# Patient Record
Sex: Male | Born: 1950 | Race: White | Hispanic: No | Marital: Married | Smoking: Never smoker
Health system: Southern US, Community
[De-identification: ages and names within clinical notes are randomized; demographics above are authoritative.]

## PROBLEM LIST (undated history)

## (undated) DIAGNOSIS — T4145XA Adverse effect of unspecified anesthetic, initial encounter: Secondary | ICD-10-CM

## (undated) DIAGNOSIS — M5136 Other intervertebral disc degeneration, lumbar region: Secondary | ICD-10-CM

## (undated) DIAGNOSIS — M545 Low back pain, unspecified: Secondary | ICD-10-CM

## (undated) DIAGNOSIS — I2510 Atherosclerotic heart disease of native coronary artery without angina pectoris: Secondary | ICD-10-CM

## (undated) DIAGNOSIS — M51369 Other intervertebral disc degeneration, lumbar region without mention of lumbar back pain or lower extremity pain: Secondary | ICD-10-CM

## (undated) DIAGNOSIS — T8859XA Other complications of anesthesia, initial encounter: Secondary | ICD-10-CM

## (undated) DIAGNOSIS — R51 Headache: Secondary | ICD-10-CM

## (undated) DIAGNOSIS — R519 Headache, unspecified: Secondary | ICD-10-CM

## (undated) DIAGNOSIS — E785 Hyperlipidemia, unspecified: Secondary | ICD-10-CM

## (undated) DIAGNOSIS — I10 Essential (primary) hypertension: Secondary | ICD-10-CM

## (undated) HISTORY — PX: LUMBAR LAMINECTOMY: SHX95

## (undated) HISTORY — PX: BACK SURGERY: SHX140

---

## 1999-09-02 ENCOUNTER — Ambulatory Visit: Admission: RE | Admit: 1999-09-02 | Discharge: 1999-09-02 | Payer: Self-pay | Admitting: Family Medicine

## 1999-09-05 ENCOUNTER — Encounter: Payer: Self-pay | Admitting: Family Medicine

## 1999-09-05 ENCOUNTER — Ambulatory Visit (HOSPITAL_COMMUNITY): Admission: RE | Admit: 1999-09-05 | Discharge: 1999-09-05 | Payer: Self-pay | Admitting: Family Medicine

## 2007-02-01 HISTORY — PX: SHOULDER ARTHROSCOPY: SHX128

## 2007-02-07 ENCOUNTER — Ambulatory Visit (HOSPITAL_BASED_OUTPATIENT_CLINIC_OR_DEPARTMENT_OTHER): Admission: RE | Admit: 2007-02-07 | Discharge: 2007-02-07 | Payer: Self-pay | Admitting: Orthopedic Surgery

## 2010-11-15 NOTE — Op Note (Signed)
Jesse Hodges, Jesse Hodges                ACCOUNT NO.:  [id number]   MEDICAL RECORD NO.:  [id number]          PATIENT TYPE:  AMB   LOCATION:  DSC                          FACILITY:  [facility]   PHYSICIAN:  Katy Fitch. Sypher, M.D. DATE OF BIRTH:  Dec 16, 1950   DATE OF PROCEDURE:  02/07/2007  DATE OF DISCHARGE:                               OPERATIVE REPORT   PREOPERATIVE DIAGNOSIS:  Chronic adhesive capsulitis, right shoulder,  following injury eight months prior failing to respond to nonoperative  measures including physical therapy, anti-inflammatory medication, and  steroid injection.   POSTOPERATIVE DIAGNOSIS:  Chronic adhesive capsulitis, right shoulder,  following injury eight months prior failing to respond to nonoperative  measures including physical therapy, anti-inflammatory medication, and  steroid injection.   OPERATION:  1. Examination of right shoulder under anesthesia followed by      arthroscopic evaluation of glenohumeral joint with debridement of      extensive granulation tissue from the glenohumeral joint with      tenolysis of the subscapularis tendon and anterior glenohumeral      capsular ligaments.  2. Subacromial decompression with bursectomy, coracoacromial ligament      relaxation, and acromioplasty.  3. Arthroscopic resection of distal clavicle.   SURGEON:  Katy Fitch. Sypher, M.D.   ASSISTANT:  Marveen Reeks. Dasnoit, P.A.-C.   ANESTHESIA:  General endotracheal anesthesia supplemented by right  interscalene block.   SUPERVISING ANESTHESIOLOGIST:  Janetta Hora. Gelene Mink, M.D.   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  He had a history of straining  his shoulder in December 2007 and developing rather acute pain and  gradual loss of range of motion.  He presented in the spring of 2008 for  evaluation of this predicament and was  enrolled in a therapy program.  He had tried a steroid injection and anti-inflammatory medication  including Celebrex without relief.  He had an intercurrent illness that  made it challenging for him to attend therapy and ultimately he decided  he would prefer to proceed with more aggressive treatment as he did not  feel that therapy was gaining success.   After a detailed informed consent, he is brought to the operating at  this time anticipating arthroscopic evaluation of the right shoulder,  anticipating examination of the shoulder under anesthesia, followed by  arthroscopic debridement of the glenohumeral joint, tenolysis of the  rotator cuff and capsular ligaments, and appropriate subacromial  decompression.  Preoperative x-rays revealed AC degenerative arthritis  and we anticipated resection of the distal clavicle.   PROCEDURE:  Jesse Hodges is brought to the operating room and placed in  the supine position on the operating table.  Following an anesthesia  consult with Dr. Gelene Mink in the holding area, an interscalene block  was placed on the right.  Excellent anesthesia of the right arm and  forequarter were obtained.  He was transferred to room 8 and placed in  the supine position on the operating table and under  Dr. Thornton Dales  direct supervision, general endotracheal anesthesia induced.  He was  carefully positioned in the beach chair position with aid of a torso and  head holder designed for shoulder arthroscopy.  The right arm and  forequarter were prepped with DuraPrep and draped with impervious  arthroscopy drapes.   Examination of the shoulder under anesthesia revealed initial range of  motion elevation combined 140 degrees, external rotation 70 degrees,  internal rotation 20 degrees. After gentle manipulation, we increased  the range of motion to elevation 165 degrees, external rotation of 90  degrees, internal rotation of 70 degrees.   The scope was introduced in  a standard posterior viewing portal.  Abundant adhesive capsulitis granulation tissues were visualized and  documented with the digital camera.  The subscapularis was bound down  and the interval between the biceps and subscapularis was obliterated by  granulation tissue.  The origin of the biceps at the superior labrum was  noted to be intact.  Hyaline articular cartilage surfaces of the glenoid  and humeral head were intact.  The deep surface of the rotator cuff  insertion was normal.  The biceps tendon was normal to the rotator  interval.  A 4.5 mm suction shaver was introduced anteriorly and used to  thoroughly debride the granulation tissue off the rotator cuff.  Hemostasis was achieved with bipolar cautery.   After completion of the intra-articular debridement, the arthroscopic  equipment was removed and placed in the subacromial space.  Through a  posterior viewing portal, the bursa was noted to be hypertrophic and  fibrotic.  After extensive bursectomy, the anatomy of the coracoacromial  arch was studied.  There was a prominent anterolateral acromial spur and  a prominent coracoacromial ligament.   The coracoacromial ligament was released with cutting cautery followed  by debridement of the hypertrophic bursa and tenolysis of the rotator  cuff.  The capsule of the [facility] joint was inspected and found to be  violated inferiorly.  The distal clavicle was prominent and arthritic.  Photographic documentation of the distal clavicle arthritis was  accomplished with a digital camera followed by resection of the distal  15 mm clavicle utilizing an arthroscopic bur from the anterior approach.  After hemostasis was achieved, complete tenolysis of the cuff was  accomplished.  There was no sign of full thickness retracted rotator  cuff tear.   Following completion of the decompression, the arthroscopic portals were  repaired with mattress suture of 3-0 Prolene.  A compressive dressing  was  applied with sterile gauze and paper tape.  There were no apparent  complications.   For aftercare, Jesse Hodges will be discharged to home.  He will return to  our office for follow up in 24 hours to initiate an active range of  motion exercise program.  For aftercare, he is provided a prescription  for Dilaudid 2 mg, 1-2 tablets p.o. q.4-6h. p.r.n. pain, 30 tablets  without refill, Motrin 600 mg one p.o. q.6h. p.r.n. pain with food, and  Keflex 500 mg one p.o. q.8h. x4 days as a prophylactic antibiotic.      Katy Fitch Sypher, M.D.  Electronically Signed     RVS/MEDQ  D:  02/07/2007  T:  02/07/2007  Job:  [id number]   cc:   Chales Salmon. Abigail Miyamoto, M.D.

## 2011-04-17 LAB — POCT HEMOGLOBIN-HEMACUE
Hemoglobin: 16.8
Operator id: 112821

## 2014-07-03 DIAGNOSIS — I2510 Atherosclerotic heart disease of native coronary artery without angina pectoris: Secondary | ICD-10-CM

## 2014-07-03 HISTORY — DX: Atherosclerotic heart disease of native coronary artery without angina pectoris: I25.10

## 2014-07-16 ENCOUNTER — Encounter (HOSPITAL_COMMUNITY): Payer: Self-pay | Admitting: Cardiology

## 2014-07-16 DIAGNOSIS — E785 Hyperlipidemia, unspecified: Secondary | ICD-10-CM

## 2014-07-16 DIAGNOSIS — I2089 Other forms of angina pectoris: Secondary | ICD-10-CM | POA: Diagnosis present

## 2014-07-16 DIAGNOSIS — I208 Other forms of angina pectoris: Secondary | ICD-10-CM | POA: Diagnosis present

## 2014-07-16 DIAGNOSIS — R9430 Abnormal result of cardiovascular function study, unspecified: Secondary | ICD-10-CM | POA: Diagnosis present

## 2014-07-16 HISTORY — DX: Hyperlipidemia, unspecified: E78.5

## 2014-07-16 MED ORDER — SODIUM CHLORIDE 0.9 % IJ SOLN
3.0000 mL | INTRAMUSCULAR | Status: DC | PRN
Start: 2014-07-16 — End: 2014-07-17

## 2014-07-16 MED ORDER — SODIUM CHLORIDE 0.9 % IV SOLN
INTRAVENOUS | Status: DC
  Administered 2014-07-17: 11:00:00 via INTRAVENOUS

## 2014-07-16 MED ORDER — ASPIRIN 81 MG PO CHEW: 81.0000 mg | CHEWABLE_TABLET | ORAL | Status: DC

## 2014-07-16 MED ORDER — SODIUM CHLORIDE 0.9 % IJ SOLN: 3.0000 mL | Freq: Two times a day (BID) | INTRAMUSCULAR | Status: DC

## 2014-07-16 MED ORDER — SODIUM CHLORIDE 0.9 % IV SOLN: 250.0000 mL | INTRAVENOUS | Status: DC | PRN

## 2014-07-16 NOTE — H&P (Signed)
Jesse Hodges  Date of visit:  07/16/2014 DOB:  Apr 02, 1951    Age:  64 yrs. Medical record number:  [id number]     Account number:  [id number] Primary Care Provider: Beverley Fiedler ____________________________ CURRENT DIAGNOSES  1. Chest pain  2. Abnormal result of cardiovascular function study, unspecified  3. Encounter for preprocedural cardiovascular examination  4. Hyperlipidemia ____________________________ ALLERGIES  No Known Allergies ____________________________ MEDICATIONS  1. ibuprofen 200 mg tablet, PRN  2. aspirin 81 mg chewable tablet, 1 p.o. daily ____________________________ HISTORY OF PRESENT ILLNESS Patient is seen for preoperative evaluation prior to the catheterization being done for an abnormal treadmill. This very nice 64 year old male was seen at the request of Dr. Barbaraann Barthel for evaluation of chest discomfort. He has been in good health except for a history of hyperlipidemia predominantly with high triglycerides. He was started on gemfibrozil shortly before Thanksgiving and began to notice episodic discomfort in his chest when he would exert himself at work as well as shortness of breath when walking. He stopped taking the gemfibrozil and feels as if things have gotten better but still has some mild discomfort when he exerts himself. He denies PND, orthopnea or edema. Occasionally he will have some ill described chest discomfort that is described as tightness that occurs at rest that lasts a few seconds. He does not have any family history of heart disease.  He underwent an exercise treadmill test today that was stopped because of angina and ST depression at 5 minutes and 46 seconds into Bruce stage II. He had significant T-wave changes in recovery that resolved. He was started on metoprolol and scheduled for catheterization. ____________________________ PAST HISTORY  Past Medical Illnesses:  hyperlipidemia;  Cardiovascular Illnesses:  no previous history of cardiac  disease.;  Surgical Procedures:  laminectomy lumbar, shoulder repair-rt;  NYHA Classification:  I;  Canadian Angina Classification:  Class 0: Asymptomatic;  Cardiology Procedures-Invasive:  no history of prior cardiac procedures;  Cardiology Procedures-Noninvasive:  treadmill;  LVEF not documented,   ____________________________ CARDIO-PULMONARY TEST DATES EKG Date:  07/13/2014;   ____________________________ FAMILY HISTORY Brother -- Brother alive and well Brother -- Brother alive and well Father -- Father alive and well Mother -- Mother dead, Heart failure, Congestive heart failure Sister -- Sister alive and well Sister -- Sister alive with problem, Leukemia ____________________________ SOCIAL HISTORY Alcohol Use:  occasionally and wine;  Smoking:  never smoked;  Diet:  regular diet;  Lifestyle:  married;  Exercise:  farm work;  Occupation:  Lexicographer for [employer];  Residence:  lives with wife;   ____________________________ REVIEW OF SYSTEMS General:  denies recent weight change, fatique or change in exercise tolerance.  Integumentary:no rashes or new skin lesions. Eyes: denies diplopia, history of glaucoma or visual problems. Ears, Nose, Throat, Mouth:  denies any hearing loss, epistaxis, hoarseness or difficulty speaking. Respiratory: denies dyspnea, cough, wheezing or hemoptysis. Cardiovascular:  please review HPI Abdominal: denies dyspepsia, GI bleeding, constipation, or diarrhea Genitourinary-Male: nocturia  Musculoskeletal:  denies arthritis, venous insufficiency, or muscle weakness Neurological:  denies headaches, stroke, or TIA Psychiatric:  insomnia  ____________________________ PHYSICAL EXAMINATION VITAL SIGNS  Blood Pressure:  124/80 Sitting, Left arm, regular cuff   Pulse:  64/min. Weight:  191.00 lbs. Height:  73"BMI: 25  Constitutional:  pleasant white male in no acute distress Skin:  warm and dry to touch, no apparent skin lesions, or masses noted. Head:   normocephalic, normal hair pattern, no masses or tenderness Eyes:  EOMS Intact, PERRLA, C and  S clear, Funduscopic exam not done. ENT:  ears, nose and throat reveal no gross abnormalities.  Dentition good. Neck:  supple, without massess. No JVD, thyromegaly or carotid bruits. Carotid upstroke normal. Chest:  normal symmetry, clear to auscultation. Cardiac:  regular rhythm, normal S1 and S2, No S3 or S4, no murmurs, gallops or rubs detected. Abdomen:  abdomen soft,non-tender, no masses, no hepatospenomegaly, or aneurysm noted Peripheral Pulses:  the femoral,dorsalis pedis, and posterior tibial pulses are full and equal bilaterally with no bruits auscultated. Extremities & Back:  no deformities, clubbing, cyanosis, erythema or edema observed. Normal muscle strength and tone. Neurological:  no gross motor or sensory deficits noted, affect appropriate, oriented x3. ____________________________ MOST RECENT LIPID PANEL 07/08/14  CHOL TOTL 159 mg/dl, LDL 59 NM, HDL 26 mg/dl, TRIGLYCER 865413 mg/dl,  CHOL/HDL 6.2 (Calc)  ____________________________ IMPRESSIONS/PLAN  1. Chest discomfort consistent with new onset of angina and progressive angina 2. Abnormal cardiovascular function tests 3. Hyperlipidemia  Recommendations:  Plan cardiac catheterization tomorrow. Cardiac catheterization was discussed with the patient including risks of myocardial infarction, death, stroke, bleeding, arrhythmia, dye allergy, or renal insufficiency. He understands and is willing to proceed. Possibility of percutaneous intervention at the same setting was also discussed with the patient including risks. ____________________________ TODAYS ORDERS  1. Chest X-ray PA/Lat: today  2. Complete Blood Count: Today  3. Draw PT/INR: Today  4. PTT: Today  5. Left Heart Cath/Poss PCI: 1 day                       ____________________________ Cardiology Physician:  Darden PalmerW. Spencer Shawntez Dickison, Jr. MD [facility]

## 2014-07-17 ENCOUNTER — Ambulatory Visit (HOSPITAL_COMMUNITY)
Admission: RE | Admit: 2014-07-17 | Discharge: 2014-07-19 | Disposition: A | Discharge status: Home / Self Care | Payer: Federal, State, Local not specified - PPO | Source: Ambulatory Visit | Attending: Cardiology | Admitting: Cardiology

## 2014-07-17 ENCOUNTER — Encounter (HOSPITAL_COMMUNITY): Payer: Self-pay | Admitting: *Deleted

## 2014-07-17 ENCOUNTER — Encounter (HOSPITAL_COMMUNITY)
Admission: RE | Disposition: A | Discharge status: Home / Self Care | Payer: Self-pay | Source: Ambulatory Visit | Attending: Cardiology

## 2014-07-17 DIAGNOSIS — I2584 Coronary atherosclerosis due to calcified coronary lesion: Secondary | ICD-10-CM | POA: Diagnosis not present

## 2014-07-17 DIAGNOSIS — Z791 Long term (current) use of non-steroidal anti-inflammatories (NSAID): Secondary | ICD-10-CM | POA: Insufficient documentation

## 2014-07-17 DIAGNOSIS — I772 Rupture of artery: Secondary | ICD-10-CM | POA: Diagnosis not present

## 2014-07-17 DIAGNOSIS — I25119 Atherosclerotic heart disease of native coronary artery with unspecified angina pectoris: Secondary | ICD-10-CM

## 2014-07-17 DIAGNOSIS — E785 Hyperlipidemia, unspecified: Secondary | ICD-10-CM | POA: Diagnosis present

## 2014-07-17 DIAGNOSIS — I2510 Atherosclerotic heart disease of native coronary artery without angina pectoris: Secondary | ICD-10-CM | POA: Diagnosis present

## 2014-07-17 DIAGNOSIS — R9430 Abnormal result of cardiovascular function study, unspecified: Secondary | ICD-10-CM | POA: Diagnosis present

## 2014-07-17 DIAGNOSIS — I208 Other forms of angina pectoris: Secondary | ICD-10-CM | POA: Diagnosis present

## 2014-07-17 DIAGNOSIS — Z7982 Long term (current) use of aspirin: Secondary | ICD-10-CM | POA: Insufficient documentation

## 2014-07-17 DIAGNOSIS — I25118 Atherosclerotic heart disease of native coronary artery with other forms of angina pectoris: Secondary | ICD-10-CM | POA: Diagnosis not present

## 2014-07-17 DIAGNOSIS — I200 Unstable angina: Secondary | ICD-10-CM | POA: Diagnosis present

## 2014-07-17 DIAGNOSIS — I2089 Other forms of angina pectoris: Secondary | ICD-10-CM | POA: Diagnosis present

## 2014-07-17 DIAGNOSIS — Z955 Presence of coronary angioplasty implant and graft: Secondary | ICD-10-CM

## 2014-07-17 HISTORY — PX: CORONARY ANGIOPLASTY WITH STENT PLACEMENT: SHX49

## 2014-07-17 HISTORY — PX: LEFT HEART CATHETERIZATION WITH CORONARY ANGIOGRAM: SHX5451

## 2014-07-17 HISTORY — DX: Hyperlipidemia, unspecified: E78.5

## 2014-07-17 HISTORY — DX: Atherosclerotic heart disease of native coronary artery without angina pectoris: I25.10

## 2014-07-17 LAB — POCT ACTIVATED CLOTTING TIME: Activated Clotting Time: 380 seconds

## 2014-07-17 LAB — TROPONIN I: TROPONIN I: 0.17 ng/mL — AB (ref <0.031)

## 2014-07-17 SURGERY — LEFT HEART CATHETERIZATION WITH CORONARY ANGIOGRAM
Anesthesia: LOCAL

## 2014-07-17 MED ORDER — ASPIRIN 81 MG PO CHEW: 81.0000 mg | CHEWABLE_TABLET | Freq: Every day | ORAL | Status: DC

## 2014-07-17 MED ORDER — FENTANYL CITRATE 0.05 MG/ML IJ SOLN
INTRAMUSCULAR | Status: AC
  Filled 2014-07-17: qty 2

## 2014-07-17 MED ORDER — TICAGRELOR 90 MG PO TABS: 90.0000 mg | ORAL_TABLET | Freq: Two times a day (BID) | ORAL | Status: DC

## 2014-07-17 MED ORDER — LIDOCAINE HCL (PF) 1 % IJ SOLN
INTRAMUSCULAR | Status: AC
Start: 2014-07-17 — End: 2014-07-17
  Filled 2014-07-17: qty 30

## 2014-07-17 MED ORDER — NITROGLYCERIN IN D5W 200-5 MCG/ML-% IV SOLN: 10.0000 ug/min | INTRAVENOUS | Status: AC

## 2014-07-17 MED ORDER — NITROGLYCERIN 1 MG/10 ML FOR IR/CATH LAB
INTRA_ARTERIAL | Status: AC
  Filled 2014-07-17: qty 10

## 2014-07-17 MED ORDER — TICAGRELOR 90 MG PO TABS
90.0000 mg | ORAL_TABLET | Freq: Two times a day (BID) | ORAL | Status: DC
  Administered 2014-07-18 – 2014-07-19 (×4): 90 mg via ORAL
  Filled 2014-07-17 (×6): qty 1

## 2014-07-17 MED ORDER — METOPROLOL SUCCINATE ER 25 MG PO TB24
25.0000 mg | ORAL_TABLET | Freq: Every day | ORAL | Status: DC
  Administered 2014-07-18 – 2014-07-19 (×2): 25 mg via ORAL
  Filled 2014-07-17 (×2): qty 1

## 2014-07-17 MED ORDER — OXYCODONE-ACETAMINOPHEN 5-325 MG PO TABS: 1.0000 | ORAL_TABLET | ORAL | Status: DC | PRN

## 2014-07-17 MED ORDER — ASPIRIN 81 MG PO CHEW
81.0000 mg | CHEWABLE_TABLET | Freq: Every day | ORAL | Status: DC
  Administered 2014-07-18 – 2014-07-19 (×2): 81 mg via ORAL
  Filled 2014-07-17 (×2): qty 1

## 2014-07-17 MED ORDER — NITROGLYCERIN IN D5W 200-5 MCG/ML-% IV SOLN
INTRAVENOUS | Status: AC
  Administered 2014-07-17: 18:00:00 10 ug
  Filled 2014-07-17: qty 250

## 2014-07-17 MED ORDER — ACETAMINOPHEN 325 MG PO TABS
650.0000 mg | ORAL_TABLET | ORAL | Status: DC | PRN
  Administered 2014-07-18: 650 mg via ORAL
  Filled 2014-07-17: qty 2

## 2014-07-17 MED ORDER — MIDAZOLAM HCL 2 MG/2ML IJ SOLN
INTRAMUSCULAR | Status: AC
  Filled 2014-07-17: qty 2

## 2014-07-17 MED ORDER — NITROGLYCERIN 0.4 MG SL SUBL: 0.4000 mg | SUBLINGUAL_TABLET | SUBLINGUAL | Status: DC | PRN

## 2014-07-17 MED ORDER — SODIUM CHLORIDE 0.9 % IV SOLN: INTRAVENOUS | Status: AC

## 2014-07-17 MED ORDER — BIVALIRUDIN 250 MG IV SOLR
INTRAVENOUS | Status: AC
  Filled 2014-07-17: qty 250

## 2014-07-17 MED ORDER — HEPARIN (PORCINE) IN NACL 2-0.9 UNIT/ML-% IJ SOLN
INTRAMUSCULAR | Status: AC
  Filled 2014-07-17: qty 1500

## 2014-07-17 MED ORDER — ONDANSETRON HCL 4 MG/2ML IJ SOLN: 4.0000 mg | Freq: Four times a day (QID) | INTRAMUSCULAR | Status: DC | PRN

## 2014-07-17 MED ORDER — ATORVASTATIN CALCIUM 40 MG PO TABS: 40.0000 mg | ORAL_TABLET | Freq: Every day | ORAL | Status: DC

## 2014-07-17 MED ORDER — TICAGRELOR 90 MG PO TABS
ORAL_TABLET | ORAL | Status: AC
  Filled 2014-07-17: qty 2

## 2014-07-17 NOTE — Progress Notes (Signed)
Patient with good result with stent done by Dr. Katrinka BlazingSmith earlier.  States he complained of mild chest discomfort late this afternoon that just started.  He looks comfortable.  Plan to get EKG.  Dr. Katrinka BlazingSmith started nitroglycerin just a few minutes ago.  Check troponins.  Darden PalmerW. Spencer Kerrick Miler, Jr. MD [facility]

## 2014-07-17 NOTE — Interval H&P Note (Signed)
History and Physical Interval Note:  07/17/2014 12:21 PM   Patient seen and examined.  No interval change in history and exam since last note.  Stable for procedure.  Darden PalmerW. Spencer Copper Kirtley, Jr. MD [facility]  07/17/2014

## 2014-07-17 NOTE — CV Procedure (Signed)
     PERCUTANEOUS CORONARY INTERVENTION   Jesse Hodges is a 64 y.o. male  INDICATION: High-grade proximal LAD with early positive exercise treadmill test in 4 week history of marked angina with minimal activity   PROCEDURE: 1.  Drug-eluting stent implantation proximal LAD; 2.  Vascade hemostasis device   CONSENT: The risks, benefits, and details of the procedure were explained to the patient. Risks including death, MI, stroke, bleeding, limb ischemia, emergency CABG, renal failure and allergy were described and accepted by the patient.  Informed written consent was obtained prior to proceeding.  PROCEDURE TECHNIQUE:  After Xylocaine anesthesia a 6 French sheath placed by Dr. Wynonia Lawman was used for the intervention..   Coronary guiding shots were made using a 35 XB LAD 6 [place] guide catheter. Antithrombotic therapy, bivalirudin bolus and infusion, was begun and determined to be therapeutic by ACT. Antiplatelet therapy, BRILINTA, was loaded.  A Cougar 0.014 wire was used to cross the stenosis in the proximal LAD.  Predilatation was performed with a 20 mm long by 2.5 mm balloon.  We then deployed a 28 x 3.5 mm Synergy drug-eluting stent at 14 atm.  We then postdilated with a 12 mm x 3.75 Leonard Emerge.  We finished with a 8 mm x 4.0 mm incision emerge at 14 atm.  A nice angiographic result was obtained.  No symptoms occurred.  TIMI grade 3 flow is noted post stenting.  There is some swelling of contrast in the mid LAD beyond the stent related to ectasia in the vessel.  A femoral groin shot was performed and the sheath was removed with hemostasis achieved using a Vascade closure device.  No immediate complications are noted.   EQUIPMENT:  Noted above  CONTRAST:  Total of  100 cc.  COMPLICATIONS:  None.    ANGIOGRAPHIC RESULTS:   Segmental eccentric 95% proximal to mid LAD stenosis reduced to 0% with TIMI grade 3 flow.  Following stenting using a 28 x 3.5 mm Synergy DES postdilated to 4.0  mm.   IMPRESSIONS:  Successful LAD stenting from 95% to 0% with TIMI grade 3 flow   RECOMMENDATION:  Aspirin and BRILINTA for at least a year Discharge in a.m. if stable Statin therapy Dr. Wynonia Lawman to manage patient and follow-up 7-14 days. Phase I cardiac rehabilitation.    Sinclair Grooms, MD 07/17/2014 1:32 PM

## 2014-07-17 NOTE — Discharge Summary (Signed)
Physician Discharge Summary  Patient ID: Jesse Hodges MRN: [id number] DOB/AGE: 63/02/1951 63 y.o.  Admit date: 07/17/2014 Discharge date: 07/17/2014  Primary Physician: Dr. Beverley FiedlerVictoria Rankins  Primary Discharge Diagnosis:  1.  Progressive angina pectoris  Secondary Discharge Diagnosis: 2.  Coronary artery disease with severe stenosis in proximal LAD. 3.  Dyslipidemia  Procedures:  Cardiac catheterization-Dr. Donnie Ahoilley. Percutaneous coronary intervention of the anterior descending with a drug-eluting stent by Dr. Alicia AmelSmith  Hospital Course: This 64 year old male presented with about a 4 week history of exertional substernal discomfort at work that has become progressive.  He initially thought it was due to gemfibrozil and stopped this, but has continued to have discomfort.  An exercise treadmill test done the day prior to admission shows that he only was able to walk and Bruce stage II, and had clinical angina as well as ST depression in the inferolateral leads.  He was brought in for catheterization.  Catheterization was done on an outpatient same day basis.  He had normal left ventricular function with minimal anterior hypokinesis noted.  The left main, right coronary and circumflex coronary arteries were normal.  The left anterior descending had a severe 95% proximal stenosis with some ulceration.  He underwent stenting with a 28 x 3.5 [place] G drug-eluting stent and was post dilated with a 12 mm x 3.75 mm noncompliant balloon.  It was eventually dilated to 4 mm.  Final result was a 0% residual stenosis.  He had some mild discomfort in the afternoon after the catheterization was placed on nitroglycerin.  He had some recurrent pain the next day associated with a mild elevation of his troponin.  Serial troponins were drawn and an EKG showed no changes.  There was very minimal elevation of the troponins, which remained flat.  He was treated with IV nitroglycerin and his symptoms eventually resolved.  He  was seen in consultation by cardiac rehabilitation and ambulated the patient.  The hall without recurrent pain.  He remained pain-free for 24 hours and will be discharged at this time.  It is postulated that he may have had sluggish flow in a side branch of the time of PCI to account for his symptoms.    Discharge Exam: Blood pressure 119/83, pulse 66, temperature 97.8 F (36.6 C), temperature source Oral, resp. rate 18, height 6\' 1"  (1.854 m), weight 86.183 kg (190 lb), SpO2 96 %. Weight: 86.183 kg (190 lb)  Cath site clean and dry, lungs clear  EKG: Normal  Discharge Medications:   Medication List    TAKE these medications        aspirin 81 MG tablet  Take 81 mg by mouth daily.     atorvastatin 40 MG tablet  Commonly known as:  LIPITOR  Take 1 tablet (40 mg total) by mouth daily.     FISH OIL PO  Take 1 capsule by mouth 2 (two) times a week.     metoprolol succinate 25 MG 24 hr tablet  Commonly known as:  TOPROL-XL  Take 1 tablet by mouth daily.     NITROSTAT 0.4 MG SL tablet  Generic drug:  nitroGLYCERIN  Place 0.4 mg under the tongue every 5 (five) minutes as needed for chest pain.     ticagrelor 90 MG Tabs tablet  Commonly known as:  BRILINTA  Take 1 tablet (90 mg total) by mouth 2 (two) times daily.     VITAMIN C PO  Take 1 tablet by mouth 2 (two) times a week.  Followup plans and appointments: Follow up Dr. Donnie Aho in one week.  Time spent with patient to include physician time:  30 minutes   Signed: Darden Palmer. MD [facility] 07/17/2014, 6:21 PM

## 2014-07-17 NOTE — CV Procedure (Signed)
Cardiac Catheterization Report   Jesse Hodges    64 y.o.  male  DOB: 07/31/1950  MRN: [id number]  07/17/2014    PROCEDURE:  Left heart catheterization with selective coronary angiography, left ventriculogram.  INDICATIONS:  New onset of angina with moderate limitation of physical activity with an abnormal treadmill test at Bruce stage II  The risks, benefits, and details of the procedure were explained to the patient.  The patient verbalized understanding and wanted to proceed.  Informed written consent was obtained.  PROCEDURE TECHNIQUE:  After Xylocaine anesthesia a 75F sheath was placed in the right femoral artery with a single anterior needle wall stick.   Left coronary angiography was done using a Judkins L4 guide catheter.  Right coronary angiography was done using a Judkins R4 guide catheter.  A 30 cc ventriculogram was performed in the 30 degree RAO projection.  Tolerated the procedure well.  The digital images were reviewed while the patient was on the table and the interventional cardiologist was consulted.  Arrangements were made to proceed with PCI of the LAD.   CONTRAST:  Total of 60 cc.  ESTIMATED BLOOD LOSS:  Minimal   COMPLICATIONS:  None.    HEMODYNAMICS:  Aortic postcontrast 140/80, LV postcontrast 140 over 2-10.  There was no gradient between the left ventricle and aorta.    ANGIOGRAPHIC DATA:    CORONARY ARTERIES:   Arise and distribute normally.  Right dominant.  Mild coronary calcification is noted.  Left main coronary artery: Normal.  Left anterior descending: Severe somewhat segmental 90% stenosis in the proximal left anterior descending with some ulceration.  Distal vessel is large and free of disease  Circumflex coronary artery: Normal.  Right coronary artery: Dominant vessel which is normal.  LEFT VENTRICULOGRAM:  Performed in the 30 RAO projection.  The aortic and mitral valves are normal. The left ventricle is  normal in size with very minimal anterior hypokinesis. Estimated ejection fraction is 60%..   IMPRESSIONS:  1. Severe single-vessel coronary artery disease affecting the proximal left anterior descending artery 2. Normal left ventricle with very minimal anterior hypokinesis  RECOMMENDATION:  Plan to proceed with PCI of the LAD with a drug-eluting stent per Dr. Katrinka BlazingSmith.Jesse Hodges.   Jesse Hodges, Jr. MD [facility]

## 2014-07-18 DIAGNOSIS — E785 Hyperlipidemia, unspecified: Secondary | ICD-10-CM | POA: Diagnosis not present

## 2014-07-18 DIAGNOSIS — I772 Rupture of artery: Secondary | ICD-10-CM | POA: Diagnosis not present

## 2014-07-18 DIAGNOSIS — I2584 Coronary atherosclerosis due to calcified coronary lesion: Secondary | ICD-10-CM | POA: Diagnosis not present

## 2014-07-18 DIAGNOSIS — I25118 Atherosclerotic heart disease of native coronary artery with other forms of angina pectoris: Secondary | ICD-10-CM | POA: Diagnosis not present

## 2014-07-18 LAB — TROPONIN I
TROPONIN I: 0.67 ng/mL — AB (ref <0.031)
TROPONIN I: 0.71 ng/mL — AB (ref <0.031)
TROPONIN I: 0.62 ng/mL — AB (ref <0.031)

## 2014-07-18 LAB — BASIC METABOLIC PANEL
Anion gap: 7 (ref 5–15)
BUN: 13 mg/dL (ref 6–23)
CHLORIDE: 103 meq/L (ref 96–112)
CO2: 25 mmol/L (ref 19–32)
Calcium: 8.9 mg/dL (ref 8.4–10.5)
Creatinine, Ser: 0.93 mg/dL (ref 0.50–1.35)
GFR, EST NON AFRICAN AMERICAN: 88 mL/min — AB (ref >90)
Glucose, Bld: 104 mg/dL — ABNORMAL HIGH (ref 70–99)
Potassium: 4.1 mmol/L (ref 3.5–5.1)
SODIUM: 135 mmol/L (ref 135–145)

## 2014-07-18 LAB — CBC
HEMATOCRIT: 40.6 % (ref 39.0–52.0)
HEMOGLOBIN: 14.5 g/dL (ref 13.0–17.0)
MCH: 32.2 pg (ref 26.0–34.0)
MCHC: 35.7 g/dL (ref 30.0–36.0)
MCV: 90.2 fL (ref 78.0–100.0)
Platelets: 181 10*3/uL (ref 150–400)
RBC: 4.5 MIL/uL (ref 4.22–5.81)
RDW: 12.7 % (ref 11.5–15.5)
WBC: 7.9 10*3/uL (ref 4.0–10.5)

## 2014-07-18 MED ORDER — ATORVASTATIN CALCIUM 80 MG PO TABS
80.0000 mg | ORAL_TABLET | Freq: Every day | ORAL | Status: DC
  Administered 2014-07-18: 80 mg via ORAL
  Filled 2014-07-18 (×2): qty 1

## 2014-07-18 NOTE — Progress Notes (Signed)
CRITICAL VALUE ALERT  Critical value received:  Troponin 0.67  Date of notification:  07/18/2014  Time of notification:  02:30  Critical value read back:Yes.    Nurse who received alert:  Toney Sangeresa Zinia Innocent RN  MD notified (1st page):  Dr. Casper HarrisonWasqas Qureshi  Time of first page:  02:40  MD notified (2nd page):  Time of second page:  Responding MD:    Time MD responded: 02:44

## 2014-07-18 NOTE — Progress Notes (Signed)
Patient ID: Jesse Hodges, male   DOB: 07/31/1950, 64 y.o.   MRN: [id number]    Subjective:    His discomfort around 6 PM last night improved, but he had recurrent chest discomfort around 3 AM.  There was slight elevation of troponin.  Also complained of slight temperature elevation around the end of the evening.  He feels fine this morning.  No EKG changes are noted today.  Objective:   Temp:  [97.6 F (36.4 C)-99.1 F (37.3 C)] 98.1 F (36.7 C) (01/16 0400) Pulse Rate:  [61-87] 77 (01/16 0400) Resp:  [18-20] 20 (01/16 0400) BP: (104-144)/(73-95) 112/80 mmHg (01/16 0500) SpO2:  [95 %-99 %] 96 % (01/16 0400) Weight:  [190 lb (86.183 kg)-191 lb 9.3 oz (86.9 kg)] 191 lb 9.3 oz (86.9 kg) (01/16 0500) Last BM Date: 07/16/14    Intake/Output Summary (Last 24 hours) at 07/18/14 0736 Last data filed at 07/18/14 0600  Gross per 24 hour  Intake 513.05 ml  Output      2 ml  Net 511.05 ml    Telemetry: Sinus rhythm  Exam:   Lungs: Clear Cardiac: Normal S1, S2, no S3. Cath site is clean and dry  Lab Results:  Basic Metabolic Panel:  Recent Labs Lab 07/18/14 0120  NA 135  K 4.1  CL 103  CO2 25  GLUCOSE 104*  BUN 13  CREATININE 0.93  CALCIUM 8.9   CBC:  Recent Labs Lab 07/18/14 0120  WBC 7.9  HGB 14.5  HCT 40.6  MCV 90.2  PLT 181    Cardiac Enzymes:  Recent Labs Lab 07/17/14 1900 07/18/14 0120  TROPONINI 0.17* 0.67*   ECG:   Medications:   Scheduled Medications: . aspirin  81 mg Oral Daily  . metoprolol succinate  25 mg Oral Daily  . ticagrelor  90 mg Oral BID  PRN Medications:  acetaminophen, nitroGLYCERIN, ondansetron (ZOFRAN) IV, oxyCODONE-acetaminophen   07/17/14 Cath HEMODYNAMICS: Aortic postcontrast 140/80, LV postcontrast 140 over 2-10. There was no gradient between the left ventricle and aorta.   ANGIOGRAPHIC DATA:   CORONARY ARTERIES: Arise and distribute normally. Right dominant. Mild coronary calcification is noted.  Left  main coronary artery: Normal.  Left anterior descending: Severe somewhat segmental 90% stenosis in the proximal left anterior descending with some ulceration. Distal vessel is large and free of disease  Circumflex coronary artery: Normal.  Right coronary artery: Dominant vessel which is normal.  LEFT VENTRICULOGRAM: Performed in the 30 RAO projection. The aortic and mitral valves are normal. The left ventricle is normal in size with very minimal anterior hypokinesis. Estimated ejection fraction is 60%..   IMPRESSIONS:  1. Severe single-vessel coronary artery disease affecting the proximal left anterior descending artery 2. Normal left ventricle with very minimal anterior hypokinesis  RECOMMENDATION: Plan to proceed with PCI of the LAD with a drug-eluting stent per Dr. Katrinka BlazingSmith..   ANGIOGRAPHIC RESULTS: Segmental eccentric 95% proximal to mid LAD stenosis reduced to 0% with TIMI grade 3 flow. Following stenting using a 28 x 3.5 mm Synergy DES postdilated to 4.0 mm.   IMPRESSIONS: Successful LAD stenting from 95% to 0% with TIMI grade 3 flow   RECOMMENDATION: Aspirin and BRILINTA for at least a year   Assessment/Plan    1. CAD - s/p stent to DES to LAD yesterday - post cath labs Hgb 14.5, Cr 0.93. EKG SR with no ischemic changes - had some post procedure chest pain, trop trended to 0.67 overnight without peak.  - on  ASA, toprol 25, brilinta  bid. Start high dose statin.  2.  Hyperlipidemia.  Recommendations:  His discomfort was somewhat concerning post cath.  Will review the films again wonder could he have had a very small side branch  sluggish flow or possibly small side branch occlusion.  We'll keep an additional day and get additional troponins.  Check EKG in the morning.  Darden Palmer. MD [facility] 07/18/2014 7:57 AM

## 2014-07-18 NOTE — Progress Notes (Cosign Needed)
CARDIAC REHAB PHASE I   PRE:  Rate/Rhythm: 74 sinus rhythm  BP:  Supine:    Sitting: 122/80  Standing:    SaO2: 97% ra  MODE:  Ambulation: 550 ft   POST:  Rate/Rhythem: 76 sinus rhythm  BP:  Supine:   Sitting: 120/80  Standing:    SaO2: 98% ra 1046-1115 Pt ambulated in hallways without difficulty, steady gait, asymptomatic.  Pt denies chest pain with ambulation.   Education completed including risk factor modification, low fat-low cholesterol diet, exercise, and medication compliance.  Pt oriented to outpatient cardiac rehab.  [facility]   Pt declined at this time, plans to exercise on his own.   Understanding verbalized     Jesse Hodges, Jesse Hodges

## 2014-07-19 DIAGNOSIS — I25118 Atherosclerotic heart disease of native coronary artery with other forms of angina pectoris: Secondary | ICD-10-CM | POA: Diagnosis not present

## 2014-07-19 LAB — TROPONIN I: Troponin I: 0.6 ng/mL (ref <0.031)

## 2014-07-19 MED ORDER — TICAGRELOR 90 MG PO TABS: 90.0000 mg | ORAL_TABLET | Freq: Two times a day (BID) | ORAL | Status: DC

## 2014-07-19 MED ORDER — ATORVASTATIN CALCIUM 40 MG PO TABS: 40.0000 mg | ORAL_TABLET | Freq: Every day | ORAL | Status: DC

## 2014-07-19 NOTE — Progress Notes (Signed)
Patient ID: Jesse Hodges, male   DOB: 03/13/1951, 64 y.o.   MRN: [id number]    Subjective:    He feels fine today.  No recurrent pain and was able to ambulate.  Objective:   Temp:  [98.2 F (36.8 C)-98.5 F (36.9 C)] 98.2 F (36.8 C) (01/17 21300633) Pulse Rate:  [70-84] 70 (01/17 0633) Resp:  [18] 18 (01/17 0633) BP: (117-141)/(75-81) 117/78 mmHg (01/17 0633) SpO2:  [95 %-98 %] 98 % (01/17 0633) Last BM Date: 07/18/14  Telemetry: Sinus rhythm  Exam: Pleasant male in no acute distress  Lungs: Clear Cardiac: Normal S1, S2, no S3. Cath site is clean and dry  Lab Results:  Basic Metabolic Panel:  Recent Labs Lab 07/18/14 0120  NA 135  K 4.1  CL 103  CO2 25  GLUCOSE 104*  BUN 13  CREATININE 0.93  CALCIUM 8.9   CBC:  Recent Labs Lab 07/18/14 0120  WBC 7.9  HGB 14.5  HCT 40.6  MCV 90.2  PLT 181    Cardiac Enzymes:  Recent Labs Lab 07/18/14 1137 07/18/14 1800 07/18/14 2352  TROPONINI 0.71* 0.62* 0.60*   ECG:   Medications:   Scheduled Medications: . aspirin  81 mg Oral Daily  . atorvastatin  80 mg Oral q1800  . metoprolol succinate  25 mg Oral Daily  . ticagrelor  90 mg Oral BID  PRN Medications: acetaminophen, nitroGLYCERIN, oxyCODONE-acetaminophen   Assessment/Plan    1. CAD - s/p stent to DES to LAD yesterday - post cath labs Hgb 14.5, Cr 0.93. EKG SR with no ischemic changes - had some post procedure chest pain, trop trended to 0.67 overnight without peak.  - on ASA, toprol 25, brilinta 90mg  bid. Start high dose statin.  2.  Hyperlipidemia.  Recommendations:  clinically doing much better and will plan for him to go on home today.  Follow-up in one week.  Call if problems.  Darden PalmerW. Spencer Tilley, Jr. MD [facility] 07/19/2014 10:19 AM

## 2014-07-20 MED FILL — Sodium Chloride IV Soln 0.9%: INTRAVENOUS | Qty: 50 | Status: AC

## 2014-07-20 NOTE — Care Management Note (Signed)
    Page 1 of 1   07/20/2014     12:18:35 PM CARE MANAGEMENT NOTE 07/20/2014  Patient:  Jesse Hodges,Jesse Hodges   Account Number:  [id number]  Date Initiated:  07/20/2014  Documentation initiated by:  Donato SchultzHUTCHINSON,Foy Vanduyne  Subjective/Objective Assessment:   Chest discomfort consistent with new onset of angina and progressive angina, Abnormal cardiovascular function tests, Hyperlipidemia     Action/Plan:   Heart Cath   Anticipated DC Date:  07/19/2014   Anticipated DC Plan:  HOME/SELF CARE         Choice offered to / List presented to:             Status of service:  Completed, signed off Medicare Important Message given?   (If response is "NO", the following Medicare IM given date fields will be blank) Date Medicare IM given:   Medicare IM given by:   Date Additional Medicare IM given:   Additional Medicare IM given by:    Discharge Disposition:  HOME/SELF CARE  Per UR Regulation:    If discussed at Long Length of Stay Meetings, dates discussed:    Comments:  Marria Mathison RN, BSN, MSHL, CCM  Nurse - Case Manager,  (Unit [phone]  07/20/2014 Heart cath - s/p stent to DES to LAD 07/17/2014 Benefits check: Brilinta in progress Insured:  BLUE CROSS BLUE SHIELD/BCBS/[employer] PPO

## 2018-01-28 ENCOUNTER — Other Ambulatory Visit: Payer: Self-pay

## 2018-01-28 ENCOUNTER — Emergency Department: Payer: Medicare Other

## 2018-01-28 ENCOUNTER — Inpatient Hospital Stay
Admission: EM | Admit: 2018-01-28 | Discharge: 2018-01-30 | DRG: 552 | Disposition: A | Discharge status: Other Acute Inpatient Hospital | Payer: Medicare Other | Attending: Internal Medicine | Admitting: Internal Medicine

## 2018-01-28 ENCOUNTER — Encounter: Payer: Self-pay | Admitting: Emergency Medicine

## 2018-01-28 DIAGNOSIS — Z8249 Family history of ischemic heart disease and other diseases of the circulatory system: Secondary | ICD-10-CM

## 2018-01-28 DIAGNOSIS — Z955 Presence of coronary angioplasty implant and graft: Secondary | ICD-10-CM

## 2018-01-28 DIAGNOSIS — R262 Difficulty in walking, not elsewhere classified: Secondary | ICD-10-CM | POA: Diagnosis present

## 2018-01-28 DIAGNOSIS — E785 Hyperlipidemia, unspecified: Secondary | ICD-10-CM | POA: Diagnosis present

## 2018-01-28 DIAGNOSIS — M545 Low back pain, unspecified: Secondary | ICD-10-CM | POA: Diagnosis present

## 2018-01-28 DIAGNOSIS — M5417 Radiculopathy, lumbosacral region: Secondary | ICD-10-CM

## 2018-01-28 DIAGNOSIS — Z7982 Long term (current) use of aspirin: Secondary | ICD-10-CM | POA: Diagnosis not present

## 2018-01-28 DIAGNOSIS — M48061 Spinal stenosis, lumbar region without neurogenic claudication: Secondary | ICD-10-CM | POA: Diagnosis present

## 2018-01-28 DIAGNOSIS — M5116 Intervertebral disc disorders with radiculopathy, lumbar region: Secondary | ICD-10-CM | POA: Diagnosis present

## 2018-01-28 DIAGNOSIS — I10 Essential (primary) hypertension: Secondary | ICD-10-CM | POA: Diagnosis present

## 2018-01-28 DIAGNOSIS — G8929 Other chronic pain: Secondary | ICD-10-CM | POA: Diagnosis present

## 2018-01-28 DIAGNOSIS — Z7902 Long term (current) use of antithrombotics/antiplatelets: Secondary | ICD-10-CM

## 2018-01-28 DIAGNOSIS — M549 Dorsalgia, unspecified: Secondary | ICD-10-CM

## 2018-01-28 DIAGNOSIS — I2510 Atherosclerotic heart disease of native coronary artery without angina pectoris: Secondary | ICD-10-CM | POA: Diagnosis present

## 2018-01-28 DIAGNOSIS — R52 Pain, unspecified: Secondary | ICD-10-CM

## 2018-01-28 HISTORY — DX: Low back pain, unspecified: M54.50

## 2018-01-28 HISTORY — DX: Low back pain: M54.5

## 2018-01-28 HISTORY — DX: Other intervertebral disc degeneration, lumbar region without mention of lumbar back pain or lower extremity pain: M51.369

## 2018-01-28 HISTORY — DX: Other intervertebral disc degeneration, lumbar region: M51.36

## 2018-01-28 LAB — CBC WITH DIFFERENTIAL/PLATELET
BASOS ABS: 0 10*3/uL (ref 0–0.1)
BASOS PCT: 1 %
Eosinophils Absolute: 0 10*3/uL (ref 0–0.7)
Eosinophils Relative: 0 %
HCT: 42.8 % (ref 40.0–52.0)
HEMOGLOBIN: 15.2 g/dL (ref 13.0–18.0)
LYMPHS ABS: 1.1 10*3/uL (ref 1.0–3.6)
Lymphocytes Relative: 15 %
MCH: 34 pg (ref 26.0–34.0)
MCHC: 35.5 g/dL (ref 32.0–36.0)
MCV: 95.6 fL (ref 80.0–100.0)
Monocytes Absolute: 0.7 10*3/uL (ref 0.2–1.0)
Monocytes Relative: 9 %
NEUTROS PCT: 75 %
Neutro Abs: 5.7 10*3/uL (ref 1.4–6.5)
Platelets: 184 10*3/uL (ref 150–440)
RBC: 4.47 MIL/uL (ref 4.40–5.90)
RDW: 12.6 % (ref 11.5–14.5)
WBC: 7.6 10*3/uL (ref 3.8–10.6)

## 2018-01-28 LAB — BASIC METABOLIC PANEL
Anion gap: 7 (ref 5–15)
BUN: 20 mg/dL (ref 8–23)
CALCIUM: 9.5 mg/dL (ref 8.9–10.3)
CHLORIDE: 106 mmol/L (ref 98–111)
CO2: 27 mmol/L (ref 22–32)
Creatinine, Ser: 0.82 mg/dL (ref 0.61–1.24)
GFR calc Af Amer: >60 mL/min (ref >60)
GFR calc non Af Amer: >60 mL/min (ref >60)
Glucose, Bld: 127 mg/dL — ABNORMAL HIGH (ref 70–99)
Potassium: 4 mmol/L (ref 3.5–5.1)
Sodium: 140 mmol/L (ref 135–145)

## 2018-01-28 MED ORDER — MORPHINE SULFATE (PF) 4 MG/ML IV SOLN
4.0000 mg | Freq: Once | INTRAVENOUS | Status: AC
  Administered 2018-01-28: 4 mg via INTRAVENOUS
  Filled 2018-01-28: qty 1

## 2018-01-28 MED ORDER — POLYETHYLENE GLYCOL 3350 17 G PO PACK: 17.0000 g | PACK | Freq: Every day | ORAL | Status: DC | PRN

## 2018-01-28 MED ORDER — KETOROLAC TROMETHAMINE 30 MG/ML IJ SOLN
30.0000 mg | Freq: Once | INTRAMUSCULAR | Status: AC
  Administered 2018-01-28: 30 mg via INTRAMUSCULAR
  Filled 2018-01-28: qty 1

## 2018-01-28 MED ORDER — METHYLPREDNISOLONE SODIUM SUCC 125 MG IJ SOLR
125.0000 mg | Freq: Once | INTRAMUSCULAR | Status: AC
  Administered 2018-01-28: 125 mg via INTRAVENOUS
  Filled 2018-01-28: qty 2

## 2018-01-28 MED ORDER — FENTANYL CITRATE (PF) 100 MCG/2ML IJ SOLN
50.0000 ug | Freq: Once | INTRAMUSCULAR | Status: AC
  Administered 2018-01-28: 50 ug via INTRAVENOUS
  Filled 2018-01-28: qty 2

## 2018-01-28 MED ORDER — ASPIRIN EC 81 MG PO TBEC
81.0000 mg | DELAYED_RELEASE_TABLET | Freq: Every day | ORAL | Status: DC
  Administered 2018-01-29: 81 mg via ORAL
  Filled 2018-01-28: qty 1

## 2018-01-28 MED ORDER — ATORVASTATIN CALCIUM 20 MG PO TABS
40.0000 mg | ORAL_TABLET | Freq: Every day | ORAL | Status: DC
  Administered 2018-01-28 – 2018-01-29 (×2): 40 mg via ORAL
  Filled 2018-01-28 (×2): qty 2

## 2018-01-28 MED ORDER — MORPHINE SULFATE (PF) 2 MG/ML IV SOLN
2.0000 mg | INTRAVENOUS | Status: DC | PRN
  Administered 2018-01-28 – 2018-01-30 (×2): 2 mg via INTRAVENOUS
  Filled 2018-01-28 (×3): qty 1

## 2018-01-28 MED ORDER — ORPHENADRINE CITRATE 30 MG/ML IJ SOLN
60.0000 mg | Freq: Two times a day (BID) | INTRAMUSCULAR | Status: DC
  Administered 2018-01-28: 60 mg via INTRAMUSCULAR
  Filled 2018-01-28 (×2): qty 2

## 2018-01-28 MED ORDER — VITAMIN C 500 MG PO TABS
250.0000 mg | ORAL_TABLET | Freq: Every day | ORAL | Status: DC
  Administered 2018-01-29 – 2018-01-30 (×2): 250 mg via ORAL
  Filled 2018-01-28 (×2): qty 0.5

## 2018-01-28 MED ORDER — METHYLPREDNISOLONE 4 MG PO TBPK: 4.0000 mg | ORAL_TABLET | Freq: Four times a day (QID) | ORAL | Status: DC

## 2018-01-28 MED ORDER — METHYLPREDNISOLONE 4 MG PO TBPK
8.0000 mg | ORAL_TABLET | Freq: Every morning | ORAL | Status: AC
  Administered 2018-01-29: 8 mg via ORAL
  Filled 2018-01-28: qty 21

## 2018-01-28 MED ORDER — ONDANSETRON HCL 4 MG PO TABS: 4.0000 mg | ORAL_TABLET | Freq: Four times a day (QID) | ORAL | Status: DC | PRN

## 2018-01-28 MED ORDER — HYDROCODONE-ACETAMINOPHEN 5-325 MG PO TABS
1.0000 | ORAL_TABLET | ORAL | Status: DC | PRN
  Administered 2018-01-29 – 2018-01-30 (×3): 2 via ORAL
  Filled 2018-01-28 (×3): qty 2

## 2018-01-28 MED ORDER — OMEGA-3-ACID ETHYL ESTERS 1 G PO CAPS
1.0000 g | ORAL_CAPSULE | Freq: Every day | ORAL | Status: DC
Start: 2018-01-29 — End: 2018-01-30
  Administered 2018-01-29: 1 g via ORAL
  Filled 2018-01-28: qty 1

## 2018-01-28 MED ORDER — METHYLPREDNISOLONE 4 MG PO TBPK
4.0000 mg | ORAL_TABLET | Freq: Three times a day (TID) | ORAL | Status: DC
  Administered 2018-01-30 (×2): 4 mg via ORAL

## 2018-01-28 MED ORDER — TICAGRELOR 90 MG PO TABS
90.0000 mg | ORAL_TABLET | Freq: Two times a day (BID) | ORAL | Status: DC
  Administered 2018-01-29: 90 mg via ORAL
  Filled 2018-01-28 (×4): qty 1

## 2018-01-28 MED ORDER — METHYLPREDNISOLONE 4 MG PO TBPK
4.0000 mg | ORAL_TABLET | ORAL | Status: AC
  Administered 2018-01-29: 4 mg via ORAL

## 2018-01-28 MED ORDER — SODIUM CHLORIDE 0.9 % IV SOLN
INTRAVENOUS | Status: AC
  Administered 2018-01-28 – 2018-01-29 (×2): via INTRAVENOUS

## 2018-01-28 MED ORDER — DOCUSATE SODIUM 100 MG PO CAPS
100.0000 mg | ORAL_CAPSULE | Freq: Two times a day (BID) | ORAL | Status: DC
  Administered 2018-01-28 – 2018-01-29 (×3): 100 mg via ORAL
  Filled 2018-01-28 (×4): qty 1

## 2018-01-28 MED ORDER — HYDROCHLOROTHIAZIDE 12.5 MG PO CAPS
12.5000 mg | ORAL_CAPSULE | Freq: Every day | ORAL | Status: DC
  Administered 2018-01-29 – 2018-01-30 (×2): 12.5 mg via ORAL
  Filled 2018-01-28 (×2): qty 1

## 2018-01-28 MED ORDER — LOSARTAN POTASSIUM 50 MG PO TABS
50.0000 mg | ORAL_TABLET | Freq: Every day | ORAL | Status: DC
  Administered 2018-01-29 – 2018-01-30 (×2): 50 mg via ORAL
  Filled 2018-01-28 (×2): qty 1

## 2018-01-28 MED ORDER — ONDANSETRON HCL 4 MG/2ML IJ SOLN: 4.0000 mg | Freq: Four times a day (QID) | INTRAMUSCULAR | Status: DC | PRN

## 2018-01-28 MED ORDER — METHYLPREDNISOLONE 4 MG PO TBPK: 8.0000 mg | ORAL_TABLET | Freq: Every evening | ORAL | Status: DC

## 2018-01-28 MED ORDER — METHYLPREDNISOLONE 4 MG PO TBPK
8.0000 mg | ORAL_TABLET | Freq: Every evening | ORAL | Status: DC
Start: 2018-01-30 — End: 2018-01-30

## 2018-01-28 MED ORDER — METOPROLOL SUCCINATE ER 25 MG PO TB24
25.0000 mg | ORAL_TABLET | Freq: Every day | ORAL | Status: DC
  Administered 2018-01-29 – 2018-01-30 (×2): 25 mg via ORAL
  Filled 2018-01-28 (×2): qty 1

## 2018-01-28 MED ORDER — ACETAMINOPHEN 325 MG PO TABS: 650.0000 mg | ORAL_TABLET | Freq: Four times a day (QID) | ORAL | Status: DC | PRN

## 2018-01-28 MED ORDER — ENOXAPARIN SODIUM 40 MG/0.4ML ~~LOC~~ SOLN
40.0000 mg | SUBCUTANEOUS | Status: DC
  Administered 2018-01-28 – 2018-01-29 (×2): 40 mg via SUBCUTANEOUS
  Filled 2018-01-28 (×2): qty 0.4

## 2018-01-28 MED ORDER — METHOCARBAMOL 500 MG PO TABS
500.0000 mg | ORAL_TABLET | Freq: Three times a day (TID) | ORAL | Status: AC
  Administered 2018-01-28 – 2018-01-30 (×6): 500 mg via ORAL
  Filled 2018-01-28 (×6): qty 1

## 2018-01-28 MED ORDER — HYDROMORPHONE HCL 1 MG/ML IJ SOLN
1.0000 mg | Freq: Once | INTRAMUSCULAR | Status: AC
  Administered 2018-01-28: 1 mg via INTRAMUSCULAR
  Filled 2018-01-28: qty 1

## 2018-01-28 MED ORDER — METHYLPREDNISOLONE 4 MG PO TBPK
4.0000 mg | ORAL_TABLET | ORAL | Status: AC
Start: 2018-01-29 — End: 2018-01-29
  Administered 2018-01-29: 4 mg via ORAL

## 2018-01-28 MED ORDER — ACETAMINOPHEN 650 MG RE SUPP: 650.0000 mg | Freq: Four times a day (QID) | RECTAL | Status: DC | PRN

## 2018-01-28 NOTE — ED Notes (Signed)
First Nurse Note: [facility] via [facility] with back pain starting Sat. Hx of previous back surgeries.  12 Lead completed by EMS because patient has had cardiac stent, no chest pain at this time.  Complaining of pain in lower back radiating down right leg.  BP-170/90, HR-67, 96% on room air.

## 2018-01-28 NOTE — ED Provider Notes (Signed)
[facility] Emergency Department Provider Note   ____________________________________________   First MD Initiated Contact with Patient 01/28/18 1108     (approximate)  I have reviewed the triage vital signs and the nursing notes.   HISTORY  Chief Complaint Knee Pain and Back Pain    HPI Jesse Hodges is a 67 y.o. male patient arrived via EMS complaining of acute left low back pain and knee pain.  Patient state he cannot stand up.  Patient has a history of chronic back pain which is worsened in the past week.  Patient has injured her back years ago secondary to a ruptured disc.  Patient denies any provocative incident for this complaint.  Patient states pain feels like muscle spasm in his back and relieved only slightly with keeping the knee flexed.  Patient also states left knee pain which increased with weightbearing.  Patient denies bladder bowel dysfunction.  Patient describes his pain is "sharp/achy".  No palliative measures prior to arrival.  Past Medical History:  Diagnosis Date  . Coronary artery disease   . Dyslipidemia 07/16/2014    Patient Active Problem List   Diagnosis Date Noted  . CAD (coronary artery disease), native coronary artery 07/17/2014  . 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 Angina effort 07/16/2014  . Abnormal cardiovascular function study 07/16/2014  . Dyslipidemia 07/16/2014    Past Surgical History:  Procedure Laterality Date  . BACK SURGERY    . CORONARY ANGIOPLASTY WITH STENT PLACEMENT  07/17/2014   "1"  . LEFT HEART CATHETERIZATION WITH CORONARY ANGIOGRAM N/A 07/17/2014   Procedure: LEFT HEART CATHETERIZATION WITH CORONARY ANGIOGRAM;  Surgeon: Othella Boyer, MD;  Location: [facility] CATH LAB;  Service: Cardiovascular;  Laterality: N/A;  . LUMBAR LAMINECTOMY  ~ 1995; ~ 2000; ~ 2002; ~ 2004  . SHOULDER ARTHROSCOPY Right 02/2007   adhesion release    Prior to Admission medications   Medication Sig Start Date End Date Taking? Authorizing Provider  hydrochlorothiazide (MICROZIDE) 12.5 MG capsule Take 12.5 mg by mouth daily.   Yes [provider]  losartan (COZAAR) 50 MG tablet Take 50 mg by mouth daily.   Yes [provider]  Ascorbic Acid (VITAMIN C PO) Take 1 tablet by mouth 2 (two) times a week.    [provider]  aspirin 81 MG tablet Take 81 mg by mouth daily.    [provider]  atorvastatin (LIPITOR) 40 MG tablet Take 1 tablet (40 mg total) by mouth daily. 07/19/14   Othella Boyer, MD  metoprolol succinate (TOPROL-XL) 25 MG 24 hr tablet Take 1 tablet by mouth daily. 07/16/14   [provider]  NITROSTAT 0.4 MG SL tablet Place 0.4 mg under the tongue every 5 (five) minutes as needed for chest pain.  07/08/14   [provider]  Omega-3 Fatty Acids (FISH OIL PO) Take 1 capsule by mouth 2 (two) times a week.    [provider]  ticagrelor (BRILINTA) 90 MG TABS tablet Take 1 tablet (90 mg total) by mouth 2 (two) times daily. 07/19/14   Othella Boyer, MD    Allergies Patient has no known allergies.  No family history on file.  Social History Social History   Tobacco Use  . Smoking status: Never Smoker  . Smokeless tobacco: Never Used  Substance Use Topics  . Alcohol use: Yes    Alcohol/week: 0.0 oz  Comment: 07/17/2014 "a glass of wine a few times/yr"  . Drug use: No    Review of Systems  Constitutional: No fever/chills Eyes: No visual changes. ENT: No sore throat. Cardiovascular: Denies chest pain. Respiratory: Denies shortness of breath. Gastrointestinal: No abdominal pain.  No nausea, no vomiting.  No diarrhea.  No constipation. Genitourinary: Negative for dysuria. Musculoskeletal: Positive for chronic back pain. Skin: Negative for rash. Neurological: Negative for headaches, focal weakness or  numbness. Endocrine:Hyperlipidemia Hematological/Lymphatic: Allergic/Immunilogical: **}  ____________________________________________   PHYSICAL EXAM:  VITAL SIGNS: ED Triage Vitals  Enc Vitals Group     BP 01/28/18 1103 (!) 156/92     Pulse Rate 01/28/18 1103 83     Resp 01/28/18 1103 20     Temp 01/28/18 1103 (!) 97.5 F (36.4 C)     Temp Source 01/28/18 1103 Oral     SpO2 01/28/18 1103 99 %     Weight 01/28/18 1104 200 lb (90.7 kg)     Height 01/28/18 1104 6\' 1"  (1.854 m)     Head Circumference --      Peak Flow --      Pain Score 01/28/18 1104 10     Pain Loc --      Pain Edu? --      Excl. in GC? --     Constitutional: Alert and oriented.  Moderate distress.   Cardiovascular: Normal rate, regular rhythm. Grossly normal heart sounds.  Good peripheral circulation.  Elevated blood pressure Respiratory: Normal respiratory effort.  No retractions. Lungs CTAB. Gastrointestinal: Soft and nontender. No distention. No abdominal bruits. No CVA tenderness. Musculoskeletal: No lower extremity tenderness nor edema.  No joint effusions. Neurologic:  Normal speech and language. No gross focal neurologic deficits are appreciated. No gait instability. Skin:  Skin is warm, dry and intact. No rash noted. Psychiatric: Mood and affect are normal. Speech and behavior are normal.  ____________________________________________   LABS (all labs ordered are listed, but only abnormal results are displayed)  Labs Reviewed  BASIC METABOLIC PANEL - Abnormal; Notable for the following components:      Result Value   Glucose, Bld 127 (*)    All other components within normal limits  CBC WITH DIFFERENTIAL/PLATELET   ____________________________________________  EKG   ____________________________________________  RADIOLOGY  ED MD interpretation:    Official radiology report(s): Dg Lumbar Spine Complete  Result Date: 01/28/2018 CLINICAL DATA:  Acute left-sided low back pain after  mowing lawn. EXAM: LUMBAR SPINE - COMPLETE 4+ VIEW COMPARISON:  None. FINDINGS: Mild levoscoliosis of lumbar spine is noted. No fracture or spondylolisthesis is noted. Moderate degenerative disc disease is noted at L2-3, L3-4 and L4-5 with anterior osteophyte formation. IMPRESSION: Moderate multilevel degenerative disc disease. No acute abnormality seen in the lumbar spine. Electronically Signed   By: Lupita Raider, M.D.   On: 01/28/2018 12:40   Ct Lumbar Spine Wo Contrast  Result Date: 01/28/2018 CLINICAL DATA:  Low back pain with bilateral leg pain and weakness beginning after mowing the yard 2 days ago. EXAM: CT LUMBAR SPINE WITHOUT CONTRAST TECHNIQUE: Multidetector CT imaging of the lumbar spine was performed without intravenous contrast administration. Multiplanar CT image reconstructions were also generated. COMPARISON:  Lumbar spine radiographs 01/28/2018 FINDINGS: Segmentation: Standard. Alignment: Mild lumbar levoscoliosis. Straightening of the normal lumbar lordosis. Trace retrolisthesis of L5 on S1. Vertebrae: No acute fracture or destructive osseous process. Severe asymmetric right-sided disc space narrowing at L3-4 and L4-5 with vacuum disc. Mild disc space narrowing at L2-3  and L5-S1. Paraspinal and other soft tissues: Minimal aortic atherosclerosis without aneurysm. Disc levels: L1-2: Mild disc bulging without stenosis. L2-3: Circumferential disc bulging, annular calcification, and mild facet and ligamentum flavum hypertrophy result in mild spinal stenosis and minimal left neural foraminal stenosis. L3-4: Prior left laminotomy. Circumferential disc bulging, endplate spurring, and mild facet hypertrophy result in right greater than left lateral recess stenosis and mild bilateral neural foraminal stenosis. L4-5: Prior left laminectomy. Disc bulging, endplate spurring, and mild facet hypertrophy result in mild right neural foraminal stenosis and likely mild right lateral recess stenosis without  significant spinal stenosis. L5-S1: Disc bulging results in mild bilateral neural foraminal stenosis and likely mild left greater than right lateral recess stenosis without significant spinal stenosis. IMPRESSION: 1. No acute osseous abnormality. 2. Postoperative changes and advanced disc degeneration at L3-4 and L4-5. 3. Mild multilevel neural foraminal and lateral recess stenosis. 4. Mild spinal stenosis at L2-3. Electronically Signed   By: Sebastian Ache M.D.   On: 01/28/2018 13:16   Mr Lumbar Spine Wo Contrast  Result Date: 01/28/2018 CLINICAL DATA:  Low back pain radiating to the left knee. Prior lumbar surgery. EXAM: MRI LUMBAR SPINE WITHOUT CONTRAST TECHNIQUE: Multiplanar, multisequence MR imaging of the lumbar spine was performed. No intravenous contrast was administered. COMPARISON:  Lumbar spine CT 01/28/2018 FINDINGS: Segmentation:  Standard. Alignment: Mild lumbar levoscoliosis. Straightening of the normal lumbar lordosis. Trace retrolisthesis of L5 on S1. Vertebrae: No fracture or suspicious osseous lesion. Mixed type 1 and type 2 degenerative endplate changes at L3-4. Conus medullaris and cauda equina: Conus extends to the L1 level. Conus and cauda equina appear normal. Paraspinal and other soft tissues: Prominent lymphatics anterior to the lumbar spine in the retroperitoneum. Disc levels: Disc desiccation throughout the lumbar spine. Severe disc space narrowing asymmetrically greater on the right at L3-4 and L4-5 with milder narrowing at L2-3 and L5-S1. T12-L1: Negative. L1-2: Small left paracentral disc protrusion without stenosis. L2-3: Mild circumferential disc bulging, small left foraminal disc protrusion, and mild facet arthrosis result in mild bilateral lateral recess stenosis, borderline to mild spinal stenosis, and minimal left neural foraminal narrowing. L3-4: Prior left laminectomy. Circumferential disc bulging, a possible tiny left subarticular disc extrusion, and mild facet hypertrophy  result in moderate bilateral lateral recess stenosis, mild spinal stenosis, and mild bilateral neural foraminal stenosis. Potential bilateral L4 nerve root impingement. L4-5: Prior left laminectomy. Circumferential disc bulging, small superimposed right paracentral disc protrusion, and mild facet hypertrophy result in mild to moderate right and mild left lateral recess stenosis and mild bilateral neural foraminal stenosis without spinal stenosis. L5-S1: Disc bulging results in mild left greater than right lateral recess stenosis and mild bilateral neural foraminal stenosis without spinal stenosis. IMPRESSION: 1. Postoperative changes and advanced disc degeneration at L3-4 and L4-5 resulting in moderate lateral recess stenosis more notable at L3-4. Potential L4 nerve root impingement. 2. Mild multilevel neural foraminal stenosis. 3. Mild spinal stenosis at L2-3. Electronically Signed   By: Sebastian Ache M.D.   On: 01/28/2018 16:26    ____________________________________________   PROCEDURES  Procedure(s) performed:   Procedures  Critical Care performed: No  ____________________________________________   INITIAL IMPRESSION / ASSESSMENT AND PLAN / ED COURSE  As part of my medical decision making, I reviewed the following data within the electronic MEDICAL RECORD NUMBER    Patient presents with acute back pain with radicular component to the left lower extremity.  Patient unable to walk or stand up unassisted.  Discussed x-ray, CT,  and MRI results with patient.  The study shows mostly degenerative changes but no signs of cauda equina or herniated disc.  Discussed patient with Dr. Mayford KnifeWilliams.        ____________________________________________   FINAL CLINICAL IMPRESSION(S) / ED DIAGNOSES  Final diagnoses:  Chronic bilateral low back pain with left-sided sciatica     ED Discharge Orders    None       Note:  This document was prepared using Dragon voice recognition software and may  include unintentional dictation errors.    Joni ReiningSmith, Ronald K, PA-C 01/28/18 1651    Sharyn CreamerQuale, Mark, MD 01/28/18 [phone]

## 2018-01-28 NOTE — ED Notes (Signed)
Taken to MRI via stretcher  Family at bedside

## 2018-01-28 NOTE — ED Notes (Signed)
Pt is back from ct scan  States pain meds only last for a few mins  Provider aware

## 2018-01-28 NOTE — ED Triage Notes (Signed)
Pt arrived via ems from home with complaints of uncontrolled left knee and left lower back pain. Pt states the pain starts in his lower back and radiates down to left knee. Pt has a hx of injury to lower back but states the pain has increased over the last week but today was debilitating. No new injury.

## 2018-01-28 NOTE — H&P (Signed)
[facility] [place] at [facility]   PATIENT NAME: Jesse Hodges    MR#:  [id number]  DATE OF BIRTH:  04/14/1951  DATE OF ADMISSION:  01/28/2018  PRIMARY CARE PHYSICIAN: Hodges, Jesse Dance, MD   REQUESTING/REFERRING PHYSICIAN: Dr. Sharyn Creamer  CHIEF COMPLAINT:   Chief Complaint  Patient presents with  . Knee Pain  . Back Pain    HISTORY OF PRESENT ILLNESS:  Jesse Hodges  is a 67 y.o. male with a known history of CAD status post PCI, low back pain status post lumbar surgeries, degenerative disc disease presents to hospital secondary to worsening of low back pain and inability to walk for the last 2 days. Since his last back surgery about 10 to 15 years ago, patient has been doing well, takes as needed over-the-counter pain medicine.  But for the last 2 weeks his back has been hurting more since he was mowing lawn.  He has been using Advil as needed.  But he was still able to function as the pain improved by later in the day.  In the last 2 days he has gotten to the point that the pain medication was not helping and he was even unable to ambulate in the house.  Denies any pain shooting down his legs.  Denies any bowel or bladder incontinence.  He received several doses of IV pain medication here in the hospital and is being admitted for the same.  MRI of the lumbar spine has extensive degenerative disc disease but did not reveal any cord compression.  PAST MEDICAL HISTORY:   Past Medical History:  Diagnosis Date  . Coronary artery disease   . DDD (degenerative disc disease), lumbar   . Dyslipidemia 07/16/2014  . Low back pain     PAST SURGICAL HISTORY:   Past Surgical History:  Procedure Laterality Date  . BACK SURGERY    . CORONARY ANGIOPLASTY WITH STENT PLACEMENT  07/17/2014   "1"  . LEFT HEART CATHETERIZATION WITH CORONARY ANGIOGRAM N/A 07/17/2014   Procedure: LEFT HEART CATHETERIZATION WITH CORONARY ANGIOGRAM;  Surgeon: Othella Boyer, MD;  Location: [facility]  CATH LAB;  Service: Cardiovascular;  Laterality: N/A;  . LUMBAR LAMINECTOMY  ~ 1995; ~ 2000; ~ 2002; ~ 2004  . SHOULDER ARTHROSCOPY Right 02/2007   adhesion release    SOCIAL HISTORY:   Social History   Tobacco Use  . Smoking status: Never Smoker  . Smokeless tobacco: Never Used  Substance Use Topics  . Alcohol use: Yes    Alcohol/week: 0.0 oz    Comment: 07/17/2014 "a glass of wine a few times/yr"    FAMILY HISTORY:   Family History  Problem Relation Age of Onset  . Hypertension Father     DRUG ALLERGIES:  No Known Allergies  REVIEW OF SYSTEMS:   Review of Systems  Constitutional: Negative for chills, fever, malaise/fatigue and weight loss.  HENT: Negative for ear discharge, ear pain, hearing loss, nosebleeds and tinnitus.   Eyes: Negative for blurred vision, double vision and photophobia.  Respiratory: Negative for cough, hemoptysis, shortness of breath and wheezing.   Cardiovascular: Negative for chest pain, palpitations, orthopnea and leg swelling.  Gastrointestinal: Negative for abdominal pain, constipation, diarrhea, heartburn, melena, nausea and vomiting.  Genitourinary: Negative for dysuria, frequency, hematuria and urgency.  Musculoskeletal: Positive for back pain and myalgias. Negative for neck pain.  Skin: Negative for rash.  Neurological: Negative for dizziness, tingling, tremors, sensory change, speech change, focal weakness and headaches.  Endo/Heme/Allergies:  Does not bruise/bleed easily.  Psychiatric/Behavioral: Negative for depression.    MEDICATIONS AT HOME:   Prior to Admission medications   Medication Sig Start Date End Date Taking? Authorizing Provider  Ascorbic Acid (VITAMIN C PO) Take 1 tablet by mouth 2 (two) times a week.   Yes [provider]  aspirin 81 MG tablet Take 81 mg by mouth daily.   Yes [provider]  atorvastatin (LIPITOR) 40 MG tablet Take 1 tablet (40 mg total) by mouth daily. 07/19/14  Yes Othella Boyer,  MD  hydrochlorothiazide (MICROZIDE) 12.5 MG capsule Take 12.5 mg by mouth daily.   Yes [provider]  losartan (COZAAR) 50 MG tablet Take 50 mg by mouth daily.   Yes [provider]  Omega-3 Fatty Acids (FISH OIL PO) Take 1 capsule by mouth 2 (two) times a week.   Yes [provider]  metoprolol succinate (TOPROL-XL) 25 MG 24 hr tablet Take 1 tablet by mouth daily. 07/16/14   [provider]  NITROSTAT 0.4 MG SL tablet Place 0.4 mg under the tongue every 5 (five) minutes as needed for chest pain.  07/08/14   [provider]  ticagrelor (BRILINTA) 90 MG TABS tablet Take 1 tablet (90 mg total) by mouth 2 (two) times daily. 07/19/14   Othella Boyer, MD      VITAL SIGNS:  Blood pressure (!) 138/93, pulse 72, temperature 98.3 F (36.8 C), temperature source Oral, resp. rate 16, height 6\' 1"  (1.854 m), weight 90.7 kg (200 lb), SpO2 97 %.  PHYSICAL EXAMINATION:   Physical Exam  GENERAL:  67 y.o.-year-old patient lying patient lying in the bed with no acute distress.  EYES: Pupils equal, round, reactive to light and accommodation. No scleral icterus. Extraocular muscles intact.  HEENT: Head atraumatic, normocephalic. Oropharynx and nasopharynx clear.  NECK:  Supple, no jugular venous distention. No thyroid enlargement, no tenderness.  LUNGS: Normal breath sounds bilaterally, no wheezing, rales,rhonchi or crepitation. No use of accessory muscles of respiration.  CARDIOVASCULAR: S1, S2 normal. No murmurs, rubs, or gallops.  ABDOMEN: Soft, nontender, nondistended. Bowel sounds present. No organomegaly or mass.  BACK-able to flex both his knees, straight leg raising test is positive on the right side.  Tenderness in the lower back, midline EXTREMITIES: No pedal edema, cyanosis, or clubbing.  NEUROLOGIC: Cranial nerves II through XII are intact. Muscle strength 5/5 in all extremities. Sensation intact. Gait not checked.  PSYCHIATRIC: The patient is alert and oriented x  3.  SKIN: No obvious rash, lesion, or ulcer.   LABORATORY PANEL:   CBC Recent Labs  Lab 01/28/18 1305  WBC 7.6  HGB 15.2  HCT 42.8  PLT 184   ------------------------------------------------------------------------------------------------------------------  Chemistries  Recent Labs  Lab 01/28/18 1305  NA 140  K 4.0  CL 106  CO2 27  GLUCOSE 127*  BUN 20  CREATININE 0.82  CALCIUM 9.5   ------------------------------------------------------------------------------------------------------------------  Cardiac Enzymes No results for input(s): TROPONINI in the last 168 hours. ------------------------------------------------------------------------------------------------------------------  RADIOLOGY:  Dg Lumbar Spine Complete  Result Date: 01/28/2018 CLINICAL DATA:  Acute left-sided low back pain after mowing lawn. EXAM: LUMBAR SPINE - COMPLETE 4+ VIEW COMPARISON:  None. FINDINGS: Mild levoscoliosis of lumbar spine is noted. No fracture or spondylolisthesis is noted. Moderate degenerative disc disease is noted at L2-3, L3-4 and L4-5 with anterior osteophyte formation. IMPRESSION: Moderate multilevel degenerative disc disease. No acute abnormality seen in the lumbar spine. Electronically Signed   By: Lupita Raider, M.D.  On: 01/28/2018 12:40   Ct Lumbar Spine Wo Contrast  Result Date: 01/28/2018 CLINICAL DATA:  Low back pain with bilateral leg pain and weakness beginning after mowing the yard 2 days ago. EXAM: CT LUMBAR SPINE WITHOUT CONTRAST TECHNIQUE: Multidetector CT imaging of the lumbar spine was performed without intravenous contrast administration. Multiplanar CT image reconstructions were also generated. COMPARISON:  Lumbar spine radiographs 01/28/2018 FINDINGS: Segmentation: Standard. Alignment: Mild lumbar levoscoliosis. Straightening of the normal lumbar lordosis. Trace retrolisthesis of L5 on S1. Vertebrae: No acute fracture or destructive osseous process. Severe  asymmetric right-sided disc space narrowing at L3-4 and L4-5 with vacuum disc. Mild disc space narrowing at L2-3 and L5-S1. Paraspinal and other soft tissues: Minimal aortic atherosclerosis without aneurysm. Disc levels: L1-2: Mild disc bulging without stenosis. L2-3: Circumferential disc bulging, annular calcification, and mild facet and ligamentum flavum hypertrophy result in mild spinal stenosis and minimal left neural foraminal stenosis. L3-4: Prior left laminotomy. Circumferential disc bulging, endplate spurring, and mild facet hypertrophy result in right greater than left lateral recess stenosis and mild bilateral neural foraminal stenosis. L4-5: Prior left laminectomy. Disc bulging, endplate spurring, and mild facet hypertrophy result in mild right neural foraminal stenosis and likely mild right lateral recess stenosis without significant spinal stenosis. L5-S1: Disc bulging results in mild bilateral neural foraminal stenosis and likely mild left greater than right lateral recess stenosis without significant spinal stenosis. IMPRESSION: 1. No acute osseous abnormality. 2. Postoperative changes and advanced disc degeneration at L3-4 and L4-5. 3. Mild multilevel neural foraminal and lateral recess stenosis. 4. Mild spinal stenosis at L2-3. Electronically Signed   By: Sebastian AcheAllen  Grady M.D.   On: 01/28/2018 13:16   Mr Lumbar Spine Wo Contrast  Result Date: 01/28/2018 CLINICAL DATA:  Low back pain radiating to the left knee. Prior lumbar surgery. EXAM: MRI LUMBAR SPINE WITHOUT CONTRAST TECHNIQUE: Multiplanar, multisequence MR imaging of the lumbar spine was performed. No intravenous contrast was administered. COMPARISON:  Lumbar spine CT 01/28/2018 FINDINGS: Segmentation:  Standard. Alignment: Mild lumbar levoscoliosis. Straightening of the normal lumbar lordosis. Trace retrolisthesis of L5 on S1. Vertebrae: No fracture or suspicious osseous lesion. Mixed type 1 and type 2 degenerative endplate changes at L3-4.  Conus medullaris and cauda equina: Conus extends to the L1 level. Conus and cauda equina appear normal. Paraspinal and other soft tissues: Prominent lymphatics anterior to the lumbar spine in the retroperitoneum. Disc levels: Disc desiccation throughout the lumbar spine. Severe disc space narrowing asymmetrically greater on the right at L3-4 and L4-5 with milder narrowing at L2-3 and L5-S1. T12-L1: Negative. L1-2: Small left paracentral disc protrusion without stenosis. L2-3: Mild circumferential disc bulging, small left foraminal disc protrusion, and mild facet arthrosis result in mild bilateral lateral recess stenosis, borderline to mild spinal stenosis, and minimal left neural foraminal narrowing. L3-4: Prior left laminectomy. Circumferential disc bulging, a possible tiny left subarticular disc extrusion, and mild facet hypertrophy result in moderate bilateral lateral recess stenosis, mild spinal stenosis, and mild bilateral neural foraminal stenosis. Potential bilateral L4 nerve root impingement. L4-5: Prior left laminectomy. Circumferential disc bulging, small superimposed right paracentral disc protrusion, and mild facet hypertrophy result in mild to moderate right and mild left lateral recess stenosis and mild bilateral neural foraminal stenosis without spinal stenosis. L5-S1: Disc bulging results in mild left greater than right lateral recess stenosis and mild bilateral neural foraminal stenosis without spinal stenosis. IMPRESSION: 1. Postoperative changes and advanced disc degeneration at L3-4 and L4-5 resulting in moderate lateral recess stenosis more notable  at L3-4. Potential L4 nerve root impingement. 2. Mild multilevel neural foraminal stenosis. 3. Mild spinal stenosis at L2-3. Electronically Signed   By: Sebastian Ache M.D.   On: 01/28/2018 16:26    EKG:   Orders placed or performed during the hospital encounter of 07/17/14  . EKG 12-Lead  . EKG 12-Lead  . EKG 12-Lead  . EKG 12-Lead  . EKG  12-Lead  . EKG 12-Lead  . EKG 12-Lead  . EKG 12-Lead  . EKG 12-Lead  . EKG 12-Lead    IMPRESSION AND PLAN:   Jesse Hodges  is a 67 y.o. male with a known history of CAD status post PCI, low back pain status post lumbar surgeries, degenerative disc disease presents to hospital secondary to worsening of low back pain and inability to walk for the last 2 days.  1.  Low back pain-MRI of the lower back showing severe disc degeneration at L3-4, L4-5 levels with potential L4 nerve root impingement. -Admit for pain control.  Started on Medrol Dosepak -Neurosurgery consult and physical therapy consult. -If surgery required, Brilinta can be held as patient's PCI was more than 2 years ago  2.  CAD status post PCI-table, no chest pain. -Continue cardiac medications.  On aspirin, Brilinta, losartan, metoprolol.  3.  Hypertension-continue hydrochlorothiazide, losartan and metoprolol  4.  DVT prophylaxis-Lovenox    All the records are reviewed and case discussed with ED provider. Management plans discussed with the patient, family and they are in agreement.  CODE STATUS: Full code  TOTAL TIME TAKING CARE OF THIS PATIENT: 50 minutes.    Enid Baas M.D on 01/28/2018 at 8:46 PM  Between 7am to 6pm - Pager - [phone]  After 6pm go to [email or web address] - password [employer]  [facility]  Office  [phone]  CC: Primary care physician; Clayborn Heron, MD

## 2018-01-28 NOTE — Progress Notes (Signed)
Pharmacy consulted to order Medrol dosepak to start tomorrow. Dose pack ordered starting tomorrow.   Laterra Lubinski A. [place], [place]., BCPS Clinical Pharmacist 01/28/2018 20:20

## 2018-01-28 NOTE — ED Notes (Signed)
See triage note  presents with back pain and  leg pain  States he has had back surgeries in past  But states he developed pain after mowing the yard on Saturday   States it feels like bone is moving around

## 2018-01-29 LAB — CBC
HCT: 41.7 % (ref 40.0–52.0)
HEMOGLOBIN: 14.8 g/dL (ref 13.0–18.0)
MCH: 34.7 pg — AB (ref 26.0–34.0)
MCHC: 35.6 g/dL (ref 32.0–36.0)
MCV: 97.5 fL (ref 80.0–100.0)
Platelets: 185 10*3/uL (ref 150–440)
RBC: 4.28 MIL/uL — AB (ref 4.40–5.90)
RDW: 12.8 % (ref 11.5–14.5)
WBC: 8.7 10*3/uL (ref 3.8–10.6)

## 2018-01-29 LAB — BASIC METABOLIC PANEL
ANION GAP: 7 (ref 5–15)
BUN: 25 mg/dL — ABNORMAL HIGH (ref 8–23)
CHLORIDE: 106 mmol/L (ref 98–111)
CO2: 25 mmol/L (ref 22–32)
Calcium: 9.1 mg/dL (ref 8.9–10.3)
Creatinine, Ser: 0.95 mg/dL (ref 0.61–1.24)
GFR calc non Af Amer: >60 mL/min (ref >60)
Glucose, Bld: 194 mg/dL — ABNORMAL HIGH (ref 70–99)
POTASSIUM: 4.2 mmol/L (ref 3.5–5.1)
Sodium: 138 mmol/L (ref 135–145)

## 2018-01-29 NOTE — Evaluation (Signed)
Physical Therapy Evaluation Patient Details Name: Jesse Hodges MRN: [id number] DOB: [birth year] Today's Date: 01/29/2018   History of Present Illness  67 y/o male here with intractible back pain with L LE radiating symptoms.  Pt has a history of multiple back surgeries 10-15 years ago that gave considerable relief.  He started having increased pain ~6 months ago that he was able to manage but after prolonged bout of lawn mowing has had severe pain the last few days with no relief.  Clinical Impression  Pt in severe acute pain t/o the session, was able to communicate and with knees flexed and no movement was able to achieve some minimal "comfort."  Attempt at getting to sitting/standing thwarted by severe pain and increased L LE numbness/pain just getting to sitting.  PT assisted with rolling to side (maintaining neutral log roll) but he could not tolerate PROM attempts to get LEs off EOB to pivot to upright.  PT spent additional time discussing mobility, postioning and general education apart from the PT exam. Pt in severe pain and currently is not mobile enough to safely go home.  Waiting on neurosurgery to determine the viability of getting an injection and further plans.  Per current status pt would require STR before being able to go home.      Follow Up Recommendations SNF(pt hoping to have acute pain relief and go home (HHPT))    Equipment Recommendations  Rolling walker with 5" wheels    Recommendations for Other Services       Precautions / Restrictions Restrictions Weight Bearing Restrictions: No Other Position/Activity Restrictions: no corset/brace orders from neurosurgery, etc      Mobility  Bed Mobility Overal bed mobility: Needs Assistance Bed Mobility: Rolling Rolling: Max assist         General bed mobility comments: Pt willing to try to get to sitting but c/o severe pain with any movement, needed heavy assist just to get to sidelying and ultimately stated there was  "no way" he could get to sitting at this time secondary to worsening pain.  Pt with very poor tolerance with any motion and attempts to straighten LEs or get them off EOB were unsuccessful.   Transfers                 General transfer comment: unable to attempt secondary to pain, could not even get to sitting  Ambulation/Gait                Stairs            Wheelchair Mobility    Modified Rankin (Stroke Patients Only)       Balance Overall balance assessment: (unable to achieve upright sitting at EOB)                                           Pertinent Vitals/Pain Pain Assessment: No/denies pain    Home Living Family/patient expects to be discharged to:: Private residence Living Arrangements: Spouse/significant other Available Help at Discharge: Family;Available 24 hours/day   Home Access: Stairs to enter   Entrance Stairs-Number of Steps: 3   Home Equipment: None      Prior Function Level of Independence: Independent         Comments: Pt needs to take regular breaks, etc secondary to chronic back issues but can be somewhat active     Hand Dominance  Extremity/Trunk Assessment   Upper Extremity Assessment Upper Extremity Assessment: Overall WFL for tasks assessed(pain limited but functional)    Lower Extremity Assessment Lower Extremity Assessment: Overall WFL for tasks assessed(L LE functional strength in limited "comfortable" range)       Communication   Communication: No difficulties  Cognition Arousal/Alertness: Awake/alert Behavior During Therapy: Restless Overall Cognitive Status: Within Functional Limits for tasks assessed                                 General Comments: Pt fully focused on severity of acute pain      General Comments      Exercises     Assessment/Plan    PT Assessment Patient needs continued PT services  PT Problem List Decreased strength;Decreased range  of motion;Decreased activity tolerance;Decreased mobility;Decreased balance;Decreased safety awareness;Pain       PT Treatment Interventions DME instruction;Gait training;Stair training;Functional mobility training;Therapeutic activities;Therapeutic exercise;Balance training;Neuromuscular re-education;Patient/family education    PT Goals (Current goals can be found in the Care Plan section)  Acute Rehab PT Goals Patient Stated Goal: control the pain  PT Goal Formulation: With patient Time For Goal Achievement: 02/12/18 Potential to Achieve Goals: Fair    Frequency Min 2X/week   Barriers to discharge        Co-evaluation               AM-PAC PT "6 Clicks" Daily Activity  Outcome Measure Difficulty turning over in bed (including adjusting bedclothes, sheets and blankets)?: Unable Difficulty moving from lying on back to sitting on the side of the bed? : Unable Difficulty sitting down on and standing up from a chair with arms (e.g., wheelchair, bedside commode, etc,.)?: Unable Help needed moving to and from a bed to chair (including a wheelchair)?: Total Help needed walking in hospital room?: Total Help needed climbing 3-5 steps with a railing? : Total 6 Click Score: 6    End of Session   Activity Tolerance: Patient limited by pain Patient left: with call bell/phone within reach;with bed alarm set;with family/visitor present Nurse Communication: Mobility status;Patient requests pain meds PT Visit Diagnosis: Muscle weakness (generalized) (M62.81);Difficulty in walking, not elsewhere classified (R26.2);Pain Pain - Right/Left: Left Pain - part of body: Hip;Leg;Knee(lumbago)    Time: 1030-1105 PT Time Calculation (min) (ACUTE ONLY): 35 min   Charges:   PT Evaluation $PT Eval Moderate Complexity: 1 Mod PT Treatments $Therapeutic Activity: 8-22 mins        Malachi ProGalen R Zeeshan Korte, DPT 01/29/2018, 12:18 PM

## 2018-01-29 NOTE — Progress Notes (Signed)
[facility] - [place] [facility]   PATIENT NAME: Jesse Hodges    MR#:  [id number]  DATE OF BIRTH:  07/16/1950  SUBJECTIVE:  CHIEF COMPLAINT:   Chief Complaint  Patient presents with  . Knee Pain  . Back Pain  Patient seen and evaluated today Has low back pain Pain sometimes radiates to the left lower extremity  no bowel bladder incontinence No weakness in lower extremities   REVIEW OF SYSTEMS:    ROS  CONSTITUTIONAL: No documented fever. No fatigue, weakness. No weight gain, no weight loss.  EYES: No blurry or double vision.  ENT: No tinnitus. No postnasal drip. No redness of the oropharynx.  RESPIRATORY: No cough, no wheeze, no hemoptysis. No dyspnea.  CARDIOVASCULAR: No chest pain. No orthopnea. No palpitations. No syncope.  GASTROINTESTINAL: No nausea, no vomiting or diarrhea. No abdominal pain. No melena or hematochezia.  GENITOURINARY: No dysuria or hematuria.  ENDOCRINE: No polyuria or nocturia. No heat or cold intolerance.  HEMATOLOGY: No anemia. No bruising. No bleeding.  INTEGUMENTARY: No rashes. No lesions.  MUSCULOSKELETAL: No arthritis. No swelling. No gout.  Has low back pain NEUROLOGIC: No numbness, tingling, or ataxia. No seizure-type activity.  PSYCHIATRIC: No anxiety. No insomnia. No ADD.   DRUG ALLERGIES:  No Known Allergies  VITALS:  Blood pressure 133/81, pulse 67, temperature 97.6 F (36.4 C), temperature source Oral, resp. rate 18, height 6\' 1"  (1.854 m), weight 90.7 kg (200 lb), SpO2 100 %.  PHYSICAL EXAMINATION:   Physical Exam  GENERAL:  67 y.o.-year-old patient lying in the bed with no acute distress.  EYES: Pupils equal, round, reactive to light and accommodation. No scleral icterus. Extraocular muscles intact.  HEENT: Head atraumatic, normocephalic. Oropharynx and nasopharynx clear.  NECK:  Supple, no jugular venous distention. No thyroid enlargement, no tenderness.  LUNGS: Normal breath sounds bilaterally, no  wheezing, rales, rhonchi. No use of accessory muscles of respiration.  CARDIOVASCULAR: S1, S2 normal. No murmurs, rubs, or gallops.  ABDOMEN: Soft, nontender, nondistended. Bowel sounds present. No organomegaly or mass.  EXTREMITIES: No cyanosis, clubbing or edema b/l.    Low back pain with radiation of pain to left lower extremity NEUROLOGIC: Cranial nerves II through XII are intact. No focal Motor or sensory deficits b/l.   PSYCHIATRIC: The patient is alert and oriented x 3.  SKIN: No obvious rash, lesion, or ulcer.   LABORATORY PANEL:   CBC Recent Labs  Lab 01/29/18 0334  WBC 8.7  HGB 14.8  HCT 41.7  PLT 185   ------------------------------------------------------------------------------------------------------------------ Chemistries  Recent Labs  Lab 01/29/18 0334  NA 138  K 4.2  CL 106  CO2 25  GLUCOSE 194*  BUN 25*  CREATININE 0.95  CALCIUM 9.1   ------------------------------------------------------------------------------------------------------------------  Cardiac Enzymes No results for input(s): TROPONINI in the last 168 hours. ------------------------------------------------------------------------------------------------------------------  RADIOLOGY:  Dg Lumbar Spine Complete  Result Date: 01/28/2018 CLINICAL DATA:  Acute left-sided low back pain after mowing lawn. EXAM: LUMBAR SPINE - COMPLETE 4+ VIEW COMPARISON:  None. FINDINGS: Mild levoscoliosis of lumbar spine is noted. No fracture or spondylolisthesis is noted. Moderate degenerative disc disease is noted at L2-3, L3-4 and L4-5 with anterior osteophyte formation. IMPRESSION: Moderate multilevel degenerative disc disease. No acute abnormality seen in the lumbar spine. Electronically Signed   By: Lupita RaiderJames  Burgin Jr, M.D.   On: 01/28/2018 12:40   Ct Lumbar Spine Wo Contrast  Result Date: 01/28/2018 CLINICAL DATA:  Low back pain with bilateral leg pain and weakness beginning  after mowing the yard 2 days ago.  EXAM: CT LUMBAR SPINE WITHOUT CONTRAST TECHNIQUE: Multidetector CT imaging of the lumbar spine was performed without intravenous contrast administration. Multiplanar CT image reconstructions were also generated. COMPARISON:  Lumbar spine radiographs 01/28/2018 FINDINGS: Segmentation: Standard. Alignment: Mild lumbar levoscoliosis. Straightening of the normal lumbar lordosis. Trace retrolisthesis of L5 on S1. Vertebrae: No acute fracture or destructive osseous process. Severe asymmetric right-sided disc space narrowing at L3-4 and L4-5 with vacuum disc. Mild disc space narrowing at L2-3 and L5-S1. Paraspinal and other soft tissues: Minimal aortic atherosclerosis without aneurysm. Disc levels: L1-2: Mild disc bulging without stenosis. L2-3: Circumferential disc bulging, annular calcification, and mild facet and ligamentum flavum hypertrophy result in mild spinal stenosis and minimal left neural foraminal stenosis. L3-4: Prior left laminotomy. Circumferential disc bulging, endplate spurring, and mild facet hypertrophy result in right greater than left lateral recess stenosis and mild bilateral neural foraminal stenosis. L4-5: Prior left laminectomy. Disc bulging, endplate spurring, and mild facet hypertrophy result in mild right neural foraminal stenosis and likely mild right lateral recess stenosis without significant spinal stenosis. L5-S1: Disc bulging results in mild bilateral neural foraminal stenosis and likely mild left greater than right lateral recess stenosis without significant spinal stenosis. IMPRESSION: 1. No acute osseous abnormality. 2. Postoperative changes and advanced disc degeneration at L3-4 and L4-5. 3. Mild multilevel neural foraminal and lateral recess stenosis. 4. Mild spinal stenosis at L2-3. Electronically Signed   By: Sebastian Ache M.D.   On: 01/28/2018 13:16   Mr Lumbar Spine Wo Contrast  Result Date: 01/28/2018 CLINICAL DATA:  Low back pain radiating to the left knee. Prior lumbar  surgery. EXAM: MRI LUMBAR SPINE WITHOUT CONTRAST TECHNIQUE: Multiplanar, multisequence MR imaging of the lumbar spine was performed. No intravenous contrast was administered. COMPARISON:  Lumbar spine CT 01/28/2018 FINDINGS: Segmentation:  Standard. Alignment: Mild lumbar levoscoliosis. Straightening of the normal lumbar lordosis. Trace retrolisthesis of L5 on S1. Vertebrae: No fracture or suspicious osseous lesion. Mixed type 1 and type 2 degenerative endplate changes at L3-4. Conus medullaris and cauda equina: Conus extends to the L1 level. Conus and cauda equina appear normal. Paraspinal and other soft tissues: Prominent lymphatics anterior to the lumbar spine in the retroperitoneum. Disc levels: Disc desiccation throughout the lumbar spine. Severe disc space narrowing asymmetrically greater on the right at L3-4 and L4-5 with milder narrowing at L2-3 and L5-S1. T12-L1: Negative. L1-2: Small left paracentral disc protrusion without stenosis. L2-3: Mild circumferential disc bulging, small left foraminal disc protrusion, and mild facet arthrosis result in mild bilateral lateral recess stenosis, borderline to mild spinal stenosis, and minimal left neural foraminal narrowing. L3-4: Prior left laminectomy. Circumferential disc bulging, a possible tiny left subarticular disc extrusion, and mild facet hypertrophy result in moderate bilateral lateral recess stenosis, mild spinal stenosis, and mild bilateral neural foraminal stenosis. Potential bilateral L4 nerve root impingement. L4-5: Prior left laminectomy. Circumferential disc bulging, small superimposed right paracentral disc protrusion, and mild facet hypertrophy result in mild to moderate right and mild left lateral recess stenosis and mild bilateral neural foraminal stenosis without spinal stenosis. L5-S1: Disc bulging results in mild left greater than right lateral recess stenosis and mild bilateral neural foraminal stenosis without spinal stenosis. IMPRESSION: 1.  Postoperative changes and advanced disc degeneration at L3-4 and L4-5 resulting in moderate lateral recess stenosis more notable at L3-4. Potential L4 nerve root impingement. 2. Mild multilevel neural foraminal stenosis. 3. Mild spinal stenosis at L2-3. Electronically Signed   By: Freida Busman  Mosetta Putt M.D.   On: 01/28/2018 16:26     ASSESSMENT AND PLAN:   67 year old male patient with history of CAD, PCI in the past, multiple lumbar surgeries, degenerative disc disease presented to the emergency room for low back pain and difficulty ambulating  -Low back pain with radiculopathy Secondary to severe disc degeneration L3-L4 and L4-L5 levels Pain management and Medrol Dosepak Status post neurosurgery evaluation  follow-up with neurosurgery and radiology for any ESI injections  -Spinal stenosis status post neurosurgery evaluation No acute intervention recommended  -Coronary artery disease with history of PCI Continue aspirin, Brilinta losartan and metoprolol  - DVT prophylaxis with [place] lovenox   All the records are reviewed and case discussed with Care Management/Social Worker. Management plans discussed with the patient, family and they are in agreement.  CODE STATUS: Full code no  DVT Prophylaxis: SCDs  TOTAL TIME TAKING CARE OF THIS PATIENT: 34 minutes.   POSSIBLE D/C IN 2 to 3 DAYS, DEPENDING ON CLINICAL CONDITION.  Ihor Austin M.D on 01/29/2018 at 11:41 AM  Between 7am to 6pm - Pager - [phone]  After 6pm go to [email or web address] - password [email or web address] [facility]  [facility] [place] [facility]  Office  [phone]  CC: Primary care physician; Clayborn Heron, MD  Note: This dictation was prepared with Dragon dictation along with smaller phrase technology. Any transcriptional errors that result from this process are unintentional.

## 2018-01-29 NOTE — [place] FL2 (Signed)
[place] MEDICAID FL2 LEVEL OF CARE SCREENING TOOL     IDENTIFICATION  Patient Name: Jesse Hodges Birthdate: 10/01/1950 Sex: male Admission Date (Current Location): 01/28/2018  [place]   Facility and Address:  [facility], [address], [place], [place] [address]      Provider Number: [id number]  Attending Physician Name and Address:  Ihor AustinPyreddy, Pavan, MD  Relative Name and Phone Number:       Current Level of Care: Hospital Recommended Level of Care: Skilled Nursing Facility Prior Approval Number:    Date Approved/Denied:   PASRR Number: ([id number])  Discharge Plan: SNF    Current Diagnoses: Patient Active Problem List   Diagnosis Date Noted  . Low back pain 01/28/2018  . CAD (coronary artery disease), native coronary artery 07/17/2014  . 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 Angina effort 07/16/2014  . Abnormal cardiovascular function study 07/16/2014  . Dyslipidemia 07/16/2014    Orientation RESPIRATION BLADDER Height & Weight     Self, Time, Situation, Place  Normal Continent Weight: 90.7 kg (200 lb) Height:  6\' 1"  (185.4 cm)  BEHAVIORAL SYMPTOMS/MOOD NEUROLOGICAL BOWEL NUTRITION STATUS      Continent Diet(Diet: Heart Healthy )  AMBULATORY STATUS COMMUNICATION OF NEEDS Skin   Extensive Assist Verbally Normal                       Personal Care Assistance Level of Assistance  Bathing, Feeding, Dressing Bathing Assistance: Limited assistance Feeding assistance: Independent Dressing Assistance: Limited assistance     Functional Limitations Info  Sight, Hearing, Speech Sight Info: Adequate Hearing Info: Adequate Speech Info: Adequate    SPECIAL CARE FACTORS FREQUENCY  PT (By licensed PT), OT (By licensed OT)     PT Frequency: (5) OT Frequency: (5)            Contractures       Additional Factors Info  Code Status, Allergies Code Status Info: (Full Code. ) Allergies Info: (No Known Allergies. )           Current Medications (01/29/2018):  This is the current hospital active medication list Current Facility-Administered Medications  Medication Dose Route Frequency Provider Last Rate Last Dose  . 0.9 %  sodium chloride infusion   Intravenous Continuous Enid BaasKalisetti, Radhika, MD 60 mL/hr at 01/28/18 2313    . acetaminophen (TYLENOL) tablet 650 mg  650 mg Oral Q6H PRN Enid BaasKalisetti, Radhika, MD       Or  . acetaminophen (TYLENOL) suppository 650 mg  650 mg Rectal Q6H PRN Enid BaasKalisetti, Radhika, MD      . aspirin EC tablet 81 mg  81 mg Oral Daily Enid BaasKalisetti, Radhika, MD   81 mg at 01/29/18 0849  . atorvastatin (LIPITOR) tablet 40 mg  40 mg Oral Daily Enid BaasKalisetti, Radhika, MD   40 mg at 01/28/18 2315  . docusate sodium (COLACE) capsule 100 mg  100 mg Oral BID Enid BaasKalisetti, Radhika, MD   100 mg at 01/29/18 0850  . enoxaparin (LOVENOX) injection 40 mg  40 mg Subcutaneous Q24H Enid BaasKalisetti, Radhika, MD   40 mg at 01/28/18 2314  . hydrochlorothiazide (MICROZIDE) capsule 12.5 mg  12.5 mg Oral Daily Enid BaasKalisetti, Radhika, MD   12.5 mg at 01/29/18 1034  . HYDROcodone-acetaminophen (NORCO/VICODIN) 5-325 MG per tablet 1-2 tablet  1-2 tablet Oral Q4H PRN Enid BaasKalisetti, Radhika, MD   2 tablet at  01/29/18 0850  . losartan (COZAAR) tablet 50 mg  50 mg Oral Daily Enid Baas, MD   50 mg at 01/29/18 1034  . methocarbamol (ROBAXIN) tablet 500 mg  500 mg Oral TID Enid Baas, MD   500 mg at 01/29/18 0849  . methylPREDNISolone (MEDROL DOSEPAK) tablet 4 mg  4 mg Oral PC lunch Enid Baas, MD      . methylPREDNISolone (MEDROL DOSEPAK) tablet 4 mg  4 mg Oral PC supper Enid Baas, MD      . Melene Muller ON 01/30/2018] methylPREDNISolone (MEDROL DOSEPAK) tablet 4 mg  4 mg Oral 3 x daily with food Enid Baas, MD      . Melene Muller ON 01/31/2018] methylPREDNISolone (MEDROL DOSEPAK) tablet 4 mg   4 mg Oral 4X daily taper Enid Baas, MD      . methylPREDNISolone (MEDROL DOSEPAK) tablet 8 mg  8 mg Oral Nightly Enid Baas, MD      . Melene Muller ON 01/30/2018] methylPREDNISolone (MEDROL DOSEPAK) tablet 8 mg  8 mg Oral Nightly Enid Baas, MD      . metoprolol succinate (TOPROL-XL) 24 hr tablet 25 mg  25 mg Oral Daily Enid Baas, MD   25 mg at 01/29/18 1034  . morphine 2 MG/ML injection 2 mg  2 mg Intravenous Q4H PRN Enid Baas, MD   2 mg at 01/28/18 2316  . omega-3 acid ethyl esters (LOVAZA) capsule 1 g  1 g Oral Daily Enid Baas, MD   1 g at 01/29/18 0849  . ondansetron (ZOFRAN) tablet 4 mg  4 mg Oral Q6H PRN Enid Baas, MD       Or  . ondansetron (ZOFRAN) injection 4 mg  4 mg Intravenous Q6H PRN Enid Baas, MD      . polyethylene glycol (MIRALAX / GLYCOLAX) packet 17 g  17 g Oral Daily PRN Enid Baas, MD      . ticagrelor (BRILINTA) tablet 90 mg  90 mg Oral BID Enid Baas, MD   90 mg at 01/29/18 0851  . vitamin C (ASCORBIC ACID) tablet 250 mg  250 mg Oral Daily Enid Baas, MD   250 mg at 01/29/18 4098     Discharge Medications: Please see discharge summary for a list of discharge medications.  Relevant Imaging Results:  Relevant Lab Results:   Additional Information (SSN: [id number])  Kenslei Hearty, Darleen Crocker, LCSW

## 2018-01-29 NOTE — Clinical Social Work Note (Addendum)
Clinical Social Work Assessment  Patient Details  Name: Jesse Hodges MRN: [id number] Date of Birth: 17-May-1951  Date of referral:  01/29/18               Reason for consult:  Facility Placement                Permission sought to share information with:  Chartered certified accountant granted to share information::  Yes, Verbal Permission Granted  Name::      [place]::   [place]   Relationship::     Contact Information:     Housing/Transportation Living arrangements for the past 2 months:  [place] of Information:  Patient, Spouse Patient Interpreter Needed:  None Criminal Activity/Legal Involvement Pertinent to Current Situation/Hospitalization:  No - Comment as needed Significant Relationships:  Spouse Lives with:  Spouse Do you feel safe going back to the place where you live?  Yes Need for family participation in patient care:  Yes (Comment)  Care giving concerns:  Patient lives in [place] with his wife Jesse Hodges.    Social Worker assessment / plan:  Holiday representative (CSW) received verbal consult from PT that recommendation is SNF. CSW met with patient and his wife Jesse Hodges was at bedside. Patient was alert and oriented X4 and was laying in the bed. CSW introduced self and explained role of CSW department. Per patient he lives in [place] with his wife and she still works. CSW explained that [place] will have to approve SNF and he verbalized his understanding. Patient is agreeable to SNF search in [facility]. FL2 complete and faxed out.   CSW presented bed offers to patient and he chose [facility] and is okay with a semi-private room. Per [facility] admissions coordinator at [facility] she contacted [facility] and they said because patient is 67 y.o then his medicare is primary now. Per Jesse Hodges patient will require a 3 night qualifying inpatient stay in the hospital in order for medicare to pay for SNF. Patient was admitted to  inpatient on 01/28/18. CSW will continue to follow and assist as needed.   Patient and his wife are aware that medicare is primary insurance.   Employment status:  Retired Forensic scientist:  Managed Care PT Recommendations:  [place] / Referral to community resources:  [place]  Patient/Family's Response to care:  Patient is agreeable to D/C to [place].   Patient/Family's Understanding of and Emotional Response to Diagnosis, Current Treatment, and Prognosis:  Patient was very pleasant and thanked CSW for assistance.   Emotional Assessment Appearance:  Appears stated age Attitude/Demeanor/Rapport:    Affect (typically observed):  Accepting, Adaptable, Pleasant Orientation:  Oriented to Self, Oriented to Place, Oriented to  Time, Oriented to Situation Alcohol / Substance use:  Not Applicable Psych involvement (Current and /or in the community):  No (Comment)  Discharge Needs  Concerns to be addressed:  Discharge Planning Concerns Readmission within the last 30 days:  No Current discharge risk:  Dependent with Mobility Barriers to Discharge:  Continued Medical Work up   [employer], Jesse Beets, LCSW 01/29/2018, 2:31 PM

## 2018-01-29 NOTE — Consult Note (Signed)
Referring Physician:  No referring provider defined for this encounter.  Primary Physician:  Clayborn Heron, MD  Chief Complaint:  Back pain   History of Present Illness: Jesse Hodges is a 67 y.o. male who presents with the chief complaint of back pain that has been progressively worsening over the last couple of months (contributed to yard work). Until present, it has been manageable with OTC medications. Approximately 3 days ago however, he was unable to tolerate bearing weight and presented to the ED last night for intolerable pain. He was admitted to the floor for pain control. History of 3 prior lumbar surgeries with the last one being approximately 15 years ago.  Back pain currently rated 9/10 located low lumbar/sacral spine midline. When pain is severe, he experiences radiation into left hip that extends midway through lateral thigh, anterior and lateral calf, lateral foot, and into toes. Also states intermittent numbness/tingling through left lower extremities.  States intermittent numbness into groin when pain radiates through his thigh but denies bladder/bowel dysfunction or rectal numbness. Denies right lower extremity pain/numbness/tingling. Denies weakness (unable to bear weight due to worsening back pain).  Denies upper extremity symptoms.  Currently awaiting PT evaluation. Pain management attempted with norflex, morphine, solu-medrol, robaxin, toradol, dilaudid, Norco. Last dose lovenox last night at 2120, Brilinta 01/29/2018 0851. Medrol dosepack started this morning.    Review of Systems:  A 10 point review of systems is negative, except for the pertinent positives and negatives detailed in the HPI.  Past Medical History: Past Medical History:  Diagnosis Date  . Coronary artery disease   . DDD (degenerative disc disease), lumbar   . Dyslipidemia 07/16/2014  . Low back pain     Past Surgical History: Past Surgical History:  Procedure Laterality Date  . BACK  SURGERY    . CORONARY ANGIOPLASTY WITH STENT PLACEMENT  07/17/2014   "1"  . LEFT HEART CATHETERIZATION WITH CORONARY ANGIOGRAM N/A 07/17/2014   Procedure: LEFT HEART CATHETERIZATION WITH CORONARY ANGIOGRAM;  Surgeon: Othella Boyer, MD;  Location: [facility] CATH LAB;  Service: Cardiovascular;  Laterality: N/A;  . LUMBAR LAMINECTOMY  ~ 1995; ~ 2000; ~ 2002; ~ 2004  . SHOULDER ARTHROSCOPY Right 02/2007   adhesion release    Allergies: Allergies as of 01/28/2018  . (No Known Allergies)    Medications:  Current Facility-Administered Medications:  .  0.9 %  sodium chloride infusion, , Intravenous, Continuous, Kalisetti, Radhika, MD, Last Rate: 60 mL/hr at 01/28/18 2313 .  acetaminophen (TYLENOL) tablet 650 mg, 650 mg, Oral, Q6H PRN **OR** acetaminophen (TYLENOL) suppository 650 mg, 650 mg, Rectal, Q6H PRN, Enid Baas, MD .  aspirin EC tablet 81 mg, 81 mg, Oral, Daily, Enid Baas, MD, 81 mg at 01/29/18 0849 .  atorvastatin (LIPITOR) tablet 40 mg, 40 mg, Oral, Daily, Enid Baas, MD, 40 mg at 01/28/18 2315 .  docusate sodium (COLACE) capsule 100 mg, 100 mg, Oral, BID, Nemiah Commander, Radhika, MD, 100 mg at 01/29/18 0850 .  enoxaparin (LOVENOX) injection 40 mg, 40 mg, Subcutaneous, Q24H, Kalisetti, Radhika, MD, 40 mg at 01/28/18 2314 .  hydrochlorothiazide (MICROZIDE) capsule 12.5 mg, 12.5 mg, Oral, Daily, Nemiah Commander, Radhika, MD .  HYDROcodone-acetaminophen (NORCO/VICODIN) 5-325 MG per tablet 1-2 tablet, 1-2 tablet, Oral, Q4H PRN, Enid Baas, MD, 2 tablet at 01/29/18 0850 .  losartan (COZAAR) tablet 50 mg, 50 mg, Oral, Daily, Kalisetti, Radhika, MD .  methocarbamol (ROBAXIN) tablet 500 mg, 500 mg, Oral, TID, Enid Baas, MD, 500 mg at 01/29/18 0849 .  methylPREDNISolone (MEDROL DOSEPAK) tablet 4 mg, 4 mg, Oral, PC lunch, Enid Baas, MD .  methylPREDNISolone (MEDROL DOSEPAK) tablet 4 mg, 4 mg, Oral, PC supper, Enid Baas, MD .  Melene Muller ON 01/30/2018]  methylPREDNISolone (MEDROL DOSEPAK) tablet 4 mg, 4 mg, Oral, 3 x daily with food, Enid Baas, MD .  Melene Muller ON 01/31/2018] methylPREDNISolone (MEDROL DOSEPAK) tablet 4 mg, 4 mg, Oral, 4X daily taper, Enid Baas, MD .  methylPREDNISolone (MEDROL DOSEPAK) tablet 8 mg, 8 mg, Oral, Nightly, Enid Baas, MD .  Melene Muller ON 01/30/2018] methylPREDNISolone (MEDROL DOSEPAK) tablet 8 mg, 8 mg, Oral, Nightly, Kalisetti, Radhika, MD .  metoprolol succinate (TOPROL-XL) 24 hr tablet 25 mg, 25 mg, Oral, Daily, Kalisetti, Radhika, MD .  morphine 2 MG/ML injection 2 mg, 2 mg, Intravenous, Q4H PRN, Enid Baas, MD, 2 mg at 01/28/18 2316 .  omega-3 acid ethyl esters (LOVAZA) capsule 1 g, 1 g, Oral, Daily, Nemiah Commander, Radhika, MD, 1 g at 01/29/18 0849 .  ondansetron (ZOFRAN) tablet 4 mg, 4 mg, Oral, Q6H PRN **OR** ondansetron (ZOFRAN) injection 4 mg, 4 mg, Intravenous, Q6H PRN, Nemiah Commander, Radhika, MD .  polyethylene glycol (MIRALAX / GLYCOLAX) packet 17 g, 17 g, Oral, Daily PRN, Enid Baas, MD .  ticagrelor (BRILINTA) tablet 90 mg, 90 mg, Oral, BID, Enid Baas, MD, 90 mg at 01/29/18 0851 .  vitamin C (ASCORBIC ACID) tablet 250 mg, 250 mg, Oral, Daily, Nemiah Commander, Radhika, MD, 250 mg at 01/29/18 1610   Social History: Social History   Tobacco Use  . Smoking status: Never Smoker  . Smokeless tobacco: Never Used  Substance Use Topics  . Alcohol use: Yes    Alcohol/week: 0.0 oz    Comment: 07/17/2014 "a glass of wine a few times/yr"  . Drug use: No    Family Medical History: Family History  Problem Relation Age of Onset  . Hypertension Father     Physical Examination: Vitals:   01/28/18 2150 01/29/18 0815  BP: 140/86 133/81  Pulse: 73 67  Resp:  18  Temp: 98.1 F (36.7 C) 97.6 F (36.4 C)  SpO2: 98% 100%     General: Patient is well developed, well nourished, calm, collected, and in no apparent distress.  Psychiatric: Patient is non-anxious.  Head:  Pupils  equal, round, and reactive to light.  ENT:  Oral mucosa appears well hydrated.  Neck:   Supple.  Full range of motion.  Respiratory: Patient is breathing without any difficulty.  Extremities: No edema.  Vascular: Palpable pulses in dorsal pedal vessels.  Skin:   On exposed skin, there are no abnormal skin lesions.  NEUROLOGICAL:  General: In no acute distress.   Awake, alert, oriented to person, place, and time.  Pupils equal round and reactive to light.  Facial tone is symmetric.  There is no pronator drift.  Strength: Side Biceps Triceps Deltoid Interossei Grip Wrist Ext. Wrist Flex.  R 4* 4* 5 5 5 5 5   L 4* 4* 5 5 5 5 5   * limited due to back pain Side Iliopsoas Quads Hamstring PF DF EHL  R 5 5 5 5 5 5   L 5 5 5 5 5 5    Reflexes are 1+ and symmetric at the biceps, triceps, brachioradialis, patella and achilles.   Bilateral upper and lower extremity sensation is intact to light touch and pin prick.  Clonus - unable to assess due to guarding.  Toes are down-going.  Unable to assess gait.   Imaging: EXAM: MRI LUMBAR SPINE WITHOUT  CONTRAST  TECHNIQUE: Multiplanar, multisequence MR imaging of the lumbar spine was performed. No intravenous contrast was administered.  COMPARISON:  Lumbar spine CT 01/28/2018  FINDINGS: Segmentation:  Standard.  Alignment: Mild lumbar levoscoliosis. Straightening of the normal lumbar lordosis. Trace retrolisthesis of L5 on S1.  Vertebrae: No fracture or suspicious osseous lesion. Mixed type 1 and type 2 degenerative endplate changes at L3-4.  Conus medullaris and cauda equina: Conus extends to the L1 level. Conus and cauda equina appear normal.  Paraspinal and other soft tissues: Prominent lymphatics anterior to the lumbar spine in the retroperitoneum.  Disc levels:  Disc desiccation throughout the lumbar spine. Severe disc space narrowing asymmetrically greater on the right at L3-4 and L4-5 with milder narrowing at L2-3  and L5-S1.  T12-L1: Negative.  L1-2: Small left paracentral disc protrusion without stenosis.  L2-3: Mild circumferential disc bulging, small left foraminal disc protrusion, and mild facet arthrosis result in mild bilateral lateral recess stenosis, borderline to mild spinal stenosis, and minimal left neural foraminal narrowing.  L3-4: Prior left laminectomy. Circumferential disc bulging, a possible tiny left subarticular disc extrusion, and mild facet hypertrophy result in moderate bilateral lateral recess stenosis, mild spinal stenosis, and mild bilateral neural foraminal stenosis. Potential bilateral L4 nerve root impingement.  L4-5: Prior left laminectomy. Circumferential disc bulging, small superimposed right paracentral disc protrusion, and mild facet hypertrophy result in mild to moderate right and mild left lateral recess stenosis and mild bilateral neural foraminal stenosis without spinal stenosis.  L5-S1: Disc bulging results in mild left greater than right lateral recess stenosis and mild bilateral neural foraminal stenosis without spinal stenosis.  IMPRESSION: 1. Postoperative changes and advanced disc degeneration at L3-4 and L4-5 resulting in moderate lateral recess stenosis more notable at L3-4. Potential L4 nerve root impingement. 2. Mild multilevel neural foraminal stenosis. 3. Mild spinal stenosis at L2-3.   Electronically Signed   By: Sebastian AcheAllen  Grady M.D.   On: 01/28/2018 16:26  EXAM: CT LUMBAR SPINE WITHOUT CONTRAST  TECHNIQUE: Multidetector CT imaging of the lumbar spine was performed without intravenous contrast administration. Multiplanar CT image reconstructions were also generated.  COMPARISON:  Lumbar spine radiographs 01/28/2018  FINDINGS: Segmentation: Standard.  Alignment: Mild lumbar levoscoliosis. Straightening of the normal lumbar lordosis. Trace retrolisthesis of L5 on S1.  Vertebrae: No acute fracture or destructive  osseous process. Severe asymmetric right-sided disc space narrowing at L3-4 and L4-5 with vacuum disc. Mild disc space narrowing at L2-3 and L5-S1.  Paraspinal and other soft tissues: Minimal aortic atherosclerosis without aneurysm.  Disc levels:  L1-2: Mild disc bulging without stenosis.  L2-3: Circumferential disc bulging, annular calcification, and mild facet and ligamentum flavum hypertrophy result in mild spinal stenosis and minimal left neural foraminal stenosis.  L3-4: Prior left laminotomy. Circumferential disc bulging, endplate spurring, and mild facet hypertrophy result in right greater than left lateral recess stenosis and mild bilateral neural foraminal stenosis.  L4-5: Prior left laminectomy. Disc bulging, endplate spurring, and mild facet hypertrophy result in mild right neural foraminal stenosis and likely mild right lateral recess stenosis without significant spinal stenosis.  L5-S1: Disc bulging results in mild bilateral neural foraminal stenosis and likely mild left greater than right lateral recess stenosis without significant spinal stenosis.  IMPRESSION: 1. No acute osseous abnormality. 2. Postoperative changes and advanced disc degeneration at L3-4 and L4-5. 3. Mild multilevel neural foraminal and lateral recess stenosis. 4. Mild spinal stenosis at L2-3.   Electronically Signed   By: Jolaine ClickAllen  Grady M.D.  On: 01/28/2018 13:16  Assessment and Plan: Mr. Hoar is a pleasant 67 y.o. male with acute back pain and lumbar radiculopathy. Dr. Adriana Simas was consulted and medrol dosepack was recommended after he was able to review imaging. No weakness noted on physical exam, no current concerns for cauda equina. Surgical intervention not recommended at this time. Will speak to radiologist about possibility of ESI.   Ivar Drape, PA-C Dept. of Neurosurgery

## 2018-01-29 NOTE — Clinical Social Work Placement (Signed)
   CLINICAL SOCIAL WORK PLACEMENT  NOTE  Date:  01/29/2018  Patient Details  Name: Jesse Hodges MRN: [id number] Date of Birth: 05/21/1951  Clinical Social Work is seeking post-discharge placement for this patient at the Skilled  Nursing Facility level of care (*CSW will initial, date and re-position this form in  chart as items are completed):  Yes   Patient/family provided with [place] Clinical Social Work Department's list of facilities offering this level of care within the geographic area requested by the patient (or if unable, by the patient's family).  Yes   Patient/family informed of their freedom to choose among providers that offer the needed level of care, that participate in Medicare, Medicaid or managed care program needed by the patient, have an available bed and are willing to accept the patient.  Yes   Patient/family informed of [facility]'s ownership interest in [facility] and [facility], as well as of the fact that they are under no obligation to receive care at these facilities.  PASRR submitted to EDS on 01/29/18     PASRR number received on 01/29/18     Existing PASRR number confirmed on       FL2 transmitted to all facilities in geographic area requested by pt/family on 01/29/18     FL2 transmitted to all facilities within larger geographic area on       Patient informed that his/her managed care company has contracts with or will negotiate with certain facilities, including the following:        Yes   Patient/family informed of bed offers received.  Patient chooses bed at [facility]     Physician recommends and patient chooses bed at      Patient to be transferred to   on  .  Patient to be transferred to facility by       Patient family notified on   of transfer.  Name of family member notified:        PHYSICIAN       Additional Comment:    _______________________________________________ Amethyst Gainer, Darleen CrockerBailey M, LCSW 01/29/2018,  2:31 PM

## 2018-01-30 ENCOUNTER — Inpatient Hospital Stay (HOSPITAL_COMMUNITY)
Admission: AD | Admit: 2018-01-30 | Discharge: 2018-02-06 | DRG: 552 | Disposition: A | Discharge status: Skilled Nursing Facility | Payer: Medicare Other | Source: Other Acute Inpatient Hospital | Attending: Internal Medicine | Admitting: Internal Medicine

## 2018-01-30 DIAGNOSIS — M51369 Other intervertebral disc degeneration, lumbar region without mention of lumbar back pain or lower extremity pain: Secondary | ICD-10-CM

## 2018-01-30 DIAGNOSIS — L89321 Pressure ulcer of left buttock, stage 1: Secondary | ICD-10-CM | POA: Diagnosis not present

## 2018-01-30 DIAGNOSIS — M5442 Lumbago with sciatica, left side: Secondary | ICD-10-CM | POA: Diagnosis not present

## 2018-01-30 DIAGNOSIS — Z955 Presence of coronary angioplasty implant and graft: Secondary | ICD-10-CM | POA: Diagnosis not present

## 2018-01-30 DIAGNOSIS — E785 Hyperlipidemia, unspecified: Secondary | ICD-10-CM | POA: Diagnosis not present

## 2018-01-30 DIAGNOSIS — T40605A Adverse effect of unspecified narcotics, initial encounter: Secondary | ICD-10-CM | POA: Diagnosis present

## 2018-01-30 DIAGNOSIS — Z8249 Family history of ischemic heart disease and other diseases of the circulatory system: Secondary | ICD-10-CM

## 2018-01-30 DIAGNOSIS — G8929 Other chronic pain: Secondary | ICD-10-CM | POA: Diagnosis present

## 2018-01-30 DIAGNOSIS — I10 Essential (primary) hypertension: Secondary | ICD-10-CM | POA: Diagnosis present

## 2018-01-30 DIAGNOSIS — I2510 Atherosclerotic heart disease of native coronary artery without angina pectoris: Secondary | ICD-10-CM | POA: Diagnosis present

## 2018-01-30 DIAGNOSIS — M5416 Radiculopathy, lumbar region: Secondary | ICD-10-CM

## 2018-01-30 DIAGNOSIS — M549 Dorsalgia, unspecified: Secondary | ICD-10-CM | POA: Insufficient documentation

## 2018-01-30 DIAGNOSIS — M5116 Intervertebral disc disorders with radiculopathy, lumbar region: Principal | ICD-10-CM | POA: Diagnosis present

## 2018-01-30 DIAGNOSIS — K5903 Drug induced constipation: Secondary | ICD-10-CM

## 2018-01-30 DIAGNOSIS — M48061 Spinal stenosis, lumbar region without neurogenic claudication: Secondary | ICD-10-CM

## 2018-01-30 DIAGNOSIS — M5136 Other intervertebral disc degeneration, lumbar region: Secondary | ICD-10-CM | POA: Diagnosis not present

## 2018-01-30 DIAGNOSIS — M545 Low back pain, unspecified: Secondary | ICD-10-CM | POA: Diagnosis present

## 2018-01-30 DIAGNOSIS — L8990 Pressure ulcer of unspecified site, unspecified stage: Secondary | ICD-10-CM

## 2018-01-30 LAB — HIV ANTIBODY (ROUTINE TESTING W REFLEX): HIV Screen 4th Generation wRfx: NONREACTIVE

## 2018-01-30 MED ORDER — METHYLPREDNISOLONE 4 MG PO TBPK
8.0000 mg | ORAL_TABLET | Freq: Every morning | ORAL | Status: AC
  Administered 2018-01-30: 8 mg via ORAL
  Filled 2018-01-30: qty 21

## 2018-01-30 MED ORDER — METHYLPREDNISOLONE 4 MG PO TBPK
4.0000 mg | ORAL_TABLET | ORAL | Status: DC
Start: 2018-01-30 — End: 2018-01-30

## 2018-01-30 MED ORDER — HYDROCODONE-ACETAMINOPHEN 10-325 MG PO TABS
1.0000 | ORAL_TABLET | ORAL | Status: DC | PRN
  Administered 2018-01-31 (×3): 2 via ORAL
  Filled 2018-01-30 (×3): qty 2

## 2018-01-30 MED ORDER — SENNOSIDES-DOCUSATE SODIUM 8.6-50 MG PO TABS
2.0000 | ORAL_TABLET | Freq: Two times a day (BID) | ORAL | Status: DC
  Administered 2018-01-30 – 2018-02-06 (×14): 2 via ORAL
  Filled 2018-01-30 (×14): qty 2

## 2018-01-30 MED ORDER — GABAPENTIN 100 MG PO CAPS: 200.0000 mg | ORAL_CAPSULE | Freq: Three times a day (TID) | ORAL | Status: DC

## 2018-01-30 MED ORDER — LOSARTAN POTASSIUM 50 MG PO TABS
50.0000 mg | ORAL_TABLET | Freq: Every day | ORAL | Status: DC
  Administered 2018-01-31 – 2018-02-06 (×7): 50 mg via ORAL
  Filled 2018-01-30 (×8): qty 1

## 2018-01-30 MED ORDER — METHYLPREDNISOLONE 4 MG PO TBPK
4.0000 mg | ORAL_TABLET | ORAL | Status: AC
  Administered 2018-01-30: 4 mg via ORAL

## 2018-01-30 MED ORDER — CYCLOBENZAPRINE HCL 10 MG PO TABS
5.0000 mg | ORAL_TABLET | Freq: Three times a day (TID) | ORAL | Status: DC | PRN
  Administered 2018-02-01: 5 mg via ORAL
  Filled 2018-01-30: qty 1

## 2018-01-30 MED ORDER — ENOXAPARIN SODIUM 40 MG/0.4ML ~~LOC~~ SOLN
40.0000 mg | SUBCUTANEOUS | Status: DC
  Administered 2018-01-30: 40 mg via SUBCUTANEOUS
  Filled 2018-01-30 (×2): qty 0.4

## 2018-01-30 MED ORDER — METHYLPREDNISOLONE 4 MG PO TBPK
8.0000 mg | ORAL_TABLET | Freq: Every evening | ORAL | Status: AC
  Administered 2018-01-30: 8 mg via ORAL

## 2018-01-30 MED ORDER — METHYLPREDNISOLONE 4 MG PO TBPK
8.0000 mg | ORAL_TABLET | Freq: Every evening | ORAL | Status: AC
Start: 2018-01-31 — End: 2018-01-31
  Administered 2018-01-31: 8 mg via ORAL

## 2018-01-30 MED ORDER — METHYLPREDNISOLONE 4 MG PO TBPK: 8.0000 mg | ORAL_TABLET | Freq: Every evening | ORAL | Status: DC

## 2018-01-30 MED ORDER — NITROGLYCERIN 0.4 MG SL SUBL: 0.4000 mg | SUBLINGUAL_TABLET | SUBLINGUAL | Status: DC | PRN

## 2018-01-30 MED ORDER — METHYLPREDNISOLONE 4 MG PO TBPK: 4.0000 mg | ORAL_TABLET | Freq: Four times a day (QID) | ORAL | Status: DC

## 2018-01-30 MED ORDER — GABAPENTIN 600 MG PO TABS
600.0000 mg | ORAL_TABLET | Freq: Three times a day (TID) | ORAL | Status: DC
  Administered 2018-01-30 (×2): 600 mg via ORAL
  Filled 2018-01-30 (×3): qty 1

## 2018-01-30 MED ORDER — METHYLPREDNISOLONE 4 MG PO TBPK: 4.0000 mg | ORAL_TABLET | Freq: Three times a day (TID) | ORAL | Status: DC

## 2018-01-30 MED ORDER — ATORVASTATIN CALCIUM 40 MG PO TABS
40.0000 mg | ORAL_TABLET | Freq: Every day | ORAL | Status: DC
  Administered 2018-01-31 – 2018-02-06 (×7): 40 mg via ORAL
  Filled 2018-01-30 (×8): qty 1

## 2018-01-30 MED ORDER — HYDROCHLOROTHIAZIDE 12.5 MG PO CAPS
12.5000 mg | ORAL_CAPSULE | Freq: Every day | ORAL | Status: DC
  Administered 2018-01-31 – 2018-02-06 (×7): 12.5 mg via ORAL
  Filled 2018-01-30 (×8): qty 1

## 2018-01-30 MED ORDER — HYDROMORPHONE HCL 1 MG/ML IJ SOLN
1.0000 mg | INTRAMUSCULAR | Status: DC | PRN
  Administered 2018-01-30: 1 mg via INTRAVENOUS
  Administered 2018-01-31: 0.5 mg via INTRAVENOUS
  Administered 2018-02-01 – 2018-02-05 (×3): 1 mg via INTRAVENOUS
  Filled 2018-01-30 (×6): qty 1

## 2018-01-30 MED ORDER — METHYLPREDNISOLONE 4 MG PO TBPK
4.0000 mg | ORAL_TABLET | ORAL | Status: AC
  Administered 2018-01-30: 4 mg via ORAL
  Filled 2018-01-30: qty 21

## 2018-01-30 MED ORDER — METHYLPREDNISOLONE 4 MG PO TBPK
8.0000 mg | ORAL_TABLET | Freq: Every morning | ORAL | Status: DC
Start: 2018-01-30 — End: 2018-01-30
  Filled 2018-01-30: qty 21

## 2018-01-30 MED ORDER — METHYLPREDNISOLONE 4 MG PO TBPK
4.0000 mg | ORAL_TABLET | Freq: Three times a day (TID) | ORAL | Status: AC
  Administered 2018-01-31 (×3): 4 mg via ORAL

## 2018-01-30 MED ORDER — GABAPENTIN 100 MG PO CAPS
100.0000 mg | ORAL_CAPSULE | Freq: Three times a day (TID) | ORAL | Status: DC
  Administered 2018-01-30 – 2018-02-06 (×19): 100 mg via ORAL
  Filled 2018-01-30 (×19): qty 1

## 2018-01-30 MED ORDER — MORPHINE SULFATE (PF) 2 MG/ML IV SOLN
2.0000 mg | INTRAVENOUS | Status: DC | PRN
  Administered 2018-01-30 (×3): 2 mg via INTRAVENOUS
  Filled 2018-01-30 (×2): qty 1

## 2018-01-30 MED ORDER — METHYLPREDNISOLONE 4 MG PO TBPK
4.0000 mg | ORAL_TABLET | Freq: Four times a day (QID) | ORAL | Status: AC
  Administered 2018-02-01 – 2018-02-04 (×10): 4 mg via ORAL

## 2018-01-30 NOTE — Progress Notes (Signed)
PT Cancellation Note  Patient Details Name: Jesse Hodges MRN: [id number] DOB: 04/23/1951   Cancelled Treatment:    Reason Eval/Treat Not Completed: Other (comment). Pt with 8/10 across low back pain ("stuck" in a tight hook lying position). Pt/spouse note pt is [facility] later today. Pt/spouse do not wish any attempted therapy or interventions until transferred. Complete current PT orders.    Scot DockHeidi E Barnes, PTA 01/30/2018, 12:47 PM

## 2018-01-30 NOTE — Progress Notes (Addendum)
[id number]: [facility] called for possible transfer of this patient.  Patient with DDD/spinal stenosis with significant pain.  Neurosurgery has evaluated the patient and recommends Medrol dosepack and possibly epidural steroid injections.  He is having refractory pain.  They do not have interventional radiology available at [facility].  Family is also interested in having him transferred to [facility] since [place] are in [place].  Given that this is likely a one-time injection without further need for services not available at [facility], I notified Dr. Tobi BastosPyreddy that I will call and speak with IR here and call him back.  I spoke with Dr. Fredia SorrowYamagata about this patient.  IR prefers not to do epidural injections as an inpatient - this is an outpatient procedure that they do at the spine center [facility].  They recommend adequate pain management and outpatient epidural injections - a total of 3 injections a few weeks apart.  Orthopedics or neurosurgery could consider injecting him at [facility].  He could potentially have an injection done at [facility] tomorrow or Friday but there is no guarantee that this could happen - these are neuroradiologists and IR would have to squeeze the patient in; it would be more likely to happen on Friday.    Based on discussion with IR, we recommend pain management consultation for improved pain control.  If he is not improving by tomorrow, consideration could be made for transfer Thursday or Friday.  However, outpatient ESI is the preferred method if possible.   Addendum at 10:58: Patient's wife was extremely unhappy with the plan to delay transfer.  She understands that transport may be exceedingly uncomfortable but desires ASAP transfer.  As a result, I have accepted the patient for transfer today to a [facility].  Further pain control and IR consultation will be done upon patient arrival.  Jonah BlueJennifer Stacee Earp, M.D.

## 2018-01-30 NOTE — H&P (Signed)
History and Physical    Jesse Hodges:[id number] DOB: Jun 25, 1951 DOA: 01/30/2018  PCP: Clayborn Heron, MD Consultants:  None Patient coming from:  Home - lives with wife; NOK: wife   Chief Complaint: Intractable back pain  HPI: Jesse Hodges is a 67 y.o. 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 He presented to [facility] Monday 7/29 with acute lower back pain. Pt was unable to get out of the bed that morning as he was "locked up." He states that since his most recent surgery 15 years ago he has done quite well but has had some increasing pain for the past 2-3 months, for which he takes ibuprofen prn. Reports that he mowed the yard on Sunday and woke up Monday morning with the severe pain and inability to walk. He was admitted to [facility] 7/29 and was transferred per his and his wife's request here today. Family frustrated he has not had much of anything done. This morning, pt was on the bedpan and was lowering down and felt his back bend back in an abnormal way. States that almost immediately, he had a change in location of the pain from just the L side and L hip and leg, to now going across the midline to the R. No bladder or bowel incontinence. On occasion his L leg will be tingling, and he will lose sensation. He also gets leg muscle spasms that are very painful. They have been asking for nerve pain med, and today he got first dose of neurontin. He was also started on a medrol dose pak on Monday. Muscle relaxers haven't seemed to help but given the number of meds he's been on it's hard to tell what has helped and what hasn't. Seen by NSGY at [facility] who recommended epidural injections, for which pt and his wife wanted to be transferred to [facility].  Had morphine, vicodin  today, prior to transfer and during transport. Pain got down to 3-4 after taking 2 vicodin today which is best it's been since Monday. Currently is at a 7. Holding L knee in flexion helps with the pinching/ sciatic pain temporarily.  Pt's last back surgery was 22 y ago by Dr. Trey Sailors in [place], [place].  Please see note by Dr. Ophelia Charter from today regarding decision making timeline - ultimately pt was transferred here because they hope IR can do epidural spinal injection as inpatient; however even if this cannot be done, they wanted to be transferred here due to the frustration given what has and hasn't occurred at OSH since his admission.  Review of Systems: As per HPI; otherwise review of systems reviewed and negative other than: constipation since Sunday likely due to opiate pain meds  Ambulatory Status:  Ambulated without assistance prior to current episode; now is nonambulatory.   Past Medical History:  Diagnosis Date  . Coronary artery disease   . DDD (degenerative disc disease), lumbar   . Dyslipidemia 07/16/2014  . Low back pain     Past Surgical History:  Procedure Laterality Date  . BACK SURGERY    . CORONARY ANGIOPLASTY WITH STENT PLACEMENT  07/17/2014   "1"  . LEFT HEART CATHETERIZATION WITH CORONARY ANGIOGRAM N/A 07/17/2014   Procedure: LEFT HEART CATHETERIZATION WITH CORONARY ANGIOGRAM;  Surgeon: Othella Boyer,  MD;  Location: [facility] CATH LAB;  Service: Cardiovascular;  Laterality: N/A;  . LUMBAR LAMINECTOMY  ~ 1995; ~ 2000; ~ 2002; ~ 2004  . SHOULDER ARTHROSCOPY Right 02/2007   adhesion release    Social History   Socioeconomic History  . Marital status: Married    Spouse name: Not on file  . Number of children: Not on file  . Years of education: Not on file  . Highest education level: Not on file  Occupational History  . Not on file  Social Needs  . Financial resource strain: Not on file  . Food insecurity:    Worry: Not on file    Inability: Not on file  .  Transportation needs:    Medical: Not on file    Non-medical: Not on file  Tobacco Use  . Smoking status: Never Smoker  . Smokeless tobacco: Never Used  Substance and Sexual Activity  . Alcohol use: Yes    Alcohol/week: 0.0 oz    Comment: 07/17/2014 "a glass of wine a few times/yr"  . Drug use: No  . Sexual activity: Yes  Lifestyle  . Physical activity:    Days per week: Not on file    Minutes per session: Not on file  . Stress: Not on file  Relationships  . Social connections:    Talks on phone: Not on file    Gets together: Not on file    Attends religious service: Not on file    Active member of club or organization: Not on file    Attends meetings of clubs or organizations: Not on file    Relationship status: Not on file  . Intimate partner violence:    Fear of current or ex partner: Not on file    Emotionally abused: Not on file    Physically abused: Not on file    Forced sexual activity: Not on file  Other Topics Concern  . Not on file  Social History Narrative   Independent at baseline.  Lives at home with family    No Known Allergies  Family History  Problem Relation Age of Onset  . Hypertension Father     Prior to Admission medications   Medication Sig Start Date End Date Taking? Authorizing Provider  atorvastatin (LIPITOR) 40 MG tablet Take 1 tablet (40 mg total) by mouth daily. 07/19/14   Othella Boyerilley, William S, MD  hydrochlorothiazide (MICROZIDE) 12.5 MG capsule Take 12.5 mg by mouth daily.    [provider]  losartan (COZAAR) 50 MG tablet Take 50 mg by mouth daily.    [provider]  NITROSTAT 0.4 MG SL tablet Place 0.4 mg under the tongue every 5 (five) minutes as needed for chest pain.  07/08/14   [provider]    Physical Exam: There were no vitals filed for this visit.   . General:  Appears calm and comfortable and is NAD . Eyes:  PERRL, EOMI, normal lids, iris . ENT:  grossly normal hearing, lips & tongue, mmm;  appropriate dentition . Neck:  no LAD, masses or thyromegaly; no carotid bruits . Cardiovascular:  RRR, no m/r/g. No LE edema.  Marland Kitchen. Respiratory:   CTA bilaterally with no wheezes/rales/rhonchi.  Normal respiratory effort. . Abdomen:  soft, NT, ND, NABS . Back:  Limited exam as pt was unable to fully roll over, was unable to sit up. He has point tenderness around L4 and spasm of the paravertebral muscles bilaterally in the lumbar spine.  . Skin:  no rash or induration seen on limited exam . Musculoskeletal:  grossly normal tone BUE/BLE, good ROM, no bony abnormality . Lower extremity:  No LE edema.  Limited foot exam with no ulcerations.  2+ distal pulses. Marland Kitchen Psychiatric:  grossly normal mood and affect, speech fluent and appropriate, AOx3 . Neurologic:  CN 2-12 grossly intact, moves all extremities in coordinated fashion, sensation intact bilateral feet/legs. Dorsiflexion and plantarflexion 5/5. Pt prefers to keep knees bent at 75 degree angle to minimize lordosis of his lumbar spine which causes increased pain. Normal reflexes.    Radiological Exams on Admission: No results found.   Labs on Admission: I have personally reviewed the available labs and imaging studies at the time of the admission.  Pertinent labs from [facility] Na 138 K 4.2 Cl 106 CO2 25 Glucose 194 BUN 25 Creat 0.95 Ca 9.1  WBC 8.7 Hgb 14.8 HCT 41.7 Plt 185  MR lumbar spine 7/29 Postop changes and advanced DDD at L3-4 and L4-5 resulting in moderate lateral recess stenosis more notable at L3-4. Potential L4 nerve root impingement. Mild multilevel neural foraminal stenosis. Mild spinal stenosis at L2-3    Assessment/Plan Principal Problem:   Low back pain Active Problems:   Dyslipidemia   CAD (coronary artery disease), native coronary artery   Chronic left lumbar radiculopathy   DDD (degenerative disc disease), lumbar   Spinal stenosis of lumbar region   Constipation due to pain medication therapy   Acute  lumbar back pain with radiculopathy -pt with known severe DDD at L3-L4 and L4-L5, spinal stenosis -recent exacerbation due to strenuous activity -transferred here for possible epidural steroid injection --> consult IR in morning. He is NPO in case this is required. -lovenox for DVT ppx given tonight; further doses held for now in case of invasive procedure -hold ASA for same reason -given that vicodin seemed to provide most relief, ordered here; also will order dilaudid IV 1 mg for breakthrough pain, flexeril for muscle spasm, neurontin for nerve pain (100 mg TID, can titrate up as tolerated) -cont medrol dose pack  CAD (chronic, stable), dyslipidemia, HTN -cont statin -cont BB, ARB, HCTZ -hold ASA in case of procedure  Opiate-induced constipation -senna/colace BID    DVT prophylaxis:  Lovenox (given on 7/31, now held) Code Status:  Full - confirmed with patient/family Family Communication: wife at bedside  Disposition Plan: Home once clinically improved Consults called: None; Needs IR consult tomorrow 8/1  Admission status: Admit - It is my clinical opinion that admission to INPATIENT is reasonable and necessary because of the expectation that this patient will require hospital care that crosses at least 2 midnights to treat this condition based on the medical complexity of the problems presented.  Given the aforementioned information, the predictability of an adverse outcome is felt to be significant.     Elyse Hsu MD [facility]  If note is complete, please contact covering daytime or nighttime physician. [email or web address] Password [facility]  01/30/2018, 5:39 PM

## 2018-01-30 NOTE — Progress Notes (Signed)
[facility] transportation here for patient. Morphine given prior to transfer. VS stable. MD notified.  Report was [facility] to [facility].

## 2018-01-30 NOTE — Discharge Summary (Signed)
[facility] [place] [facility]   PATIENT NAME: Jesse Hodges    MR#:  [id number]  DATE OF BIRTH:  03/18/1951  DATE OF ADMISSION:  01/28/2018 ADMITTING PHYSICIAN: Enid Baasadhika Kalisetti, MD  DATE OF DISCHARGE: 01/30/2018  PRIMARY CARE PHYSICIAN: Rankins, Fanny DanceVictoria R, MD   ADMISSION DIAGNOSIS:  Cannot walk [R26.2] Radicular pain of lumbosacral region [M54.17] Intractable pain [R52]  DISCHARGE DIAGNOSIS:  Active Problems:   Low back pain Ambulatory dysfunction Lumbar radiculopathy Intractable back pain Spinal stenosis L3-L4, L4-L5 advanced disc degeneration  SECONDARY DIAGNOSIS:   Past Medical History:  Diagnosis Date  . Coronary artery disease   . DDD (degenerative disc disease), lumbar   . Dyslipidemia 07/16/2014  . Low back pain      ADMITTING HISTORY Jesse Ivoryhomas Weekly  is a 67 y.o. male with a known history of CAD status post PCI, low back pain status post lumbar surgeries, degenerative disc disease presents to hospital secondary to worsening of low back pain and inability to walk for the last 2 days.Since his last back surgery about 10 to 15 years ago, patient has been doing well, takes as needed over-the-counter pain medicine.  But for the last 2 weeks his back has been hurting more since he was mowing lawn.  He has been using Advil as needed.  But he was still able to function as the pain improved by later in the day.  In the last 2 days he has gotten to the point that the pain medication was not helping and he was even unable to ambulate in the house.  Denies any pain shooting down his legs.  Denies any bowel or bladder incontinence.  He received several doses of IV pain medication here in the hospital and is being admitted for the same.  MRI of the lumbar spine has extensive degenerative disc disease but did not reveal any cord compression.  HOSPITAL COURSE:  Patient was admitted to medical floor at our hospital.  Patient was started on Medrol Dosepak along with  IV morphine as needed and oral narcotics for pain control.   patient was evaluated by neurosurgery in our hospital who recommended epidural injections.  Patient continues to have intractable back pain with severe radiculopathy.  Unable to stand and walk secondary to severe back pain radiating down the left leg.  Was also started on Neurontin.  Aspirin and Brilinta were held in view of interventional procedure for the pain relief.  No bowel bladder incontinence. patient's family wanted him to be transferred to [facility] for epidural injections by interventional radiology.  Case was discussed with the medical attending Dr. Ophelia CharterYates at [facility].  Patient has been accepted, he will be transferred to [facility] .  Transfer plan discussed with family in detail. Imaging studies done in our hospital CT lumbar spine MRI lumbar spine  CONSULTS OBTAINED:  Treatment Team:  Lucy Chrisook, Steven, MD  DRUG ALLERGIES:  No Known Allergies  DISCHARGE MEDICATIONS:   Allergies as of 01/30/2018   No Known Allergies     Medication List    STOP taking these medications   aspirin 81 MG tablet   FISH OIL PO   metoprolol succinate 25 MG 24 hr tablet Commonly known as:  TOPROL-XL   ticagrelor 90 MG Tabs tablet Commonly known as:  BRILINTA   VITAMIN C PO     TAKE these medications   atorvastatin 40 MG tablet Commonly known as:  LIPITOR Take 1 tablet (40 mg total)  by mouth daily.   hydrochlorothiazide 12.5 MG capsule Commonly known as:  MICROZIDE Take 12.5 mg by mouth daily.   losartan 50 MG tablet Commonly known as:  COZAAR Take 50 mg by mouth daily.   NITROSTAT 0.4 MG SL tablet Generic drug:  nitroGLYCERIN Place 0.4 mg under the tongue every 5 (five) minutes as needed for chest pain.     Neurontin 600 mg orally every 8 hourly IV morphine 2 mg every 2 as needed pain Oral Hydrocodone/Acetaminophen 10 / 325 mg every 4 hours  for pain Medrol dose pack  Today  Patient seen  and evaluated on the day transfer Has severe back pain  unable to stand up Has pain in the lower back radiating to the left leg   VITAL SIGNS:  Blood pressure (!) 157/97, pulse 66, temperature 97.7 F (36.5 C), temperature source Oral, resp. rate 18, height 6\' 1"  (1.854 m), weight 90.7 kg (200 lb), SpO2 99 %.  I/O:    Intake/Output Summary (Last 24 hours) at 01/30/2018 1123 Last data filed at 01/30/2018 0040 Gross per 24 hour  Intake 1477 ml  Output 1400 ml  Net 77 ml    PHYSICAL EXAMINATION:  Physical Exam  GENERAL:  67 y.o.-year-old patient lying in the bed with no acute distress.  LUNGS: Normal breath sounds bilaterally, no wheezing, rales,rhonchi or crepitation. No use of accessory muscles of respiration.  CARDIOVASCULAR: S1, S2 normal. No murmurs, rubs, or gallops.  ABDOMEN: Soft, non-tender, non-distended. Bowel sounds present. No organomegaly or mass.  NEUROLOGIC: Moves all 4 extremities. PSYCHIATRIC: The patient is alert and oriented x 3.  Musculoskeletal : Has tenderness lower back Radiculopathic pain left leg SKIN: No obvious rash, lesion, or ulcer.   DATA REVIEW:   CBC Recent Labs  Lab 01/29/18 0334  WBC 8.7  HGB 14.8  HCT 41.7  PLT 185    Chemistries  Recent Labs  Lab 01/29/18 0334  NA 138  K 4.2  CL 106  CO2 25  GLUCOSE 194*  BUN 25*  CREATININE 0.95  CALCIUM 9.1    Cardiac Enzymes No results for input(s): TROPONINI in the last 168 hours.  Microbiology Results  No results found for this or any previous visit.  RADIOLOGY:  Dg Lumbar Spine Complete  Result Date: 01/28/2018 CLINICAL DATA:  Acute left-sided low back pain after mowing lawn. EXAM: LUMBAR SPINE - COMPLETE 4+ VIEW COMPARISON:  None. FINDINGS: Mild levoscoliosis of lumbar spine is noted. No fracture or spondylolisthesis is noted. Moderate degenerative disc disease is noted at L2-3, L3-4 and L4-5 with anterior osteophyte formation. IMPRESSION: Moderate multilevel degenerative disc  disease. No acute abnormality seen in the lumbar spine. Electronically Signed   By: Lupita Raider, M.D.   On: 01/28/2018 12:40   Ct Lumbar Spine Wo Contrast  Result Date: 01/28/2018 CLINICAL DATA:  Low back pain with bilateral leg pain and weakness beginning after mowing the yard 2 days ago. EXAM: CT LUMBAR SPINE WITHOUT CONTRAST TECHNIQUE: Multidetector CT imaging of the lumbar spine was performed without intravenous contrast administration. Multiplanar CT image reconstructions were also generated. COMPARISON:  Lumbar spine radiographs 01/28/2018 FINDINGS: Segmentation: Standard. Alignment: Mild lumbar levoscoliosis. Straightening of the normal lumbar lordosis. Trace retrolisthesis of L5 on S1. Vertebrae: No acute fracture or destructive osseous process. Severe asymmetric right-sided disc space narrowing at L3-4 and L4-5 with vacuum disc. Mild disc space narrowing at L2-3 and L5-S1. Paraspinal and other soft tissues: Minimal aortic atherosclerosis without aneurysm. Disc levels: L1-2: Mild  disc bulging without stenosis. L2-3: Circumferential disc bulging, annular calcification, and mild facet and ligamentum flavum hypertrophy result in mild spinal stenosis and minimal left neural foraminal stenosis. L3-4: Prior left laminotomy. Circumferential disc bulging, endplate spurring, and mild facet hypertrophy result in right greater than left lateral recess stenosis and mild bilateral neural foraminal stenosis. L4-5: Prior left laminectomy. Disc bulging, endplate spurring, and mild facet hypertrophy result in mild right neural foraminal stenosis and likely mild right lateral recess stenosis without significant spinal stenosis. L5-S1: Disc bulging results in mild bilateral neural foraminal stenosis and likely mild left greater than right lateral recess stenosis without significant spinal stenosis. IMPRESSION: 1. No acute osseous abnormality. 2. Postoperative changes and advanced disc degeneration at L3-4 and L4-5. 3.  Mild multilevel neural foraminal and lateral recess stenosis. 4. Mild spinal stenosis at L2-3. Electronically Signed   By: Sebastian Ache M.D.   On: 01/28/2018 13:16   Mr Lumbar Spine Wo Contrast  Result Date: 01/28/2018 CLINICAL DATA:  Low back pain radiating to the left knee. Prior lumbar surgery. EXAM: MRI LUMBAR SPINE WITHOUT CONTRAST TECHNIQUE: Multiplanar, multisequence MR imaging of the lumbar spine was performed. No intravenous contrast was administered. COMPARISON:  Lumbar spine CT 01/28/2018 FINDINGS: Segmentation:  Standard. Alignment: Mild lumbar levoscoliosis. Straightening of the normal lumbar lordosis. Trace retrolisthesis of L5 on S1. Vertebrae: No fracture or suspicious osseous lesion. Mixed type 1 and type 2 degenerative endplate changes at L3-4. Conus medullaris and cauda equina: Conus extends to the L1 level. Conus and cauda equina appear normal. Paraspinal and other soft tissues: Prominent lymphatics anterior to the lumbar spine in the retroperitoneum. Disc levels: Disc desiccation throughout the lumbar spine. Severe disc space narrowing asymmetrically greater on the right at L3-4 and L4-5 with milder narrowing at L2-3 and L5-S1. T12-L1: Negative. L1-2: Small left paracentral disc protrusion without stenosis. L2-3: Mild circumferential disc bulging, small left foraminal disc protrusion, and mild facet arthrosis result in mild bilateral lateral recess stenosis, borderline to mild spinal stenosis, and minimal left neural foraminal narrowing. L3-4: Prior left laminectomy. Circumferential disc bulging, a possible tiny left subarticular disc extrusion, and mild facet hypertrophy result in moderate bilateral lateral recess stenosis, mild spinal stenosis, and mild bilateral neural foraminal stenosis. Potential bilateral L4 nerve root impingement. L4-5: Prior left laminectomy. Circumferential disc bulging, small superimposed right paracentral disc protrusion, and mild facet hypertrophy result in mild  to moderate right and mild left lateral recess stenosis and mild bilateral neural foraminal stenosis without spinal stenosis. L5-S1: Disc bulging results in mild left greater than right lateral recess stenosis and mild bilateral neural foraminal stenosis without spinal stenosis. IMPRESSION: 1. Postoperative changes and advanced disc degeneration at L3-4 and L4-5 resulting in moderate lateral recess stenosis more notable at L3-4. Potential L4 nerve root impingement. 2. Mild multilevel neural foraminal stenosis. 3. Mild spinal stenosis at L2-3. Electronically Signed   By: Sebastian Ache M.D.   On: 01/28/2018 16:26    Follow up with PCP in 1 week.  Management plans discussed with the patient, family and they are in agreement.  CODE STATUS: Full code    Code Status Orders  (From admission, onward)        Start     Ordered   01/28/18 2233  Full code  Continuous     01/28/18 2233    Code Status History    Date Active Date Inactive Code Status Order ID Comments User Context   07/17/2014 1434 07/19/2014 1433 Full Code [id number]  Lyn Records, MD Inpatient    Advance Directive Documentation     Most Recent Value  Type of Advance Directive  Living will  Pre-existing out of facility DNR order (yellow form or pink MOST form)  -  "MOST" Form in Place?  -      TOTAL TIME TAKING CARE OF THIS PATIENT ON DAY OF DISCHARGE: more than 33 minutes.   Ihor Austin M.D on 01/30/2018 at 11:23 AM  Between 7am to 6pm - Pager - [phone]  After 6pm go to [email or web address] - password [email or web address] [facility]  [facility] [place] [facility]  Office  [phone]  CC: Primary care physician; Clayborn Heron, MD  Note: This dictation was prepared with Dragon dictation along with smaller phrase technology. Any transcriptional errors that result from this process are unintentional.

## 2018-01-30 NOTE — Progress Notes (Signed)
Pt admitted to [facility] from [facility] where he was admitted on Monday for severe back pain. He is alert and oriented.  States back pain is a 7/10 and "climbing."  Medicated prior to transport. Family at bedside.  Bed in lowest position, bed alarm set and call bell within reach.  Pt verbalizes understanding to call for any needs.

## 2018-01-31 ENCOUNTER — Encounter (HOSPITAL_COMMUNITY): Payer: Self-pay

## 2018-01-31 ENCOUNTER — Other Ambulatory Visit: Payer: Self-pay

## 2018-01-31 ENCOUNTER — Encounter
Admission: RE | Admit: 2018-01-31 | Payer: Federal, State, Local not specified - PPO | Source: Ambulatory Visit | Admitting: Internal Medicine

## 2018-01-31 DIAGNOSIS — M5416 Radiculopathy, lumbar region: Secondary | ICD-10-CM

## 2018-01-31 MED ORDER — IBUPROFEN 200 MG PO TABS
400.0000 mg | ORAL_TABLET | Freq: Three times a day (TID) | ORAL | Status: DC
  Administered 2018-01-31 – 2018-02-06 (×18): 400 mg via ORAL
  Filled 2018-01-31 (×18): qty 2

## 2018-01-31 MED ORDER — PANTOPRAZOLE SODIUM 40 MG PO TBEC
40.0000 mg | DELAYED_RELEASE_TABLET | Freq: Every day | ORAL | Status: DC
  Administered 2018-01-31 – 2018-02-06 (×7): 40 mg via ORAL
  Filled 2018-01-31 (×7): qty 1

## 2018-01-31 MED ORDER — LIDOCAINE 5 % EX PTCH
1.0000 | MEDICATED_PATCH | CUTANEOUS | Status: DC
  Administered 2018-01-31 – 2018-02-05 (×6): 1 via TRANSDERMAL
  Filled 2018-01-31 (×6): qty 1

## 2018-01-31 NOTE — Plan of Care (Signed)
Jesse Hodges is still in significant pain today.  He has not been out of bed.  PT/OT will work with him during his admission.  His pain is reasonably well controlled with the available medications. However, the patient repositions himself to control nerve pressures in his back.  At this time, there is no neurosurgery intervention planned at this time.  Nursing POC:  Pain management, supporting interdisciplinary teams in physical therapy and progressive mobility.

## 2018-01-31 NOTE — Progress Notes (Addendum)
PROGRESS NOTE    Jesse Hodges  ZOX:[id number] DOB: 10/16/1950 DOA: 01/30/2018 PCP: Clayborn Heronankins, Victoria R, MD  Outpatient Specialists:     Brief Narrative:  67 yo male with a PMH of CAD, PCI (Jan 2016), multiple lumbar surgeries, DDD of L3-L4 and L4-L5 with radiculopathy, and spinal stenosis is here because of complaints of acute onset of severe lumbar back pain x4 days now. The patient was originally seen at [facility]. He was evaluated by neurosurgery who recommended him to receive epidural injections. Patient [facility] the [facility] of his hospital stay. MR Lumbar Spine - 1. Postoperative changes and advanced disc degeneration at L3-4 and L4-5 resulting in moderate lateral recess stenosis more notable at L3-4. Potential L4 nerve root impingement. 2. Mild multilevel neural foraminal stenosis. 3. Mild spinal stenosis at L2-3. The patient was admitted for pain management and PT evaluation to help direct disposition. Neurosurgery was consulted.   Pertinent labs include the following: CBC - RBC 4.28, BMP Glu 194 BUN 25   Assessment & Plan:   Principal Problem:   Low back pain Active Problems:   Dyslipidemia   CAD (coronary artery disease), native coronary artery   Chronic left lumbar radiculopathy   DDD (degenerative disc disease), lumbar   Spinal stenosis of lumbar region   Constipation due to pain medication therapy  Acute Lumber Back Pain with Radiculopathy: -MR lumbar spine - post-op changes. DDD L3-L4 and L4-L5 moderate lateral recess stenosis more notable at L3-L4; potential L4 nerve root impingement; mild multilevel neural foraminal stenosis; mild spinal stenosis at L2-L3. -Continue Neurontin, Medrol, and Morphine for pain management  -Start Ibuprofen 400 mg TID for inflammation -Start protonix 40 mg daily for GI prophylaxis -Over-the-phone consulted with Neurosurgery Dr. Newell CoralNudelman who looked at the patient's MRI and agreed with prior neurosurgery  recommendations to utilize spinal pain management group options as well as physical therapy  CAD, dyslipidemia, HTN: -Blood pressure is well-controlled.  -Continue Cozaar, HCTZ, Libitor, and Nitro prn   Opiate-induced Constipation: -Continue Senna, Colace.   DVT prophylaxis: SCDs Code Status: Full Family Communication: The wife was at the bedside during interview. The plan was discussed and all questions were answered. Disposition Plan: Per neurosurgery recommendations, the patient will be evaluated by physical therapy and will be referred to a spinal pain management group. PT eval will help dictate disposition.   Consultants:   Phone consult - Neurosurgery  Procedures:   None  Antimicrobials:   None   Subjective: The patient was sitting upright in hospital bed alert and oriented in no acute distress. The patient reports an improvement in his pain since last night. Last night it was a 9.5/10, today it is 4/10.   Objective: Vitals:   01/31/18 0016 01/31/18 0432 01/31/18 0752  BP: 120/80 132/83 138/83  Pulse: 78 68 71  Resp: 18 18 18   Temp: 98.4 F (36.9 C) 98.4 F (36.9 C) 98.2 F (36.8 C)  TempSrc: Oral Oral   SpO2: 94% 94% 95%    Intake/Output Summary (Last 24 hours) at 01/31/2018 1126 Last data filed at 01/31/2018 0441 Gross per 24 hour  Intake -  Output 700 ml  Net -700 ml   There were no vitals filed for this visit.  Examination:  General exam: Appears calm and comfortable  Respiratory system: Clear to auscultation. Respiratory effort normal. Cardiovascular system: S1 & S2 heard, RRR. No JVD, murmurs, rubs, gallops or clicks. No pedal edema. Gastrointestinal system: Abdomen is nondistended, soft and  nontender. No organomegaly or masses felt. Normal bowel sounds heard. Central nervous system: Alert and oriented. No focal neurological deficits. Extremities: Symmetric 5 x 5 power. Moderate pain following left leg raise from hip radiating to the back of the  knee. Skin: No rashes, lesions or ulcers Psychiatry: Judgement and insight appear normal. Mood & affect appropriate.     Data Reviewed: I have personally reviewed following labs and imaging studies  CBC: Recent Labs  Lab 01/28/18 1305 01/29/18 0334  WBC 7.6 8.7  NEUTROABS 5.7  --   HGB 15.2 14.8  HCT 42.8 41.7  MCV 95.6 97.5  PLT 184 185   Basic Metabolic Panel: Recent Labs  Lab 01/28/18 1305 01/29/18 0334  NA 140 138  K 4.0 4.2  CL 106 106  CO2 27 25  GLUCOSE 127* 194*  BUN 20 25*  CREATININE 0.82 0.95  CALCIUM 9.5 9.1   GFR: Estimated Creatinine Clearance: 86.4 mL/min (by C-G formula based on SCr of 0.95 mg/dL). Liver Function Tests: No results for input(s): AST, ALT, ALKPHOS, BILITOT, PROT, ALBUMIN in the last 168 hours. No results for input(s): LIPASE, AMYLASE in the last 168 hours. No results for input(s): AMMONIA in the last 168 hours. Coagulation Profile: No results for input(s): INR, PROTIME in the last 168 hours. Cardiac Enzymes: No results for input(s): CKTOTAL, CKMB, CKMBINDEX, TROPONINI in the last 168 hours. BNP (last 3 results) No results for input(s): PROBNP in the last 8760 hours. HbA1C: No results for input(s): HGBA1C in the last 72 hours. CBG: No results for input(s): GLUCAP in the last 168 hours. Lipid Profile: No results for input(s): CHOL, HDL, LDLCALC, TRIG, CHOLHDL, LDLDIRECT in the last 72 hours. Thyroid Function Tests: No results for input(s): TSH, T4TOTAL, FREET4, T3FREE, THYROIDAB in the last 72 hours. Anemia Panel: No results for input(s): VITAMINB12, FOLATE, FERRITIN, TIBC, IRON, RETICCTPCT in the last 72 hours. Urine analysis: No results found for: COLORURINE, APPEARANCEUR, LABSPEC, PHURINE, GLUCOSEU, HGBUR, BILIRUBINUR, KETONESUR, PROTEINUR, UROBILINOGEN, NITRITE, LEUKOCYTESUR Sepsis Labs: @LABRCNTIP (procalcitonin:4,lacticidven:4)  )No results found for this or any previous visit (from the past 240 hour(s)).        Radiology Studies: No results found.      Scheduled Meds: . atorvastatin  40 mg Oral Daily  . gabapentin  100 mg Oral TID  . hydrochlorothiazide  12.5 mg Oral Daily  . losartan  50 mg Oral Daily  . methylPREDNISolone  4 mg Oral 3 x daily with food  . [START ON 02/01/2018] methylPREDNISolone  4 mg Oral 4X daily taper  . methylPREDNISolone  8 mg Oral Nightly  . senna-docusate  2 tablet Oral BID   Continuous Infusions:   LOS: 1 day    Nedra Hai, PA-S Mauricio Arrien [facility] Pager [phone]  If 7PM-7AM, please contact night-coverage [email or web address] Password [email or web address] 01/31/2018, 11:26 AM

## 2018-01-31 NOTE — Progress Notes (Signed)
PROGRESS NOTE    Jesse Hodges  [id number] DOB: 01/21/1951 DOA: 01/30/2018 PCP: Clayborn Heronankins, Victoria R, MD    Brief Narrative:  67 year old male who presented with intractable back pain.  He does have significant past medical history for coronary artery disease, thickened spine degenerative disc disease, L3/L4 and L4/L5 with radiculopathy, and spinal stenosis.  Patient [facility] due to respiratory back pain.  Apparently patient had more than routine physical activity at home 24 hours later he developed severe back pain to the point where he was not able to ambulate.  He was [facility] management.  Patient received analgesics, further work-up with MRI, show persistent significant spine disease.  Due to persistent symptoms he was transferred [facility] for further evaluation.   Assessment & Plan:   Principal Problem:   Low back pain Active Problems:   Dyslipidemia   CAD (coronary artery disease), native coronary artery   Chronic left lumbar radiculopathy   DDD (degenerative disc disease), lumbar   Spinal stenosis of lumbar region   Constipation due to pain medication therapy   1. Acute on chronic lumbar back pain with significant radiculopathy.  Continue pain control with Flexeril, gabapentin, hydrocodone, hydromorphone and will add ibuprofen, continue systemic steroids.  Physical therapy evaluation.  Over the phone consultation with this plan surgery, recommendations for medical therapy. Will add pantoprazole for gi prophylaxis.   2.  Dyslipidemia.  Continue atorvastatin.  3.  Hypertension.  Hydrochlorothiazide   DVT prophylaxis: heparin   Code Status: full Family Communication: I spoke with patient's family at the bedside and all questions were addressed.  Disposition Plan/ discharge barriers: pending clinical improvement.    Consultants:   neurosurgery over the phone  Procedures:      Antimicrobials:       Subjective: Patient's pain is control while supine, but get exacerbated with movement, up to 10/10 in intensity, with no radiation, no nausea or vomiting.   Objective: Vitals:   01/31/18 0016 01/31/18 0432 01/31/18 0752 01/31/18 1209  BP: 120/80 132/83 138/83 (!) 144/100  Pulse: 78 68 71 88  Resp: 18 18 18 19   Temp: 98.4 F (36.9 C) 98.4 F (36.9 C) 98.2 F (36.8 C) 98.9 F (37.2 C)  TempSrc: Oral Oral  Oral  SpO2: 94% 94% 95% 95%    Intake/Output Summary (Last 24 hours) at 01/31/2018 1648 Last data filed at 01/31/2018 0441 Gross per 24 hour  Intake -  Output 700 ml  Net -700 ml   There were no vitals filed for this visit.  Examination:   General: Not in pain or dyspnea, deconditioned  Neurology: Awake and alert, non focal  E ENT: no pallor, no icterus, oral mucosa moist Cardiovascular: No JVD. S1-S2 present, rhythmic, no gallops, rubs, or murmurs. No lower extremity edema. Pulmonary: vesicular breath sounds bilaterally, adequate air movement, no wheezing, rhonchi or rales. Gastrointestinal. Abdomen with no organomegaly, non tender, no rebound or guarding Skin. No rashes Musculoskeletal: no joint deformities/ negative leg rising test.      Data Reviewed: I have personally reviewed following labs and imaging studies  CBC: Recent Labs  Lab 01/28/18 1305 01/29/18 0334  WBC 7.6 8.7  NEUTROABS 5.7  --   HGB 15.2 14.8  HCT 42.8 41.7  MCV 95.6 97.5  PLT 184 185   Basic Metabolic Panel: Recent Labs  Lab 01/28/18 1305 01/29/18 0334  NA 140 138  K 4.0 4.2  CL 106 106  CO2 27 25  GLUCOSE 127* 194*  BUN 20 25*  CREATININE 0.82 0.95  CALCIUM 9.5 9.1   GFR: Estimated Creatinine Clearance: 86.4 mL/min (by C-G formula based on SCr of 0.95 mg/dL). Liver Function Tests: No results for input(s): AST, ALT, ALKPHOS, BILITOT, PROT, ALBUMIN in the last 168 hours. No results for input(s): LIPASE, AMYLASE in the last 168 hours. No  results for input(s): AMMONIA in the last 168 hours. Coagulation Profile: No results for input(s): INR, PROTIME in the last 168 hours. Cardiac Enzymes: No results for input(s): CKTOTAL, CKMB, CKMBINDEX, TROPONINI in the last 168 hours. BNP (last 3 results) No results for input(s): PROBNP in the last 8760 hours. HbA1C: No results for input(s): HGBA1C in the last 72 hours. CBG: No results for input(s): GLUCAP in the last 168 hours. Lipid Profile: No results for input(s): CHOL, HDL, LDLCALC, TRIG, CHOLHDL, LDLDIRECT in the last 72 hours. Thyroid Function Tests: No results for input(s): TSH, T4TOTAL, FREET4, T3FREE, THYROIDAB in the last 72 hours. Anemia Panel: No results for input(s): VITAMINB12, FOLATE, FERRITIN, TIBC, IRON, RETICCTPCT in the last 72 hours.    Radiology Studies: I have reviewed all of the imaging during this hospital visit personally     Scheduled Meds: . atorvastatin  40 mg Oral Daily  . gabapentin  100 mg Oral TID  . hydrochlorothiazide  12.5 mg Oral Daily  . ibuprofen  400 mg Oral TID  . losartan  50 mg Oral Daily  . methylPREDNISolone  4 mg Oral 3 x daily with food  . [START ON 02/01/2018] methylPREDNISolone  4 mg Oral 4X daily taper  . methylPREDNISolone  8 mg Oral Nightly  . pantoprazole  40 mg Oral Daily  . senna-docusate  2 tablet Oral BID   Continuous Infusions:   LOS: 1 day        Mauricio Annett Gula, MD [facility] Pager [phone]

## 2018-02-01 NOTE — Evaluation (Signed)
Physical Therapy Evaluation Patient Details Name: Jesse Hodges MRN: [id number] DOB: 08/18/1950 Today's Date: 02/01/2018   History of Present Illness  66 year old male who presented with intractable back pain.  He does have significant past medical history for coronary artery disease, thickened spine degenerative disc disease, L3/L4 and L4/L5 with radiculopathy, and spinal stenosis.  Patient admitted to [facility] due to respiratory back pain.  Apparently patient had more than routine physical activity at home 24 hours later he developed severe back pain to the point where he was not able to ambulate.  He was admitted to [facility] for further management.  Clinical Impression  Pt limited by pain throughout eval, not able to perform mobility functionally at this time. Pt will benefit from continued skilled PT services to address deficits in strength and mobility and improve functional independence. Pt unable to go home alone at this time and PT recommends 24 hour assistance due to pain and needing physical assistance with mobility    Follow Up Recommendations SNF;Supervision/Assistance - 24 hour    Equipment Recommendations  Rolling walker with 5" wheels;Wheelchair cushion (measurements PT);Wheelchair (measurements PT)(18 x 18 w/c if pain is not resolved)    Recommendations for Other Services       Precautions / Restrictions Precautions Precautions: Back;Fall Restrictions Weight Bearing Restrictions: No      Mobility  Bed Mobility Overal bed mobility: Needs Assistance Bed Mobility: Rolling;Supine to Sit Rolling: Min assist   Supine to sit: +2 for physical assistance     General bed mobility comments: pt requires very increased time, use of bedrail and min A for LEs with rolling, supine to sit requires max increased time and assist for LEs and trunk  Transfers                 General transfer comment: unable to attempt secondary to  pain  Ambulation/Gait                Stairs            Wheelchair Mobility    Modified Rankin (Stroke Patients Only)       Balance Overall balance assessment: Needs assistance Sitting-balance support: Bilateral upper extremity supported Sitting balance-Leahy Scale: Poor Sitting balance - Comments: pt requires min A for sitting edge of bed x 4 minutes, limited by pain                                     Pertinent Vitals/Pain Pain Assessment: 0-10 Pain Score: 9  Pain Location: back Pain Descriptors / Indicators: Aching;Burning;Cramping Pain Intervention(s): Monitored during session;Limited activity within patient's tolerance;Premedicated before session;Repositioned    Home Living Family/patient expects to be discharged to:: Private residence Living Arrangements: Spouse/significant other Available Help at Discharge: Family;Available PRN/intermittently Type of Home: House Home Access: Stairs to enter Entrance Stairs-Rails: Right Entrance Stairs-Number of Steps: 4 Home Layout: Multi-level Home Equipment: None      Prior Function Level of Independence: Independent         Comments: Pt needs to take regular breaks, etc secondary to chronic back issues but can be somewhat active     Hand Dominance        Extremity/Trunk Assessment   Upper Extremity Assessment Upper Extremity Assessment: Overall WFL for tasks assessed(limited by pain)    Lower Extremity Assessment Lower Extremity Assessment: Generalized weakness(limited by pain)    Cervical / Trunk Assessment  Cervical / Trunk Assessment: (history of lumbar surgery)  Communication   Communication: No difficulties  Cognition Arousal/Alertness: Awake/alert Behavior During Therapy: WFL for tasks assessed/performed Overall Cognitive Status: Within Functional Limits for tasks assessed                                        General Comments      Exercises      Assessment/Plan    PT Assessment Patient needs continued PT services  PT Problem List Decreased strength;Decreased range of motion;Decreased activity tolerance;Decreased mobility;Decreased balance;Decreased safety awareness;Pain;Decreased knowledge of use of DME       PT Treatment Interventions DME instruction;Gait training;Stair training;Functional mobility training;Therapeutic activities;Therapeutic exercise;Balance training;Neuromuscular re-education;Patient/family education;Wheelchair mobility training;Manual techniques;Modalities    PT Goals (Current goals can be found in the Care Plan section)  Acute Rehab PT Goals Patient Stated Goal: control the pain  PT Goal Formulation: With patient Time For Goal Achievement: 02/15/18 Potential to Achieve Goals: Fair    Frequency Min 5X/week   Barriers to discharge Inaccessible home environment;Decreased caregiver support has stairs to enter home and spouse works    Co-evaluation               AM-PAC PT "6 Clicks" Daily Activity  Outcome Measure Difficulty turning over in bed (including adjusting bedclothes, sheets and blankets)?: A Lot Difficulty moving from lying on back to sitting on the side of the bed? : A Lot Difficulty sitting down on and standing up from a chair with arms (e.g., wheelchair, bedside commode, etc,.)?: Unable Help needed moving to and from a bed to chair (including a wheelchair)?: Total Help needed walking in hospital room?: Total Help needed climbing 3-5 steps with a railing? : Total 6 Click Score: 8    End of Session   Activity Tolerance: Patient limited by pain Patient left: in bed;with bed alarm set;with call bell/phone within reach;with family/visitor present Nurse Communication: Mobility status PT Visit Diagnosis: Muscle weakness (generalized) (M62.81);Difficulty in walking, not elsewhere classified (R26.2);Pain Pain - Right/Left: Left Pain - part of body: Hip;Leg;Knee(back pain)    Time:  1110-1140 PT Time Calculation (min) (ACUTE ONLY): 30 min   Charges:   PT Evaluation $PT Eval Moderate Complexity: 1 Mod PT Treatments $Therapeutic Activity: 8-22 mins       Reggy EyeKaren Zyasia Halbleib, PT, DPT  Aubrey Blackard 02/01/2018, 11:56 AM

## 2018-02-01 NOTE — Care Management Important Message (Signed)
Important Message  Patient Details  Name: Jesse Hodges MRN: [id number] Date of Birth: 06/19/1951   Medicare Important Message Given:  Yes    [name] 02/01/2018, 2:17 PM

## 2018-02-01 NOTE — Progress Notes (Signed)
PROGRESS NOTE    Jesse Hodges  XBJ:[id number] DOB: Jul 14, 1950 DOA: 01/30/2018 PCP: Clayborn Heron, MD    Brief Narrative:  67 year old male who presented with intractable back pain.  He does have significant past medical history for coronary artery disease, thickened spine degenerative disc disease, L3/L4 and L4/L5 with radiculopathy, and spinal stenosis.  Patient admitted to [facility] due to respiratory back pain.  Apparently patient had more than routine physical activity at home 24 hours later he developed severe back pain to the point where he was not able to ambulate.  He was admitted to [facility] hospital for further management.  Patient received analgesics, further work-up with MRI, show persistent significant spine disease.  Due to persistent symptoms he was transferred to [facility] for further evaluation.   Assessment & Plan:   Principal Problem:   Low back pain Active Problems:   Dyslipidemia   CAD (coronary artery disease), native coronary artery   Chronic left lumbar radiculopathy   DDD (degenerative disc disease), lumbar   Spinal stenosis of lumbar region   Constipation due to pain medication therapy   1. Acute on chronic lumbar back pain with significant radiculopathy.  Pain with mild improvement, not back to baseline, will continue Flexeril, gabapentin, hydrocodone, hydromorphone, scheduled ibuprofen, systemic steroids and lidocaine patch. Continue physical therapy. If now improvement over next 24 to 48 hrs will need SNF. Continue GI prophylaxis with pantoprazole.   2.  Dyslipidemia. On atorvastatin.  3.  Hypertension.  Continue Hydrochlorothiazide and losartan for blood pressure control, systolic blood pressure 111 mmHg.    DVT prophylaxis: heparin   Code Status: full Family Communication: I spoke with patient's family at the bedside and all questions were addressed.  Disposition Plan/ discharge barriers: pending clinical  improvement.    Consultants:   neurosurgery over the phone  Procedures:     Antimicrobials:    Subjective: Patient continue to have back pain, moderate to severe, mild improvement with analgesics, worse with movement and associated with ambulatory dysfunction, no nausea or vomiting.   Objective: Vitals:   01/31/18 2316 02/01/18 0417 02/01/18 0735 02/01/18 1159  BP: 113/80 119/75 (!) 141/86 111/74  Pulse: 79 74 68 86  Resp: 20 (!) 0 16 20  Temp: 98.5 F (36.9 C) 98 F (36.7 C) 98.2 F (36.8 C) 98.1 F (36.7 C)  TempSrc: Oral Oral Oral Oral  SpO2: 95% 95% 95% 96%  Weight:      Height:        Intake/Output Summary (Last 24 hours) at 02/01/2018 1311 Last data filed at 02/01/2018 1113 Gross per 24 hour  Intake 630 ml  Output 1750 ml  Net -1120 ml   Filed Weights   01/31/18 2001  Weight: 90.6 kg (199 lb 11.8 oz)    Examination:   General: Not in pain or dyspnea, deconditioned  Neurology: Awake and alert, non focal  E ENT: mild pallor, no icterus, oral mucosa moist Cardiovascular: No JVD. S1-S2 present, rhythmic, no gallops, rubs, or murmurs. No lower extremity edema. Pulmonary: decreased breath sounds bilaterally, adequate air movement, no wheezing, rhonchi or rales. Gastrointestinal. Abdomen with no organomegaly, non tender, no rebound or guarding Skin. No rashes Musculoskeletal: no joint deformities. No range of motion tested today.      Data Reviewed: I have personally reviewed following labs and imaging studies  CBC: Recent Labs  Lab 01/28/18 1305 01/29/18 0334  WBC 7.6 8.7  NEUTROABS 5.7  --   HGB 15.2 14.8  HCT 42.8 41.7  MCV 95.6 97.5  PLT 184 185   Basic Metabolic Panel: Recent Labs  Lab 01/28/18 1305 01/29/18 0334  NA 140 138  K 4.0 4.2  CL 106 106  CO2 27 25  GLUCOSE 127* 194*  BUN 20 25*  CREATININE 0.82 0.95  CALCIUM 9.5 9.1   GFR: Estimated Creatinine Clearance: 86.4 mL/min (by C-G formula based on SCr of 0.95  mg/dL). Liver Function Tests: No results for input(s): AST, ALT, ALKPHOS, BILITOT, PROT, ALBUMIN in the last 168 hours. No results for input(s): LIPASE, AMYLASE in the last 168 hours. No results for input(s): AMMONIA in the last 168 hours. Coagulation Profile: No results for input(s): INR, PROTIME in the last 168 hours. Cardiac Enzymes: No results for input(s): CKTOTAL, CKMB, CKMBINDEX, TROPONINI in the last 168 hours. BNP (last 3 results) No results for input(s): PROBNP in the last 8760 hours. HbA1C: No results for input(s): HGBA1C in the last 72 hours. CBG: No results for input(s): GLUCAP in the last 168 hours. Lipid Profile: No results for input(s): CHOL, HDL, LDLCALC, TRIG, CHOLHDL, LDLDIRECT in the last 72 hours. Thyroid Function Tests: No results for input(s): TSH, T4TOTAL, FREET4, T3FREE, THYROIDAB in the last 72 hours. Anemia Panel: No results for input(s): VITAMINB12, FOLATE, FERRITIN, TIBC, IRON, RETICCTPCT in the last 72 hours.    Radiology Studies: I have reviewed all of the imaging during this hospital visit personally     Scheduled Meds: . atorvastatin  40 mg Oral Daily  . gabapentin  100 mg Oral TID  . hydrochlorothiazide  12.5 mg Oral Daily  . ibuprofen  400 mg Oral TID  . lidocaine  1 patch Transdermal Q24H  . losartan  50 mg Oral Daily  . methylPREDNISolone  4 mg Oral 4X daily taper  . pantoprazole  40 mg Oral Daily  . senna-docusate  2 tablet Oral BID   Continuous Infusions:   LOS: 2 days        Jesse Hodges Annett Gulaaniel Chrles Selley, MD [facility] Pager [phone]

## 2018-02-02 ENCOUNTER — Encounter (HOSPITAL_COMMUNITY): Payer: Self-pay

## 2018-02-02 MED ORDER — HYDROCODONE-ACETAMINOPHEN 10-325 MG PO TABS
1.0000 | ORAL_TABLET | ORAL | Status: DC | PRN
  Administered 2018-02-02 – 2018-02-04 (×2): 1 via ORAL
  Filled 2018-02-02 (×2): qty 1

## 2018-02-02 NOTE — Plan of Care (Signed)
  Problem: Coping: Goal: Level of anxiety will decrease Outcome: Progressing   Problem: Elimination: Goal: Will not experience complications related to bowel motility Outcome: Progressing   Problem: Pain Managment: Goal: General experience of comfort will improve Outcome: Progressing   Problem: Skin Integrity: Goal: Risk for impaired skin integrity will decrease Outcome: Progressing   

## 2018-02-02 NOTE — Evaluation (Signed)
Occupational Therapy Evaluation Patient Details Name: Jesse Hodges M [place] MRN: [id number] DOB: 03/31/1951 Today's Date: 02/02/2018    History of Present Illness 67 year old male who presented with intractable back pain.  He does have significant past medical history for coronary artery disease, thickened spine degenerative disc disease, L3/L4 and L4/L5 with radiculopathy, and spinal stenosis.  Patient admitted to [facility] due to respiratory back pain.  Apparently patient had more than routine physical activity at home 24 hours later he developed severe back pain to the point where he was not able to ambulate.  He was [facility] for [facility] management.   Clinical Impression  Pt admitted with back pain. Pt currently with functional limitations due to the deficits listed below (see OT Problem List).  Pt will benefit from skilled OT to increase their safety and independence with ADL and functional mobility for ADL to facilitate discharge to venue listed below.      Follow Up Recommendations  SNF    Equipment Recommendations  None recommended by OT    Recommendations for Other Services       Precautions / Restrictions Precautions Precautions: Back;Fall Restrictions Weight Bearing Restrictions: No      Mobility Bed Mobility Overal bed mobility: Needs Assistance Bed Mobility: Rolling(only would roll left) Rolling: Min assist         General bed mobility comments: Upon pt reaching side lying upon legs beginning to come off bed pt said he couldnt do anymore which was at the exact same time his wife walked in the room.Adam [place]. Encoruaged pt to continue mobility and pt stated-  sure I will do it and then hurt for 5 hours.  OT explained this was not the intention but need to be able to see how he was doing with mobility. Encouraged pt to roll Right and he refused  Transfers                 General transfer comment: did not perform due to pain.  Feel  pt is self limiting to an extent        ADL either performed or assessed with clinical judgement   ADL                              refused  Pt only rolled in bed to right and refused further activity                  Vision Patient Visual Report: No change from baseline              Pertinent Vitals/Pain Pain Score: 5  Pain Location: back Pain Descriptors / Indicators: Discomfort;Aching;Sore Pain Intervention(s): Limited activity within patient's tolerance;Repositioned;Patient requesting pain meds-RN notified     Hand Dominance     Extremity/Trunk Assessment Upper Extremity Assessment Upper Extremity Assessment: Overall WFL for tasks assessed           Communication Communication Communication: No difficulties   Cognition Arousal/Alertness: Awake/alert Behavior During Therapy: WFL for tasks assessed/performed Overall Cognitive Status: Within Functional Limits for tasks assessed                                                Home Living Family/patient expects to be discharged to:: Skilled nursing facility Living Arrangements: Spouse/significant other Available  Help at Discharge: Family;Available PRN/intermittently Type of Home: House Home Access: Stairs to enter [employer] of Steps: 4 Entrance Stairs-Rails: Right Home Layout: Multi-level Alternate Level Stairs-Number of Steps: 2 Alternate Level Stairs-Rails: None           Home Equipment: None          Prior Functioning/Environment Level of Independence: Independent        Comments: Pt needs to take regular breaks, etc secondary to chronic back issues but can be somewhat active        OT Problem List: Decreased strength;Decreased activity tolerance;Pain;Decreased safety awareness;Decreased knowledge of precautions;Decreased knowledge of use of DME or AE      OT Treatment/Interventions: Self-care/ADL training;Patient/family education;DME  and/or AE instruction    OT Goals(Current goals can be found in the care plan section) Acute Rehab OT Goals Patient Stated Goal: control the pain  OT Goal Formulation: With patient Time For Goal Achievement: 02/16/18 Potential to Achieve Goals: Good  OT Frequency: Min 2X/week   Barriers to D/C:            Co-evaluation              AM-PAC PT "6 Clicks" Daily Activity     Outcome Measure Help from another person eating meals?: A Little Help from another person taking care of personal grooming?: A Little Help from another person toileting, which includes using toliet, bedpan, or urinal?: Total Help from another person bathing (including washing, rinsing, drying)?: A Lot Help from another person to put on and taking off regular upper body clothing?: A Lot Help from another person to put on and taking off regular lower body clothing?: Total 6 Click Score: 12   End of Session Nurse Communication: Mobility status;Other (comment)(Disccused OT session with RN)  Activity Tolerance: Patient limited by pain;Other (comment)(self limiting) Patient left: in bed;with call bell/phone within reach;with family/visitor present  OT Visit Diagnosis: Unsteadiness on feet (R26.81);Muscle weakness (generalized) (M62.81);Other abnormalities of gait and mobility (R26.89);Pain Pain - Right/Left: (back)                Time: 2956-2130 OT Time Calculation (min): 31 min Charges:  OT General Charges $OT Visit: 1 Visit OT Evaluation $OT Eval Moderate Complexity: 1 Mod OT Treatments $Self Care/Home Management : 8-22 mins  [place], [place] [phone]  Einar Crow D 02/02/2018, 2:56 PM

## 2018-02-02 NOTE — Progress Notes (Addendum)
Jesse Hodges  ZOX:[id number] DOB: 01/24/1951 DOA: 01/30/2018 PCP: Clayborn Heron, MD    Brief Narrative:  67 year old male who presented with intractable back pain. He does have significant past medical history for coronary artery disease, thickened spine degenerative disc disease, L3/L4 and L4/L5 with radiculopathy, andspinal stenosis.Patient admitted to [facility] due to respiratory back pain. Apparently patient had more than routine physical activity at home 24 hours later he developed severe back pain to the point where he was not able to ambulate. He was admitted to [facility] for further management.  Patient received analgesics, further work-up with MRI, show persistent significant spine disease.Due to persistent symptoms he was transferred to [facility] for further evaluation.   Assessment & Plan:   Principal Problem:   Low back pain Active Problems:   Dyslipidemia   CAD (coronary artery disease), native coronary artery   Chronic left lumbar radiculopathy   DDD (degenerative disc disease), lumbar   Spinal stenosis of lumbar region   Constipation due to pain medication therapy  1. Acute on chronic lumbar back pain with significant radiculopathy.Pain has improved, but not back to baseline, will continue regimen with  Flexeril, gabapentin, hydrocodone, hydromorphone,  ibuprofen, systemic steroids and lidocaine patch. Out of bed as tolerated, if clinically improves in the next 24 hours, possible dc home, of not will plan for snf.  2.Dyslipidemia.Contineu atorvastatin.  3.Hypertension. On Hydrochlorothiazide and losartan with adequate blood pressure control  DVT prophylaxis:heparin Code Status:full Family Communication:no family at bedside today.  Disposition Plan/ discharge barriers:pending clinical improvement.   Consultants:  neurosurgery over the  phone  Procedures:    Antimicrobials    Subjective: Pain at the lower back has improved but still not back to baseline, no nausea or vomiting, no chest pain. Continue to have difficulty ambulating.   Objective: Vitals:   02/01/18 2013 02/01/18 2346 02/02/18 0417 02/02/18 0736  BP: 108/75 110/80 119/82 124/89  Pulse: 93 75 71 83  Resp: 18 20 18 18   Temp: 98.4 F (36.9 C) 98.5 F (36.9 C) 97.9 F (36.6 C) 98.3 F (36.8 C)  TempSrc: Oral Oral Oral Oral  SpO2: 97% 95% 98% 98%  Weight:      Height:        Intake/Output Summary (Last 24 hours) at 02/02/2018 1057 Last data filed at 02/02/2018 0800 Gross per 24 hour  Intake 480 ml  Output 1750 ml  Net -1270 ml   Filed Weights   01/31/18 2001  Weight: 90.6 kg (199 lb 11.8 oz)    Examination:   General: Not in pain or dyspnea. Deconditioned  Neurology: Awake and alert, non focal  E ENT: no pallor, no icterus, oral mucosa moist Cardiovascular: No JVD. S1-S2 present, rhythmic, no gallops, rubs, or murmurs. No lower extremity edema. Pulmonary: vesicular breath sounds bilaterally, adequate air movement, no wheezing, rhonchi or rales. Gastrointestinal. Abdomen with no organomegaly, non tender, no rebound or guarding Skin. No rashes Musculoskeletal: no joint deformities     Data Reviewed: I have personally reviewed following labs and imaging studies  CBC: Recent Labs  Lab 01/28/18 1305 01/29/18 0334  WBC 7.6 8.7  NEUTROABS 5.7  --   HGB 15.2 14.8  HCT 42.8 41.7  MCV 95.6 97.5  PLT 184 185   Basic Metabolic Panel: Recent Labs  Lab 01/28/18 1305 01/29/18 0334  NA 140 138  K 4.0 4.2  CL 106 106  CO2 27 25  GLUCOSE 127* 194*  BUN 20 25*  CREATININE 0.82 0.95  CALCIUM 9.5 9.1   GFR: Estimated Creatinine Clearance: 86.4 mL/min (by C-G formula based on SCr of 0.95 mg/dL). Liver Function Tests: No results for input(s): AST, ALT, ALKPHOS, BILITOT, PROT, ALBUMIN in the last 168 hours. No results for  input(s): LIPASE, AMYLASE in the last 168 hours. No results for input(s): AMMONIA in the last 168 hours. Coagulation Profile: No results for input(s): INR, PROTIME in the last 168 hours. Cardiac Enzymes: No results for input(s): CKTOTAL, CKMB, CKMBINDEX, TROPONINI in the last 168 hours. BNP (last 3 results) No results for input(s): PROBNP in the last 8760 hours. HbA1C: No results for input(s): HGBA1C in the last 72 hours. CBG: No results for input(s): GLUCAP in the last 168 hours. Lipid Profile: No results for input(s): CHOL, HDL, LDLCALC, TRIG, CHOLHDL, LDLDIRECT in the last 72 hours. Thyroid Function Tests: No results for input(s): TSH, T4TOTAL, FREET4, T3FREE, THYROIDAB in the last 72 hours. Anemia Panel: No results for input(s): VITAMINB12, FOLATE, FERRITIN, TIBC, IRON, RETICCTPCT in the last 72 hours.    Radiology Studies: I have reviewed all of the imaging during this hospital visit personally     Scheduled Meds: . atorvastatin  40 mg Oral Daily  . gabapentin  100 mg Oral TID  . hydrochlorothiazide  12.5 mg Oral Daily  . ibuprofen  400 mg Oral TID  . lidocaine  1 patch Transdermal Q24H  . losartan  50 mg Oral Daily  . methylPREDNISolone  4 mg Oral 4X daily taper  . pantoprazole  40 mg Oral Daily  . senna-docusate  2 tablet Oral BID   Continuous Infusions:   LOS: 3 days        Linas Stepter Annett Gulaaniel Kamali Nephew, MD [facility] Pager [phone]

## 2018-02-03 DIAGNOSIS — L8990 Pressure ulcer of unspecified site, unspecified stage: Secondary | ICD-10-CM

## 2018-02-03 NOTE — Progress Notes (Signed)
Patient able to turn self with no assistance, however, he states he will not lay on side due to pain. Patient made aware he has allowed two pressures wounds to form, one Stage I and Stage II on left buttocks. Even with education on importance of shifting weight to reduce risk of wounds he still says he refuses to turn. New wounds documented.

## 2018-02-03 NOTE — Progress Notes (Signed)
PROGRESS NOTE    Jesse Hodges  ZOX:[id number] DOB: Mar 30, 1951 DOA: 01/30/2018 PCP: Clayborn Heron, MD    Brief Narrative:  67 year old male who presented with intractable back pain. He does have significant past medical history for coronary artery disease, thickened spine degenerative disc disease, L3/L4 and L4/L5 with radiculopathy, andspinal stenosis.Patient admitted to [facility] due to respiratory back pain. Apparently patient had more than routine physical activity at home 24 hours later he developed severe back pain to the point where he was not able to ambulate. He was admitted to [facility] for further management.  Patient received analgesics, further work-up with MRI, show persistent significant spine disease.Due to persistent symptoms he was transferred to [facility] for further evaluation.  Assessment & Plan:   Principal Problem:   Low back pain Active Problems:   Dyslipidemia   CAD (coronary artery disease), native coronary artery   Chronic left lumbar radiculopathy   DDD (degenerative disc disease), lumbar   Spinal stenosis of lumbar region   Constipation due to pain medication therapy   1. Acute on chronic lumbar back pain with significant radiculopathy.Continue pain regime with flexeril, gabapentin, hydrocodone, hydromorphone, ibuprofen, systemic steroids and lidocaine patch. Not back to baseline, plan for SNF when bed available. Continue bowel regimen. Check bmp in am. Continue GI prophylaxis.   2.Dyslipidemia.On atorvastatin.  3.Hypertension. Continue Hydrochlorothiazideand losartan, for blood pressure control.  DVT prophylaxis:heparin Code Status:full Family Communication:no family at bedside today.  Disposition Plan/ discharge barriers:pending clinical improvement.   Consultants:  neurosurgery over the phone  Procedures:    Antimicrobials  Subjective: Pain continue to improve but  still not back to baseline, still will need SNF at discharge. Patient agreeing in rehab. Positive constipation, no abdominal pain, no nausea or vomiting.   Objective: Vitals:   02/02/18 1926 02/03/18 0008 02/03/18 0500 02/03/18 0735  BP: 117/90 (!) 132/97 (!) 132/97 (!) (P) 110/98  Pulse: 83 90 80 (P) 77  Resp: 18 18 18  (P) 18  Temp: 98.1 F (36.7 C) 98.1 F (36.7 C) 98.1 F (36.7 C) (P) 98 F (36.7 C)  TempSrc: Oral Oral Oral (P) Oral  SpO2: 98% 98% 97% (P) 99%  Weight:      Height:        Intake/Output Summary (Last 24 hours) at 02/03/2018 1029 Last data filed at 02/03/2018 0535 Gross per 24 hour  Intake 240 ml  Output 975 ml  Net -735 ml   Filed Weights   01/31/18 2001  Weight: 90.6 kg (199 lb 11.8 oz)    Examination:   General: Not in pain or dyspnea, deconditioned  Neurology: Awake and alert, non focal  E ENT: mild pallor, no icterus, oral mucosa moist Cardiovascular: No JVD. S1-S2 present, rhythmic, no gallops, rubs, or murmurs. No lower extremity edema. Pulmonary: positive breath sounds bilaterally, adequate air movement, no wheezing, rhonchi or rales. Gastrointestinal. Abdomen distended, no organomegaly, non tender, no rebound or guarding Skin. No rashes Musculoskeletal: no joint deformities     Data Reviewed: I have personally reviewed following labs and imaging studies  CBC: Recent Labs  Lab 01/28/18 1305 01/29/18 0334  WBC 7.6 8.7  NEUTROABS 5.7  --   HGB 15.2 14.8  HCT 42.8 41.7  MCV 95.6 97.5  PLT 184 185   Basic Metabolic Panel: Recent Labs  Lab 01/28/18 1305 01/29/18 0334  NA 140 138  K 4.0 4.2  CL 106 106  CO2 27 25  GLUCOSE 127* 194*  BUN  20 25*  CREATININE 0.82 0.95  CALCIUM 9.5 9.1   GFR: Estimated Creatinine Clearance: 86.4 mL/min (by C-G formula based on SCr of 0.95 mg/dL). Liver Function Tests: No results for input(s): AST, ALT, ALKPHOS, BILITOT, PROT, ALBUMIN in the last 168 hours. No results for input(s): LIPASE, AMYLASE  in the last 168 hours. No results for input(s): AMMONIA in the last 168 hours. Coagulation Profile: No results for input(s): INR, PROTIME in the last 168 hours. Cardiac Enzymes: No results for input(s): CKTOTAL, CKMB, CKMBINDEX, TROPONINI in the last 168 hours. BNP (last 3 results) No results for input(s): PROBNP in the last 8760 hours. HbA1C: No results for input(s): HGBA1C in the last 72 hours. CBG: No results for input(s): GLUCAP in the last 168 hours. Lipid Profile: No results for input(s): CHOL, HDL, LDLCALC, TRIG, CHOLHDL, LDLDIRECT in the last 72 hours. Thyroid Function Tests: No results for input(s): TSH, T4TOTAL, FREET4, T3FREE, THYROIDAB in the last 72 hours. Anemia Panel: No results for input(s): VITAMINB12, FOLATE, FERRITIN, TIBC, IRON, RETICCTPCT in the last 72 hours.    Radiology Studies: I have reviewed all of the imaging during this hospital visit personally     Scheduled Meds: . atorvastatin  40 mg Oral Daily  . gabapentin  100 mg Oral TID  . hydrochlorothiazide  12.5 mg Oral Daily  . ibuprofen  400 mg Oral TID  . lidocaine  1 patch Transdermal Q24H  . losartan  50 mg Oral Daily  . methylPREDNISolone  4 mg Oral 4X daily taper  . pantoprazole  40 mg Oral Daily  . senna-docusate  2 tablet Oral BID   Continuous Infusions:   LOS: 4 days        Dyllen Menning Annett Gulaaniel Ahmere Hemenway, MD [facility] Pager [phone]

## 2018-02-03 NOTE — NC FL2 (Signed)
[place] MEDICAID FL2 LEVEL OF CARE SCREENING TOOL     IDENTIFICATION  Patient Name: Jesse Hodges Birthdate: 02/16/1951 Sex: male Admission Date (Current Location): 01/30/2018  [facility] [place]   Facility and Address:  [facility] [place], [address], [place], [place] [address]      Provider Number: [id number]  Attending Physician Name and Address:  Coralie KeensArrien, Mauricio Daniel,*  Relative Name and Phone Number:       Current Level of Care: Hospital Recommended Level of Care: Skilled Nursing Facility Prior Approval Number:    Date Approved/Denied:   PASRR Number:    Discharge Plan: SNF    Current Diagnoses: Patient Active Problem List   Diagnosis Date Noted  . Spinal column pain 01/30/2018  . Chronic left lumbar radiculopathy 01/30/2018  . DDD (degenerative disc disease), lumbar 01/30/2018  . Spinal stenosis of lumbar region 01/30/2018  . Constipation due to pain medication therapy 01/30/2018  . Low back pain 01/28/2018  . CAD (coronary artery disease), native coronary artery 07/17/2014  . 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 Angina effort 07/16/2014  . Abnormal cardiovascular function study 07/16/2014  . Dyslipidemia 07/16/2014    Orientation RESPIRATION BLADDER Height & Weight     Self, Time, Situation, Place  Normal Continent Weight: 199 lb 11.8 oz (90.6 kg) Height:  6\' 1"  (185.4 cm)  BEHAVIORAL SYMPTOMS/MOOD NEUROLOGICAL BOWEL NUTRITION STATUS      Continent Diet(Heart healthy, thin liquids)  AMBULATORY STATUS COMMUNICATION OF NEEDS Skin   Limited Assist Verbally Normal                       Personal Care Assistance Level of Assistance  Bathing, Feeding, Dressing Bathing Assistance: Limited assistance Feeding assistance: Independent Dressing Assistance: Limited assistance     Functional Limitations Info  Sight,  Hearing, Speech Sight Info: Adequate Hearing Info: Adequate Speech Info: Adequate    SPECIAL CARE FACTORS FREQUENCY  OT (By licensed OT), PT (By licensed PT)     PT Frequency: 5x OT Frequency: 5x            Contractures Contractures Info: Not present    Additional Factors Info  Code Status, Allergies Code Status Info: Full Code Allergies Info: NO known allergies           Current Medications (02/03/2018):  This is the current hospital active medication list Current Facility-Administered Medications  Medication Dose Route Frequency Provider Last Rate Last Dose  . atorvastatin (LIPITOR) tablet 40 mg  40 mg Oral Daily Elyse HsuLambeth, Sally M, MD   40 mg at 02/03/18 1119  . cyclobenzaprine (FLEXERIL) tablet 5 mg  5 mg Oral TID PRN Elyse HsuLambeth, Sally M, MD   5 mg at 02/01/18 1119  . gabapentin (NEURONTIN) capsule 100 mg  100 mg Oral TID Elyse HsuLambeth, Sally M, MD   100 mg at 02/03/18 1119  . hydrochlorothiazide (MICROZIDE) capsule 12.5 mg  12.5 mg Oral Daily Elyse HsuLambeth, Sally M, MD   12.5 mg at 02/03/18 1120  . HYDROcodone-acetaminophen (NORCO) 10-325 MG per tablet 1 tablet  1 tablet Oral Q4H PRN Arrien, York RamMauricio Daniel, MD   1 tablet at 02/02/18 1758  . HYDROmorphone (DILAUDID) injection 1 mg  1 mg Intravenous Q4H PRN Elyse HsuLambeth, Sally M, MD   1 mg at 02/01/18 1119  . ibuprofen (ADVIL,MOTRIN) tablet 400 mg  400 mg Oral TID Arrien,  York Ram, MD   400 mg at 02/03/18 1119  . lidocaine (LIDODERM) 5 % 1 patch  1 patch Transdermal Q24H Coralie Keens, MD   1 patch at 02/02/18 1644  . losartan (COZAAR) tablet 50 mg  50 mg Oral Daily Elyse Hsu, MD   50 mg at 02/03/18 1119  . methylPREDNISolone (MEDROL DOSEPAK) tablet 4 mg  4 mg Oral 4X daily taper Elyse Hsu, MD   4 mg at 02/03/18 1120  . nitroGLYCERIN (NITROSTAT) SL tablet 0.4 mg  0.4 mg Sublingual Q5 min PRN Elyse Hsu, MD      . pantoprazole (PROTONIX) EC tablet 40 mg  40 mg Oral Daily Arrien, York Ram, MD   40 mg at  02/03/18 1120  . senna-docusate (Senokot-S) tablet 2 tablet  2 tablet Oral BID Elyse Hsu, MD   2 tablet at 02/03/18 1120     Discharge Medications: Please see discharge summary for a list of discharge medications.  Relevant Imaging Results:  Relevant Lab Results:   Additional Information SSN: [id number]  Maree Krabbe, LCSW

## 2018-02-03 NOTE — Clinical Social Work Note (Signed)
Clinical Social Work Assessment  Patient Details  Name: Jesse Hodges MRN: [id number] Date of Birth: 11/26/1950  Date of referral:  02/03/18               Reason for consult:  Facility Placement                Permission sought to share information with:  Facility Medical sales representativeContact Representative Permission granted to share information::     Name::     Jesse Hodges  Agency::  [facility]  Relationship::  Spouse  Contact Information:  [phone]  Housing/Transportation Living arrangements for the past 2 months:  Skilled Nursing Facility Source of Information:  Patient Patient Interpreter Needed:  None Criminal Activity/Legal Involvement Pertinent to Current Situation/Hospitalization:  No - Comment as needed Significant Relationships:  Spouse Lives with:  Spouse Do you feel safe going back to the place where you live?  Yes Need for family participation in patient care:  No (Coment)  Care giving concerns:  Pt is alert and oriented.   Social Worker assessment / plan:  CSW spoke with pt at bedside. [facility]. CSW to reach out to facility and follow up.  Employment status:  Retired Health and safety inspectornsurance information:  Medicare PT Recommendations:  Skilled Nursing Facility Information / Referral to community resources:  Skilled Nursing Facility  Patient/Family's Response to care:  Pt verbalized understanding of CSW role and expressed appreciation for support. Pt denies any concern regarding pt care at this time.   Patient/Family's Understanding of and Emotional Response to Diagnosis, Current Treatment, and Prognosis:  Pt understanding and realistic regarding physical limitations. Pt understands the need for SNF placement at d/c. Pt agreeable to SNF placement at d/c, at this time. Pt's responses emotionally appropriate during conversation with CSW. Pt denies any concern regarding treatment plan at this time. CSW will continue to provide support and  facilitate d/c needs.   Emotional Assessment Appearance:  Appears stated age Attitude/Demeanor/Rapport:  (Patient was appropriate) Affect (typically observed):  Agitated, Appropriate, Guarded Orientation:  Oriented to Self, Oriented to Place, Oriented to  Time, Oriented to Situation Alcohol / Substance use:  Not Applicable Psych involvement (Current and /or in the community):  No (Comment)  Discharge Needs  Concerns to be addressed:  Basic Needs, Care Coordination Readmission within the last 30 days:  Yes Current discharge risk:  Dependent with Mobility Barriers to Discharge:  Continued Medical Work up   Pacific MutualBridget A Tabitha Tupper, LCSW 02/03/2018, 11:25 AM

## 2018-02-03 NOTE — Clinical Social Work Note (Signed)
CSW spoke with pt's spouse. Pt and pt's spouse now would like to be [facility] to [facility] facilities in order to be sure that if pt needs hospitalization he would [facility] as well as working with [place] [facility]. CSW will provide pt's spouse with list of bed offers once avaliable.   Velora MediateBridget Gabriana Wilmott, MSW [phone]

## 2018-02-03 NOTE — Plan of Care (Signed)
  Problem: Education: Goal: Knowledge of General Education information will improve Description Including pain rating scale, medication(s)/side effects and non-pharmacologic comfort measures Outcome: Progressing   Problem: Clinical Measurements: Goal: Ability to maintain clinical measurements within normal limits will improve Outcome: Progressing Goal: Diagnostic test results will improve Outcome: Progressing   Problem: Nutrition: Goal: Adequate nutrition will be maintained Outcome: Progressing   Problem: Coping: Goal: Level of anxiety will decrease Outcome: Progressing   Problem: Elimination: Goal: Will not experience complications related to bowel motility Outcome: Progressing

## 2018-02-04 LAB — BASIC METABOLIC PANEL
ANION GAP: 9 (ref 5–15)
BUN: 31 mg/dL — AB (ref 8–23)
CHLORIDE: 98 mmol/L (ref 98–111)
CO2: 28 mmol/L (ref 22–32)
Calcium: 9 mg/dL (ref 8.9–10.3)
Creatinine, Ser: 0.97 mg/dL (ref 0.61–1.24)
GFR calc Af Amer: >60 mL/min (ref >60)
Glucose, Bld: 130 mg/dL — ABNORMAL HIGH (ref 70–99)
POTASSIUM: 3.7 mmol/L (ref 3.5–5.1)
SODIUM: 135 mmol/L (ref 135–145)

## 2018-02-04 MED ORDER — CYCLOBENZAPRINE HCL 10 MG PO TABS
5.0000 mg | ORAL_TABLET | Freq: Three times a day (TID) | ORAL | Status: DC
  Administered 2018-02-04 – 2018-02-06 (×6): 5 mg via ORAL
  Filled 2018-02-04 (×6): qty 1

## 2018-02-04 MED ORDER — ACETAMINOPHEN 325 MG PO TABS
650.0000 mg | ORAL_TABLET | Freq: Four times a day (QID) | ORAL | Status: DC
  Administered 2018-02-04 – 2018-02-06 (×7): 650 mg via ORAL
  Filled 2018-02-04 (×8): qty 2

## 2018-02-04 NOTE — Progress Notes (Signed)
Physical Therapy Treatment Patient Details Name: Jesse Hodges MRN: [id number] DOB: 20-Jan-1951 Today's Date: 02/04/2018    History of Present Illness 67 year old male who presented with intractable back pain.  He does have significant past medical history for coronary artery disease, thickened spine degenerative disc disease, L3/L4 and L4/L5 with radiculopathy, and spinal stenosis.  Patient admitted to [facility] due to respiratory back pain.  Apparently patient had more than routine physical activity at home 24 hours later he developed severe back pain to the point where he was not able to ambulate.  He was admitted to [facility] for further management.    PT Comments    Patient seen for mobility progression. Pt is able to roll to L side only in bed with min guard and heavy use of rail and increased time. Pt unable to get from side lying to sitting EOB due to report of increased pain during transition and returned to supine in bed. Pt encouraged to attempt increased mobilization. Continue to progress as tolerated with anticipated d/c to SNF for further skilled PT services.     Follow Up Recommendations  SNF;Supervision/Assistance - 24 hour     Equipment Recommendations  Rolling walker with 5" wheels;Wheelchair cushion (measurements PT);Wheelchair (measurements PT)(18 x 18 w/c if pain is not resolved)    Recommendations for Other Services       Precautions / Restrictions Precautions Precautions: Back;Fall    Mobility  Bed Mobility Overal bed mobility: Needs Assistance Bed Mobility: Rolling;Sidelying to Sit Rolling: Min guard Sidelying to sit: Min assist(attempted)       General bed mobility comments: pt able to roll onto L side with heavy use of rail and increased time; pt continues to refuse rolling toward R side; therapist assisted with bilat LE to begin transition into sitting from sidelying; pt began feeling increased pain and returned to sidelying  then supine with bilat LE flexed and holding onto L LE  Transfers                    Ambulation/Gait                 Stairs             Wheelchair Mobility    Modified Rankin (Stroke Patients Only)       Balance                                            Cognition Arousal/Alertness: Awake/alert Behavior During Therapy: WFL for tasks assessed/performed Overall Cognitive Status: Within Functional Limits for tasks assessed                                        Exercises      General Comments General comments (skin integrity, edema, etc.): wife present      Pertinent Vitals/Pain Pain Assessment: Faces Faces Pain Scale: Hurts whole lot Pain Location: back Pain Descriptors / Indicators: Grimacing;Guarding Pain Intervention(s): Limited activity within patient's tolerance;Premedicated before session;Repositioned;Monitored during session    Home Living                      Prior Function            PT Goals (current goals can now  be found in the care plan section) Acute Rehab PT Goals Patient Stated Goal: control the pain  PT Goal Formulation: With patient Time For Goal Achievement: 02/15/18 Potential to Achieve Goals: Fair Progress towards PT goals: Not progressing toward goals - comment(pain limiting mobility)    Frequency    Min 5X/week      PT Plan Current plan remains appropriate    Co-evaluation              AM-PAC PT "6 Clicks" Daily Activity  Outcome Measure  Difficulty turning over in bed (including adjusting bedclothes, sheets and blankets)?: A Lot Difficulty moving from lying on back to sitting on the side of the bed? : Unable Difficulty sitting down on and standing up from a chair with arms (e.g., wheelchair, bedside commode, etc,.)?: Unable Help needed moving to and from a bed to chair (including a wheelchair)?: Total Help needed walking in hospital room?: Total Help  needed climbing 3-5 steps with a railing? : Total 6 Click Score: 7    End of Session   Activity Tolerance: Patient limited by pain Patient left: in bed;with bed alarm set;with call bell/phone within reach;with family/visitor present Nurse Communication: Mobility status PT Visit Diagnosis: Muscle weakness (generalized) (M62.81);Difficulty in walking, not elsewhere classified (R26.2);Pain Pain - Right/Left: Left Pain - part of body: Hip;Leg;Knee(back pain)     Time: [id number] PT Time Calculation (min) (ACUTE ONLY): 11 min  Charges:  $Therapeutic Activity: 8-22 mins                     Erline LevineKellyn Davene Jobin, PTA Pager: [phone]     Carolynne EdouardKellyn R Meggin Ola 02/04/2018, 12:06 PM

## 2018-02-04 NOTE — Progress Notes (Signed)
PROGRESS NOTE    Jesse Hodges  LKG:[id number] DOB: 01/16/1951 DOA: 01/30/2018 PCP: Clayborn Heronankins, Victoria R, MD    Brief Narrative:  67 year old male who presented with intractable back pain. He does have significant past medical history for coronary artery disease, thickened spine degenerative disc disease, L3/L4 and L4/L5 with radiculopathy, andspinal stenosis.Patient admitted to [facility] due to respiratory back pain. Apparently patient had more than routine physical activity at home 24 hours later he developed severe back pain to the point where he was not able to ambulate. He was [facility] to [facility] for [facility] management.  Patient received analgesics, further work-up with MRI, show persistent significant spine disease.Due to persistent symptoms he was transferred to [facility] for further evaluation.   Assessment & Plan:   Principal Problem:   Low back pain Active Problems:   Dyslipidemia   CAD (coronary artery disease), native coronary artery   Chronic left lumbar radiculopathy   DDD (degenerative disc disease), lumbar   Spinal stenosis of lumbar region   Constipation due to pain medication therapy   Pressure injury of skin   1. Acute on chronic lumbar back pain with significant radiculopathy. Pain regimen with scheduled flexeril, gabapentin,and ibuprofen. Continue systemic steroids and lidocaine patch. Will change acetaminophen to scheduled dosing. Continue as needed hydromorphone and hydrocodone. DVT and GI prophylaxis. Patient's family is requesting second opinion with Dr Venetia MaxonStern from Neurosurgery, who will see patient in am. Patient will need SNF at discharge.   2.Dyslipidemia.Continue atorvastatin.  3.Hypertension. Blood pressure control with Hydrochlorothiazideand losartan, systolic 118 to 134 systolic.  DVT prophylaxis:heparin Code Status:full Family Communication:no family at bedside today. Disposition Plan/  discharge barriers:will need SNF placement.    Consultants:  neurosurgery over the phone  Procedures:    Antimicrobials    Subjective: Patient continue to have pain with minimal movements, severe in intensity 10/10, sharp, with no radiation, no nausea or vomiting associated, improved immobility and analgesics,   Objective: Vitals:   02/03/18 2109 02/04/18 0040 02/04/18 0319 02/04/18 0801  BP: (!) 136/95 132/81 133/82 134/84  Pulse: 87 82 86 84  Resp: 18 18 19 18   Temp: 98.3 F (36.8 C) 98.2 F (36.8 C) 98 F (36.7 C) 98.3 F (36.8 C)  TempSrc: Oral Oral Oral Oral  SpO2: 98% 97% 95% 96%  Weight:      Height:        Intake/Output Summary (Last 24 hours) at 02/04/2018 1056 Last data filed at 02/04/2018 0645 Gross per 24 hour  Intake 240 ml  Output 701 ml  Net -461 ml   Filed Weights   01/31/18 2001  Weight: 90.6 kg (199 lb 11.8 oz)    Examination:   General: Not in pain or dyspnea, deconditioned and in pain.  Neurology: Awake and alert, non focal  E ENT: mild pallor, no icterus, oral mucosa moist Cardiovascular: No JVD. S1-S2 present, rhythmic, no gallops, rubs, or murmurs. No lower extremity edema. Pulmonary: vesicular breath sounds bilaterally, adequate air movement, no wheezing, rhonchi or rales. Gastrointestinal. Abdomen with no organomegaly, non tender, no rebound or guarding Skin. No rashes Musculoskeletal: no joint deformities     Data Reviewed: I have personally reviewed following labs and imaging studies  CBC: Recent Labs  Lab 01/28/18 1305 01/29/18 0334  WBC 7.6 8.7  NEUTROABS 5.7  --   HGB 15.2 14.8  HCT 42.8 41.7  MCV 95.6 97.5  PLT 184 185   Basic Metabolic Panel: Recent Labs  Lab 01/28/18 1305  01/29/18 0334 02/04/18 0356  NA 140 138 135  K 4.0 4.2 3.7  CL 106 106 98  CO2 27 25 28   GLUCOSE 127* 194* 130*  BUN 20 25* 31*  CREATININE 0.82 0.95 0.97  CALCIUM 9.5 9.1 9.0   GFR: Estimated Creatinine Clearance: 84.7  mL/min (by C-G formula based on SCr of 0.97 mg/dL). Liver Function Tests: No results for input(s): AST, ALT, ALKPHOS, BILITOT, PROT, ALBUMIN in the last 168 hours. No results for input(s): LIPASE, AMYLASE in the last 168 hours. No results for input(s): AMMONIA in the last 168 hours. Coagulation Profile: No results for input(s): INR, PROTIME in the last 168 hours. Cardiac Enzymes: No results for input(s): CKTOTAL, CKMB, CKMBINDEX, TROPONINI in the last 168 hours. BNP (last 3 results) No results for input(s): PROBNP in the last 8760 hours. HbA1C: No results for input(s): HGBA1C in the last 72 hours. CBG: No results for input(s): GLUCAP in the last 168 hours. Lipid Profile: No results for input(s): CHOL, HDL, LDLCALC, TRIG, CHOLHDL, LDLDIRECT in the last 72 hours. Thyroid Function Tests: No results for input(s): TSH, T4TOTAL, FREET4, T3FREE, THYROIDAB in the last 72 hours. Anemia Panel: No results for input(s): VITAMINB12, FOLATE, FERRITIN, TIBC, IRON, RETICCTPCT in the last 72 hours.    Radiology Studies: I have reviewed all of the imaging during this hospital visit personally     Scheduled Meds: . atorvastatin  40 mg Oral Daily  . gabapentin  100 mg Oral TID  . hydrochlorothiazide  12.5 mg Oral Daily  . ibuprofen  400 mg Oral TID  . lidocaine  1 patch Transdermal Q24H  . losartan  50 mg Oral Daily  . pantoprazole  40 mg Oral Daily  . senna-docusate  2 tablet Oral BID   Continuous Infusions:   LOS: 5 days        Mauricio Annett Gula, MD [facility] Pager [phone]

## 2018-02-04 NOTE — Progress Notes (Signed)
CSW met with patient and patient's wife to provide local bed offers. Patient's wife explained the difficulty with obtaining placement near [facility] and trying to make sure that the patient would be able to see his doctor's in [place] if an issue arises, so that's why she needs to look for a new facility. Patient and patient's wife will research options available and will contact CSW with choice.  CSW to follow.  Laveda Abbe, [place] Clinical Social Worker [phone]

## 2018-02-05 ENCOUNTER — Encounter (HOSPITAL_COMMUNITY): Payer: Self-pay | Admitting: Interventional Radiology

## 2018-02-05 ENCOUNTER — Inpatient Hospital Stay (HOSPITAL_COMMUNITY): Payer: Medicare Other

## 2018-02-05 HISTORY — PX: IR EPIDUROGRAPHY: IMG2365

## 2018-02-05 MED ORDER — METHYLPREDNISOLONE ACETATE 80 MG/ML IJ SUSP
INTRAMUSCULAR | Status: AC
  Administered 2018-02-05: 80 mg
  Filled 2018-02-05: qty 1

## 2018-02-05 MED ORDER — SODIUM CHLORIDE 0.9 % IJ SOLN
INTRAMUSCULAR | Status: AC
  Filled 2018-02-05: qty 10

## 2018-02-05 MED ORDER — LIDOCAINE HCL 1 % IJ SOLN
INTRAMUSCULAR | Status: AC
  Filled 2018-02-05: qty 20

## 2018-02-05 MED ORDER — LIDOCAINE HCL (PF) 1 % IJ SOLN
INTRAMUSCULAR | Status: DC | PRN
  Administered 2018-02-05: 2 mL

## 2018-02-05 MED ORDER — IOPAMIDOL (ISOVUE-M 200) INJECTION 41%
INTRAMUSCULAR | Status: AC
  Administered 2018-02-05: 1 mL
  Filled 2018-02-05: qty 10

## 2018-02-05 MED ORDER — METHYLPREDNISOLONE ACETATE 40 MG/ML IJ SUSP
INTRAMUSCULAR | Status: AC
  Administered 2018-02-05: 40 mg
  Filled 2018-02-05: qty 1

## 2018-02-05 MED ORDER — LIDOCAINE HCL (PF) 1 % IJ SOLN
INTRAMUSCULAR | Status: AC
  Filled 2018-02-05: qty 10

## 2018-02-05 NOTE — Consult Note (Signed)
Reason for Consult:left leg pain Referring Physician: [name] is an 67 y.o. male.  HPI: Jesse Hodges is a 67 y.o. male with medical history significant for CAD, PCI in Jan 2016 s/p one year of brilinta, (off for the past 2 years) multiple lumbar surgeries, degenerative disc disease L3-L4 and L4-L5 with radiculopathy, spinal stenosis s/p NSGY eval who presented to [facility] with refractory pain. He presented to [facility] Monday 7/29 with acute lower back pain. Pt was unable to get out of the bed that morning as he was "locked up." He states that since his most recent surgery 15 years ago he has done quite well but has had some increasing pain for the past 2-3 months, for which he takes ibuprofen prn. Reports that he mowed the yard on Sunday and woke up Monday morning with the severe pain and inability to walk. He was admitted to [facility] 7/29 and was transferred per his and his wife's request to [facility]. Family frustrated he has not had much of anything done. This morning, pt was on the bedpan and was lowering down and felt his back bend back in an abnormal way. States that almost immediately, he had a change in location of the pain from just the L side and L hip and leg, to now going across the midline to the R. No bladder or bowel incontinence. On occasion his L leg will be tingling, and he will lose sensation. He also gets leg muscle spasms that are very painful.  Muscle relaxers haven't seemed to help but given the number of meds he's been on it's hard to tell what has helped and what hasn't. Seen by NSGY at [facility] who recommended epidural injections, for which pt and his wife wanted to be transferred to [facility].  I was asked to see patient in consultation today.  Patient has been unable to get out of bed and walk.  He complains of severe and unrelenting pain.    Past Medical History:  Diagnosis Date  . Coronary artery disease   . DDD (degenerative disc disease), lumbar   . Dyslipidemia 07/16/2014  . Low  back pain     Past Surgical History:  Procedure Laterality Date  . BACK SURGERY    . CORONARY ANGIOPLASTY WITH STENT PLACEMENT  07/17/2014   "1"  . LEFT HEART CATHETERIZATION WITH CORONARY ANGIOGRAM N/A 07/17/2014   Procedure: LEFT HEART CATHETERIZATION WITH CORONARY ANGIOGRAM;  Surgeon: Jacolyn Reedy, MD;  Location: [facility] CATH LAB;  Service: Cardiovascular;  Laterality: N/A;  . LUMBAR LAMINECTOMY  ~ 1995; ~ 2000; ~ 2002; ~ 2004  . SHOULDER ARTHROSCOPY Right 02/2007   adhesion release    Family History  Problem Relation Age of Onset  . Hypertension Father     Social History:  reports that he has never smoked. He has never used smokeless tobacco. He reports that he drinks alcohol. He reports that he does not use drugs.  Allergies: No Known Allergies  Medications: I have reviewed the patient's current medications.  Results for orders placed or performed during the hospital encounter of 01/30/18 (from the past 48 hour(s))  Basic metabolic panel     Status: Abnormal   Collection Time: 02/04/18  3:56 AM  Result Value Ref Range   Sodium 135 135 - 145 mmol/L   Potassium 3.7 3.5 - 5.1 mmol/L   Chloride 98 98 - 111 mmol/L   CO2 28 22 - 32 mmol/L   Glucose, Bld 130 (H) 70 -  99 mg/dL   BUN 31 (H) 8 - 23 mg/dL   Creatinine, Ser 0.97 0.61 - 1.24 mg/dL   Calcium 9.0 8.9 - 10.3 mg/dL   GFR calc non Af Amer >60 >60 mL/min   GFR calc Af Amer >60 >60 mL/min    Comment: (NOTE) The eGFR has been calculated using the CKD EPI equation. This calculation has not been validated in all clinical situations. eGFR's persistently <60 mL/min signify possible Chronic Kidney Disease.    Anion gap 9 5 - 15    Comment: Performed at [facility] [address] [place], [place] [address]    No results found.  Review of Systems - Negative except As above    Blood pressure 116/87, pulse 71, temperature 98.6 F (37 C), temperature source Oral, resp. rate 18, height _0  (1.854 m), weight 90.6  kg (199 lb 11.8 oz), SpO2 90 %. Physical Exam  Constitutional: He is oriented to person, place, and time. He appears well-developed and well-nourished.  HENT:  Head: Normocephalic and atraumatic.  Eyes: Pupils are equal, round, and reactive to light. EOM are normal.  Neck: Normal range of motion. Neck supple.  Cardiovascular: Normal rate and regular rhythm.  Respiratory: Effort normal and breath sounds normal.  GI: Soft. Bowel sounds are normal.  Musculoskeletal:       Lumbar back: He exhibits tenderness.  Neurological: He is alert and oriented to person, place, and time. He has normal reflexes. No cranial nerve deficit or sensory deficit. GCS eye subscore is 4. GCS verbal subscore is 5. GCS motor subscore is 6. He displays no Babinski's sign on the right side. He displays no Babinski's sign on the left side.  Patient holds left leg in flexed position.  He has midline and left sciatic notch tenderness.  He has positive SLR and negative Patrick's test.  He has full strength in both legs in all motor groups.    The patient is comfortable lying in bed.  He has pain rolling from side to side and with lifting of his leg.  Assessment/Plan: Patient has undergone prior laminectomy for stenosis by Dr. Carloyn Manner and had his last surgery 15 years ago (he has had 4 low back surgeries).  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  He also has severe disc degeneration and recurrent stenosis at L 45.  There is milder disc degeneration and stenosis at the L 5 S 1 level.    I have recommended to patient that he undergo L eft L 4 SNRB per IR.  If he does well with this, he can go home and I will perform surgery on a semi-elective basis.  This will consist of Right XLIF L 34 and L 45 with percutaneous pedicle screw fixation.  If he does not get good relief, then my partner, Dr. Ellene Route, will perform this surgery while I am out of town (I will be gone  for the next two weeks).  Patient and his wife are in agreement with this plan.  Peggyann Shoals, MD 02/05/2018, 12:32 PM

## 2018-02-05 NOTE — Progress Notes (Signed)
No orders from MD about patient moving out of bed after procedure (nerve block). Patient made own chice to set up in bed

## 2018-02-05 NOTE — Progress Notes (Signed)
Physical Therapy Treatment Patient Details Name: Jesse Hodges MRN: [id number] DOB: 01-01-1951 Today's Date: 02/05/2018    History of Present Illness 67 year old male who presented with intractable back pain.  He does have significant past medical history for coronary artery disease, thickened spine degenerative disc disease, L3/L4 and L4/L5 with radiculopathy, and spinal stenosis.  Patient admitted to [facility] due to respiratory back pain.  Apparently patient had more than routine physical activity at home 24 hours later he developed severe back pain to the point where he was not able to ambulate.  He was admitted to [facility] for further management.    PT Comments    Patient seen for second time to assist with transfer back to bed from recliner post therapy session. Pt tolerated 5 minutes sitting up OOB. Pt and nursing staff educated on transfer technique and bed mobility. Continue to progress as tolerated.   Follow Up Recommendations  SNF;Supervision/Assistance - 24 hour     Equipment Recommendations  Rolling walker with 5" wheels;Wheelchair cushion (measurements PT);Wheelchair (measurements PT)    Recommendations for Other Services       Precautions / Restrictions Precautions Precautions: Back;Fall Precaution Booklet Issued: No Precaution Comments: reviewed all 3 back precautions with Pt Restrictions Weight Bearing Restrictions: No    Mobility  Bed Mobility Overal bed mobility: Needs Assistance Bed Mobility: Rolling;Sit to Sidelying Rolling: Min guard(significantly increased time, encouraged use of LLE) Sidelying to sit: Mod assist;+2 for physical assistance;+2 for safety/equipment;HOB elevated;Max assist(assist with lowering legs and elevating trunk, bed rail)     Sit to sidelying: +2 for physical assistance;+2 for safety/equipment;Max assist General bed mobility comments: assistance to lower trunk and bring bilat LE into bed with increased  time and cues for sequencing  Transfers Overall transfer level: Needs assistance Equipment used: (+2 with use of bed pad) Transfers: Lateral/Scoot Transfers          Lateral/Scoot Transfers: Total assist;+2 physical assistance;+2 safety/equipment General transfer comment: assistance to bring hips to EOB from recliner with pt's trunk supported and cues for sequencing and hand placement; increased time needed due to pt's anxiety  Ambulation/Gait                 Stairs             Wheelchair Mobility    Modified Rankin (Stroke Patients Only)       Balance Overall balance assessment: Needs assistance Sitting-balance support: Bilateral upper extremity supported Sitting balance-Leahy Scale: Poor Sitting balance - Comments: pt requires min A for sitting EOB, strained pressure on arms - Pt unable to relax EOB                                    Cognition Arousal/Alertness: Awake/alert Behavior During Therapy: Anxious Overall Cognitive Status: Impaired/Different from baseline Area of Impairment: Safety/judgement;Following commands;Awareness                       Following Commands: Follows one step commands with increased time Safety/Judgement: Decreased awareness of safety Awareness: Emergent   General Comments:       Exercises      General Comments General comments (skin integrity, edema, etc.): RN and NT present throughout; pt and staff provided education for transfer      Pertinent Vitals/Pain Pain Assessment: Faces Faces Pain Scale: Hurts worst Pain Location: back and left leg/hip Pain Descriptors /  Indicators: Grimacing;Guarding;Other (Comment) Pain Intervention(s): Limited activity within patient's tolerance;Monitored during session;Repositioned    Home Living                      Prior Function            PT Goals (current goals can now be found in the care plan section) Acute Rehab PT Goals Patient Stated  Goal: control the pain  Progress towards PT goals: Progressing toward goals    Frequency    Min 5X/week      PT Plan Current plan remains appropriate    Co-evaluation PT/OT/SLP Co-Evaluation/Treatment: Yes Reason for Co-Treatment: For patient/therapist safety;To address functional/ADL transfers(RN request) PT goals addressed during session: Mobility/safety with mobility OT goals addressed during session: Strengthening/ROM;ADL's and self-care      AM-PAC PT "6 Clicks" Daily Activity  Outcome Measure  Difficulty turning over in bed (including adjusting bedclothes, sheets and blankets)?: A Lot Difficulty moving from lying on back to sitting on the side of the bed? : Unable Difficulty sitting down on and standing up from a chair with arms (e.g., wheelchair, bedside commode, etc,.)?: Unable Help needed moving to and from a bed to chair (including a wheelchair)?: Total Help needed walking in hospital room?: Total Help needed climbing 3-5 steps with a railing? : Total 6 Click Score: 7    End of Session   Activity Tolerance: Patient limited by pain Patient left: in bed;with call bell/phone within reach;with nursing/sitter in room;with family/visitor present Nurse Communication: Mobility status PT Visit Diagnosis: Muscle weakness (generalized) (M62.81);Difficulty in walking, not elsewhere classified (R26.2);Pain Pain - Right/Left: Left Pain - part of body: Hip;Leg;Knee(back)     Time: [id number] PT Time Calculation (min) (ACUTE ONLY): 23 min  Charges:  $Therapeutic Activity: 8-22 mins                     Erline LevineKellyn Terrence Pizana, PTA Pager: [phone]     Carolynne EdouardKellyn R Tiffay Pinette 02/05/2018, 2:44 PM

## 2018-02-05 NOTE — Progress Notes (Signed)
Subjective: Patient reports "I can't move that (left) leg without the pain getting even worse"  Objective: Vital signs in last 24 hours: Temp:  [98 F (36.7 C)-98.7 F (37.1 C)] 98.6 F (37 C) (08/06 0742) Pulse Rate:  [71-89] 71 (08/06 0742) Resp:  [18-20] 18 (08/06 0742) BP: (116-126)/(83-90) 116/87 (08/06 0742) SpO2:  [90 %-99 %] 90 % (08/06 0742)  Intake/Output from previous day: 08/05 0701 - 08/06 0700 In: 960 [P.O.:960] Out: 1000 [Urine:1000] Intake/Output this shift: No intake/output data recorded.  Sitting in chair, lifting himself from the seat with his arms after essentially total assist of two from the bed. Wife present states pt has not been out of bed in 5 days. Pt states only position of comfort is supine with left knee flexed. Pt quite anxious, frustrated with persistent severe pain, stating he hopes for surgery as "a long term fix".  Pt calms somewhat with conversation. He does cooperate with exam. Hip flexors not assessed seated due to resultant lumbar pain. Good strength plantar/dorsiflexion bilat. Tenderness to palpation over lumbar spine. Pt notes normal urinary and fecal elimination since pain symptoms began, until this am. He passed some stool overnight.  Lab Results: No results for input(s): WBC, HGB, HCT, PLT in the last 72 hours. BMET Recent Labs    02/04/18 0356  NA 135  K 3.7  CL 98  CO2 28  GLUCOSE 130*  BUN 31*  CREATININE 0.97  CALCIUM 9.0    Studies/Results: No results found.  Assessment/Plan:   LOS: 6 days  Dr. Venetia MaxonStern will visit after OR this am. MRI obtained at [facility] will be reviewed.   Georgiann Cockeroteat, Ibraham Levi 02/05/2018, 10:49 AM

## 2018-02-05 NOTE — Progress Notes (Signed)
OT Treatment Note:  Clinical Impression: Pt seen in conjunction with PT and RN staff after Pt was only able to tolerate approx 5 min of sitting upright in recliner following therapy session. Pt stating that the pain was too great - noted perspiration on Pt's body (unable to get BP at this time due to strain on Pt's arm). PT/OT team demonstrated lateral scoot transfer technique with bed pad for RN staff as well as log roll technique for bed mobility.Rear peri care done in sidelying at bed level. Pt continues to require significantly increased time for movement and transfers as he is very limited by pain. OT will continue to follow acutely.     02/05/18 1121  OT Visit Information  Last OT Received On 02/05/18  Assistance Needed +2  PT/OT/SLP Co-Evaluation/Treatment Yes  Reason for Co-Treatment For patient/therapist safety;To address functional/ADL transfers;Other (comment) (RN request)  PT goals addressed during session Balance;Strengthening/ROM  OT goals addressed during session Strengthening/ROM;ADL's and self-care  History of Present Illness 67 year old male who presented with intractable back pain.  He does have significant past medical history for coronary artery disease, thickened spine degenerative disc disease, L3/L4 and L4/L5 with radiculopathy, and spinal stenosis.  Patient admitted to [facility] due to respiratory back pain.  Apparently patient had more than routine physical activity at home 24 hours later he developed severe back pain to the point where he was not able to ambulate.  He was admitted to [facility] for further management.  Precautions  Precautions Back;Fall  Precaution Booklet Issued No  Precaution Comments reviewed all 3 back precautions with Pt  Pain Assessment  Pain Assessment Faces  Faces Pain Scale 10  Pain Location back and left leg/hip  Pain Descriptors / Indicators Grimacing;Guarding;Other (Comment) ("It feels like its going to  explode")  Pain Intervention(s) Limited activity within patient's tolerance;Monitored during session;Repositioned  Cognition  Arousal/Alertness Awake/alert  Behavior During Therapy Anxious  Overall Cognitive Status Impaired/Different from baseline  Area of Impairment Safety/judgement;Following commands;Awareness  Following Commands Follows one step commands with increased time (Pt only follows commands of )  Safety/Judgement Decreased awareness of safety  Awareness Emergent  ADL  Overall ADL's  Needs assistance/impaired  Lower Body Dressing Total assistance;Bed level  Lower Body Dressing Details (indicate cue type and reason) to doff socks   Toilet Transfer Maximal assistance;+2 for physical assistance;+2 for safety/equipment;Requires drop arm (lateral transfer with bed pad)  Toilet Transfer Details (indicate cue type and reason) simulated from drop arm recliner return to bed  Toileting- Clothing Manipulation and Hygiene Total assistance;Bed level  Toileting - Clothing Manipulation Details (indicate cue type and reason) in sidelying for rear peri care  Bed Mobility  Overal bed mobility Needs Assistance  Bed Mobility Rolling;Sit to Sidelying  Rolling Min guard (significantly increased time, encouraged use of LLE)  Sit to sidelying Mod assist;+2 for physical assistance;+2 for safety/equipment (support and elevation of BLE, support in lowering of trunk)  General bed mobility comments vc for back precautionss throughout bed mobility  Balance  Overall balance assessment Needs assistance  Sitting-balance support Bilateral upper extremity supported  Sitting balance-Leahy Scale Poor  Sitting balance - Comments pt requires min A for sitting EOB, strained pressure on arms - Pt unable to relax EOB  Restrictions  Weight Bearing Restrictions No  Transfers  Overall transfer level Needs assistance  Equipment used 2 person hand held assist (bed pad)  Transfers Lateral/Scoot Transfers    Lateral/Scoot Transfers Total assist;+2 physical assistance;+2 safety/equipment  General  transfer comment drop arm recliner used, increased time required due to Pt's anxiety. Bed pad used heavily throughout transfer  General Comments  General comments (skin integrity, edema, etc.) RN's present to witness transfer and education provided to Pt and staff  OT - End of Session  Equipment Utilized During Treatment Other (comment) (bed pad)  Activity Tolerance Patient limited by pain  Patient left in chair;with call bell/phone within reach;with family/visitor present;Other (comment) (Neurosurgical RN in room with Pt)  Nurse Communication Mobility status;Other (comment) (in room to witness transfer)  OT Assessment/Plan  OT Plan Discharge plan remains appropriate;Frequency remains appropriate  OT Visit Diagnosis Unsteadiness on feet (R26.81);Muscle weakness (generalized) (M62.81);Other abnormalities of gait and mobility (R26.89);Pain  Pain - Right/Left Left  Pain - part of body Hip;Leg (back)  OT Frequency (ACUTE ONLY) Min 2X/week  Follow Up Recommendations SNF  OT Equipment None recommended by OT  AM-PAC OT "6 Clicks" Daily Activity Outcome Measure  Help from another person eating meals? 3  Help from another person taking care of personal grooming? 3  Help from another person toileting, which includes using toliet, bedpan, or urinal? 1  Help from another person bathing (including washing, rinsing, drying)? 2  Help from another person to put on and taking off regular upper body clothing? 2  Help from another person to put on and taking off regular lower body clothing? 1  6 Click Score 12  ADL G Code Conversion CL  OT Goal Progression  Progress towards OT goals Progressing toward goals  Acute Rehab OT Goals  Patient Stated Goal control the pain   OT Goal Formulation With patient  Time For Goal Achievement 02/16/18  Potential to Achieve Goals Good  OT Time Calculation  OT Start Time (ACUTE  ONLY) 1013  OT Stop Time (ACUTE ONLY) 1036  OT Time Calculation (min) 23 min  OT General Charges  $OT Visit 1 Visit  OT Treatments  $Therapeutic Activity 8-22 mins   Sherryl Manges OTR/L [phone]

## 2018-02-05 NOTE — Procedures (Signed)
L L4 NRB and transforaminal epidural steroid inj 120 mg depo 1 cc 1% preserve free lido EBL 0 Comp 0

## 2018-02-05 NOTE — Progress Notes (Addendum)
PROGRESS NOTE    Jesse Hodges  [id number] DOB: 01/16/1951 DOA: 01/30/2018 PCP: Clayborn Heronankins, Victoria R, MD    Brief Narrative:  67 year old male who presented with intractable back pain. He does have significant past medical history for coronary artery disease, thickened spine degenerative disc disease, L3/L4 and L4/L5 with radiculopathy, andspinal stenosis.Patient admitted to [facility] due to respiratory back pain. Apparently patient had more than routine physical activity at home 24 hours later he developed severe back pain to the point where he was not able to ambulate. He was admitted to [facility] for further management.  Patient received analgesics, further work-up with MRI, show persistent significant spine disease.Due to persistent symptoms he was transferred to [facility] for further evaluation   Assessment & Plan:   Principal Problem:   Low back pain Active Problems:   Dyslipidemia   CAD (coronary artery disease), native coronary artery   Chronic left lumbar radiculopathy   DDD (degenerative disc disease), lumbar   Spinal stenosis of lumbar region   Constipation due to pain medication therapy   Pressure injury of skin   1. Acute on chronic lumbar back pain with significant radiculopathy. Continue analgesic regimen with scheduled acetaminophen flexeril, gabapentin, ibuprofen and lidocaine patch, taper systemic steroids. As needed hydromorphone and oxycodone. Case discussed with Dr Venetia MaxonStern, will consult IR for left L4 nerve block and follow on response. Possible surgical intervention at a later time of persistent pain and decrease mobility.   2.Dyslipidemia.On atorvastatin.  3.Hypertension. Hydrochlorothiazideand losartan, for blood pressure control.   DVT prophylaxis:heparin Code Status:full Family Communication:no family at bedside today. Disposition Plan/ discharge barriers:will need SNF placement.     Consultants:  neurosurgery over the phone  Procedures:    Antimicrobials   Subjective: Patient continue to have back pain, worse with movement and seating, no nausea or vomiting, no chest pain or dyspnea.   Objective: Vitals:   02/04/18 1954 02/04/18 2308 02/05/18 0439 02/05/18 0742  BP: 121/90 126/84 124/83 116/87  Pulse: 81 82 79 71  Resp: 18 18 18 18   Temp: 98 F (36.7 C) 98.3 F (36.8 C) 98.7 F (37.1 C) 98.6 F (37 C)  TempSrc: Oral Oral Oral Oral  SpO2: 95% 98% 96% 90%  Weight:      Height:        Intake/Output Summary (Last 24 hours) at 02/05/2018 1625 Last data filed at 02/05/2018 0514 Gross per 24 hour  Intake 960 ml  Output 1000 ml  Net -40 ml   Filed Weights   01/31/18 2001  Weight: 90.6 kg (199 lb 11.8 oz)    Examination:   General: Not in pain or dyspnea, deconditioned  Neurology: Awake and alert, non focal  E ENT: no pallor, no icterus, oral mucosa moist Cardiovascular: No JVD. S1-S2 present, rhythmic, no gallops, rubs, or murmurs. No lower extremity edema. Pulmonary: vesicular breath sounds bilaterally, adequate air movement, no wheezing, rhonchi or rales. Gastrointestinal. Abdomen with no organomegaly, non tender, no rebound or guarding Skin. No rashes Musculoskeletal: no joint deformities     Data Reviewed: I have personally reviewed following labs and imaging studies  CBC: No results for input(s): WBC, NEUTROABS, HGB, HCT, MCV, PLT in the last 168 hours. Basic Metabolic Panel: Recent Labs  Lab 02/04/18 0356  NA 135  K 3.7  CL 98  CO2 28  GLUCOSE 130*  BUN 31*  CREATININE 0.97  CALCIUM 9.0   GFR: Estimated Creatinine Clearance: 84.7 mL/min (by C-G formula based on  SCr of 0.97 mg/dL). Liver Function Tests: No results for input(s): AST, ALT, ALKPHOS, BILITOT, PROT, ALBUMIN in the last 168 hours. No results for input(s): LIPASE, AMYLASE in the last 168 hours. No results for input(s): AMMONIA in the last 168  hours. Coagulation Profile: No results for input(s): INR, PROTIME in the last 168 hours. Cardiac Enzymes: No results for input(s): CKTOTAL, CKMB, CKMBINDEX, TROPONINI in the last 168 hours. BNP (last 3 results) No results for input(s): PROBNP in the last 8760 hours. HbA1C: No results for input(s): HGBA1C in the last 72 hours. CBG: No results for input(s): GLUCAP in the last 168 hours. Lipid Profile: No results for input(s): CHOL, HDL, LDLCALC, TRIG, CHOLHDL, LDLDIRECT in the last 72 hours. Thyroid Function Tests: No results for input(s): TSH, T4TOTAL, FREET4, T3FREE, THYROIDAB in the last 72 hours. Anemia Panel: No results for input(s): VITAMINB12, FOLATE, FERRITIN, TIBC, IRON, RETICCTPCT in the last 72 hours.    Radiology Studies: I have reviewed all of the imaging during this hospital visit personally     Scheduled Meds: . acetaminophen  650 mg Oral Q6H  . atorvastatin  40 mg Oral Daily  . cyclobenzaprine  5 mg Oral TID  . gabapentin  100 mg Oral TID  . hydrochlorothiazide  12.5 mg Oral Daily  . ibuprofen  400 mg Oral TID  . lidocaine  1 patch Transdermal Q24H  . lidocaine (PF)      . losartan  50 mg Oral Daily  . pantoprazole  40 mg Oral Daily  . senna-docusate  2 tablet Oral BID   Continuous Infusions:   LOS: 6 days        Stanton Kissoon Annett Gula, MD [facility] Pager [phone]

## 2018-02-05 NOTE — Progress Notes (Signed)
Occupational Therapy Treatment Patient Details Name: Jesse Hodges MRN: [id number] DOB: 01/08/1951 Today's Date: 02/05/2018    History of present illness 67 year old male who presented with intractable back pain.  He does have significant past medical history for coronary artery disease, thickened spine degenerative disc disease, L3/L4 and L4/L5 with radiculopathy, and spinal stenosis.  Patient admitted to [facility] due to respiratory back pain.  Apparently patient had more than routine physical activity at home 24 hours later he developed severe back pain to the point where he was not able to ambulate.  He was [facility] to [facility] for further management.   OT comments  Pt progressing towards OT goals, he was able to transfer out of the bed to the drop arm recliner with heavy use of the bed pad and max to total A of 2 via lateral scoot transfer. All movement and tasks take significantly increased time. Pt unable to participate in any grooming activities sitting EOB or in the recliner because "I have to hold myself up or I'll pass out" Explained that Pt was safely in chair and offered multiple options for re-positioning - Pt declined. Neurosurgical RN entered, and Pt was left safely in chair with wife present.  Follow Up Recommendations  SNF    Equipment Recommendations  None recommended by OT    Recommendations for Other Services      Precautions / Restrictions Precautions Precautions: Back;Fall Precaution Booklet Issued: No Precaution Comments: reviewed all 3 back precautions with Pt Restrictions Weight Bearing Restrictions: No       Mobility Bed Mobility Overal bed mobility: Needs Assistance Bed Mobility: Sidelying to Sit;Rolling Rolling: Min guard(significantly increased time, encouraged use of LLE) Sidelying to sit: Mod assist;+2 for physical assistance;+2 for safety/equipment;HOB elevated(assist with lowering legs and elevating trunk, bed rail)          Transfers Overall transfer level: Needs assistance Equipment used: 2 person hand held assist(bed pad) Transfers: Lateral/Scoot Transfers          Lateral/Scoot Transfers: Total assist;+2 physical assistance;+2 safety/equipment General transfer comment: drop arm recliner used, increased time required due to Pt's anxiety. Bed pad used heavily throughout transfer    Balance Overall balance assessment: Needs assistance Sitting-balance support: Bilateral upper extremity supported Sitting balance-Leahy Scale: Poor Sitting balance - Comments: pt requires min A for sitting EOB, strained pressure on arms - Pt unable to relax EOB                                   ADL either performed or assessed with clinical judgement   ADL Overall ADL's : Needs assistance/impaired                     Lower Body Dressing: Total assistance;Bed level Lower Body Dressing Details (indicate cue type and reason): to don socks  Toilet Transfer: Maximal assistance;+2 for physical assistance;+2 for safety/equipment;Requires drop arm(lateral transfer with bed pad) Toilet Transfer Details (indicate cue type and reason): simulated through bed to drop arm recliner Toileting- Clothing Manipulation and Hygiene: Total assistance;Bed level Toileting - Clothing Manipulation Details (indicate cue type and reason): in sidelying for rear peri care             Vision       Perception     Praxis      Cognition Arousal/Alertness: Awake/alert Behavior During Therapy: Anxious Overall Cognitive Status: Impaired/Different from baseline Area  of Impairment: Safety/judgement;Following commands;Awareness                       Following Commands: Follows one step commands with increased time(Pt only follows commands of ) Safety/Judgement: Decreased awareness of safety Awareness: Emergent   General Comments: Pt does not listen to education or precautions provided by staff. When  asked what he can do (i.e. move your legs) instead of trying he just states "I Can't" He likes to be in control of the situation - staff made extraordinary efforts to work WITH the patient trying to listen and provide the best most supportive care in a collaborative effort, and Pt only wants to do things his way        Exercises     Shoulder Instructions       General Comments Pt self-limiting in attempts to try good body mechanics, MMT, and generalized strategies/compensatory techniques to keep back safe and decrease pain during functional activities    Pertinent Vitals/ Pain       Pain Assessment: 0-10 Pain Score: 6  Faces Pain Scale: Hurts whole lot Pain Location: back and left leg/hip Pain Descriptors / Indicators: Grimacing;Guarding;Other (Comment)("It feels like its going to explode") Pain Intervention(s): Limited activity within patient's tolerance;Monitored during session;Repositioned;Premedicated before session  Home Living                                          Prior Functioning/Environment              Frequency  Min 2X/week        Progress Toward Goals  OT Goals(current goals can now be found in the care plan section)  Progress towards OT goals: Progressing toward goals  Acute Rehab OT Goals Patient Stated Goal: control the pain  OT Goal Formulation: With patient Time For Goal Achievement: 02/16/18 Potential to Achieve Goals: Good  Plan Discharge plan remains appropriate;Frequency remains appropriate    Co-evaluation    PT/OT/SLP Co-Evaluation/Treatment: Yes Reason for Co-Treatment: For patient/therapist safety;To address functional/ADL transfers;Other (comment)(activity tolerance) PT goals addressed during session: Balance;Proper use of DME;Mobility/safety with mobility OT goals addressed during session: Strengthening/ROM      AM-PAC PT "6 Clicks" Daily Activity     Outcome Measure   Help from another person eating meals?:  A Little Help from another person taking care of personal grooming?: A Little Help from another person toileting, which includes using toliet, bedpan, or urinal?: Total Help from another person bathing (including washing, rinsing, drying)?: A Lot Help from another person to put on and taking off regular upper body clothing?: A Lot Help from another person to put on and taking off regular lower body clothing?: Total 6 Click Score: 12    End of Session Equipment Utilized During Treatment: Other (comment)(bed pad)  OT Visit Diagnosis: Unsteadiness on feet (R26.81);Muscle weakness (generalized) (M62.81);Other abnormalities of gait and mobility (R26.89);Pain Pain - Right/Left: Left Pain - part of body: Hip;Leg(back)   Activity Tolerance Patient limited by pain   Patient Left in chair;with call bell/phone within reach;with family/visitor present;Other (comment)(Neurosurgical RN in room with Pt)   Nurse Communication Mobility status;Other (comment)(how to transfer back)        Time: 1610-9604 OT Time Calculation (min): 37 min  Charges: OT General Charges $OT Visit: 1 Visit OT Treatments $Therapeutic Activity: 8-22 mins Sherryl Manges OTR/L [phone]  Jesse Hodges 02/05/2018, 11:14 AM

## 2018-02-06 DIAGNOSIS — K5903 Drug induced constipation: Secondary | ICD-10-CM

## 2018-02-06 DIAGNOSIS — M5136 Other intervertebral disc degeneration, lumbar region: Secondary | ICD-10-CM

## 2018-02-06 DIAGNOSIS — M48061 Spinal stenosis, lumbar region without neurogenic claudication: Secondary | ICD-10-CM

## 2018-02-06 MED ORDER — PANTOPRAZOLE SODIUM 40 MG PO TBEC: 40.0000 mg | DELAYED_RELEASE_TABLET | Freq: Every day | ORAL | 0 refills | Status: DC

## 2018-02-06 MED ORDER — ACETAMINOPHEN 325 MG PO TABS: 650.0000 mg | ORAL_TABLET | Freq: Three times a day (TID) | ORAL | 0 refills | Status: AC

## 2018-02-06 MED ORDER — CYCLOBENZAPRINE HCL 5 MG PO TABS: 5.0000 mg | ORAL_TABLET | Freq: Three times a day (TID) | ORAL | 0 refills | Status: DC

## 2018-02-06 MED ORDER — SENNOSIDES-DOCUSATE SODIUM 8.6-50 MG PO TABS: 2.0000 | ORAL_TABLET | Freq: Two times a day (BID) | ORAL | 0 refills | Status: DC

## 2018-02-06 MED ORDER — GABAPENTIN 100 MG PO CAPS: 100.0000 mg | ORAL_CAPSULE | Freq: Three times a day (TID) | ORAL | 0 refills | Status: DC

## 2018-02-06 MED ORDER — LIDOCAINE 5 % EX PTCH: 1.0000 | MEDICATED_PATCH | CUTANEOUS | 0 refills | Status: DC

## 2018-02-06 MED ORDER — OXYCODONE HCL 5 MG PO TABS: 5.0000 mg | ORAL_TABLET | Freq: Four times a day (QID) | ORAL | 0 refills | Status: DC | PRN

## 2018-02-06 NOTE — Plan of Care (Signed)
Adequate for discharge.

## 2018-02-06 NOTE — Clinical Social Work Placement (Signed)
Nurse to call report to [phone], Room 115     CLINICAL SOCIAL WORK PLACEMENT  NOTE  Date:  02/06/2018  Patient Details  Name: Jesse Hodges M Bon[place]onna MRN: [id number] Date of Birth: 11/24/1950  Clinical Social Work is seeking post-discharge placement for this patient at the Skilled  Nursing Facility level of care (*CSW will initial, date and re-position this form in  chart as items are completed):  Yes   Patient/family provided with [place] Clinical Social Work Department's list of facilities offering this level of care within the geographic area requested by the patient (or if unable, by the patient's family).  Yes   Patient/family informed of their freedom to choose among providers that offer the needed level of care, that participate in Medicare, Medicaid or managed care program needed by the patient, have an available bed and are willing to accept the patient.  Yes   Patient/family informed of [facility]'s ownership interest in [facility] and [facility], as well as of the fact that they are under no obligation to receive care at these facilities.  PASRR submitted to EDS on 02/03/18     PASRR number received on 02/03/18     Existing PASRR number confirmed on       FL2 transmitted to all facilities in geographic area requested by pt/family on 02/03/18     FL2 transmitted to all facilities within larger geographic area on       Patient informed that his/her managed care company has contracts with or will negotiate with certain facilities, including the following:        Yes   Patient/family informed of bed offers received.  Patient chooses bed at [facility]     Physician recommends and patient chooses bed at      Patient to be transferred to [facility] on 02/06/18.  Patient to be transferred to facility by [facility]     Patient family notified on 02/06/18 of transfer.  Name of family member notified:        PHYSICIAN        Additional Comment:    _______________________________________________ Baldemar LenisElizabeth M Asah Lamay, LCSW 02/06/2018, 11:05 AM

## 2018-02-06 NOTE — Progress Notes (Signed)
CSW met with patient and patient's wife to discuss SNF options. Patient and patient's wife have chosen [facility].   CSW to follow when patient is medically ready for discharge to SNF.  Elizabeth Paisley, LCSW Clinical Social Worker [phone]  

## 2018-02-06 NOTE — Progress Notes (Signed)
Subjective: Patient reports When I sat up and twisted, all the pain came back"  Objective: Vital signs in last 24 hours: Temp:  [98.1 F (36.7 C)-99 F (37.2 C)] 98.6 F (37 C) (08/07 0748) Pulse Rate:  [70-88] 80 (08/07 0748) Resp:  [18] 18 (08/07 0748) BP: (113-126)/(76-86) 123/85 (08/07 0748) SpO2:  [95 %-99 %] 95 % (08/07 0748)  Intake/Output from previous day: 08/06 0701 - 08/07 0700 In: 240 [P.O.:240] Out: 1500 [Urine:1500] Intake/Output this shift: Total I/O In: 240 [P.O.:240] Out: -   Alert, conversant. Supine with left hip and knee flexed - without pain currently. Good strength BLE.  Lumbar pain when straightening LLE or twisting/ bending at waist.  Lab Results: No results for input(s): WBC, HGB, HCT, PLT in the last 72 hours. BMET Recent Labs    02/04/18 0356  NA 135  K 3.7  CL 98  CO2 28  GLUCOSE 130*  BUN 31*  CREATININE 0.97  CALCIUM 9.0    Studies/Results: Ir Epidurography  Result Date: 02/05/2018 CLINICAL DATA:  Lumbosacral spondylosis without myelopathy. Left L4 radiculopathy. Postoperative changes at L3-4 and L4-5. EXAM: TRANSFORAMINAL SELECTIVE EPIDURAL CORTICOSTEROID INJECTION FLUOROSCOPY TIME:  36 seconds. PROCEDURE: The procedure, risks, benefits, and alternatives were explained to the patient. Questions regarding the procedure were encouraged and answered. The patient understands and consents to the procedure. LEFT L4 NERVE ROOT BLOCK AND TRANSFORAMINAL EPIDURAL: A posterior oblique approach was taken to the intervertebral foramen on the left at L4-5 using a curved 22 gauge spinal needle. Injection of Omnipaque 180 outlined the left L4 nerve root and showed good epidural spread. No vascular opacification is seen. One hundred twenty mg of Depo-Medrol mixed with 1 cc 1% lidocaine were instilled. The procedure was well-tolerated, and the patient was discharged thirty minutes following the injection in good condition. COMPLICATIONS: None IMPRESSION:  Technically successful injection consisting of a left L4 nerve root block and transforaminal epidural. Electronically Signed   By: Jolaine ClickArthur  Hoss M.D.   On: 02/05/2018 16:21    Assessment/Plan: stable  LOS: 7 days  From NS perspective, pt should mobilize as tolerated. Discussed at length with pt: injection often takes several days to see max benefit; sometimes causes increased pain for a few days (thankfully not the case here). Should work with PT/OT and see how things evolve. Pt is hoping for surgery next week. I reminded him that surgery is not yet scheduled, awaiting any benefit of injection.   Georgiann Cockeroteat, Gilmar Bua 02/06/2018, 7:58 AM

## 2018-02-06 NOTE — Discharge Summary (Signed)
Discharge Summary  Jesse Hodges WUJ:[id number] DOB: [birth year]  PCP: Clayborn Heron, MD  Admit date: 01/30/2018 Discharge date: 02/06/2018  Time spent: , more than 50% time spent on coordination of care.  Recommendations for Outpatient Follow-up:  1. F/u with SNF MD  for hospital discharge follow up, repeat cbc/bmp at follow up 2. F/u with neurosurgery Dr Venetia Maxon in one week to schedule surgery  Discharge Diagnoses:  Active Hospital Problems   Diagnosis Date Noted  . Low back pain 01/28/2018  . Pressure injury of skin 02/03/2018  . Chronic left lumbar radiculopathy 01/30/2018  . DDD (degenerative disc disease), lumbar 01/30/2018  . Spinal stenosis of lumbar region 01/30/2018  . Constipation due to pain medication therapy 01/30/2018  . CAD (coronary artery disease), native coronary artery 07/17/2014  . Dyslipidemia 07/16/2014    Resolved Hospital Problems  No resolved problems to display.    Discharge Condition: stable  Diet recommendation: heart healthy  Filed Weights   01/31/18 2001  Weight: 90.6 kg (199 lb 11.8 oz)    History of present illness: (per admitting MD Dr Morrison Old) Patient coming from:  Home - lives with wife; NOK: wife   Chief Complaint: Intractable back pain  HPI: Jesse Hodges is a 67 y.o. male with medical history significant for CAD, PCI in Jan 2016 s/p one year of brilinta, (off for the past 2 years) multiple lumbar surgeries, degenerative disc disease L3-L4 and L4-L5 with radiculopathy, spinal stenosis s/p NSGY eval who presented to [facility] with refractory pain. He presented to [facility] Monday 7/29 with acute lower back pain. Pt was unable to get out of the bed that morning as he was "locked up." He states that since his most recent surgery 15 years ago he has done quite well but has had some increasing pain for the past 2-3 months, for which he takes ibuprofen prn. Reports that he mowed the yard on Sunday and woke up Monday morning with the severe  pain and inability to walk. He was admitted to [facility] 7/29 and was transferred per his and his wife's request here today. Family frustrated he has not had much of anything done. This morning, pt was on the bedpan and was lowering down and felt his back bend back in an abnormal way. States that almost immediately, he had a change in location of the pain from just the L side and L hip and leg, to now going across the midline to the R. No bladder or bowel incontinence. On occasion his L leg will be tingling, and he will lose sensation. He also gets leg muscle spasms that are very painful. They have been asking for nerve pain med, and today he got first dose of neurontin. He was also started on a medrol dose pak on Monday. Muscle relaxers haven't seemed to help but given the number of meds he's been on it's hard to tell what has helped and what hasn't. Seen by NSGY at [facility] who recommended epidural injections, for which pt and his wife wanted to be transferred to [facility].  Had morphine, vicodin today, prior to transfer and during transport. Pain got down to 3-4 after taking 2 vicodin today which is best it's been since Monday. Currently is at a 7. Holding L knee in flexion helps with the pinching/ sciatic pain temporarily.  Pt's last back surgery was 35 y ago by Dr. Trey Sailors in [place], [place].  Please see note by Dr. Ophelia Charter from today regarding decision making timeline -  ultimately pt was transferred here because they hope IR can do epidural spinal injection as inpatient; however even if this cannot be done, they wanted to be transferred here due to the frustration given what has and hasn't occurred at OSH since his admission.    Hospital Course:  Principal Problem:   Low back pain Active Problems:   Dyslipidemia   CAD (coronary artery disease), native coronary artery   Chronic left lumbar radiculopathy   DDD (degenerative disc disease), lumbar   Spinal stenosis of lumbar region   Constipation due to pain  medication therapy   Pressure injury of skin    Acute on chronic lumbar back pain with significant radiculopathy. Patient is transferred from outside hospital for epidural injection by IR and neurosurgery consult. He is s/p L L4 NRB and transforaminal epidural steroid inj on 8/6 -neuro surgery Dr Venetia Maxon consulted -he is discharged to snf with close follow up with neurosurgery Dr Venetia Maxon to schedule surgery -Continue analgesic regimen with scheduledacetaminophen, flexeril, gabapentin, and lidocaine patch, prn oxycodone - he received systemic steroids. Has been tapered off. -home meds asa held in anticipate surgery.     Coronary artery disease with drug eluting stent by Dr Katrinka Blazing in 2016 He followed with cardiology Dr Donnie Aho. Denies chest pain Continue statin, asa held due to anticipating back surgery  Dyslipidemia.On atorvastatin.  Hypertension. on home  meds Hydrochlorothiazideand losartan, for blood pressure control.   DVT prophylaxis:scd's Code Status:full Family Communication:no family at bedside today. Disposition Plan/ discharge barriers: SNF placement.   Consultants:  neurosurgery Dr Venetia Maxon  IR Dr Bonnielee Haff  Procedures: L L4 NRB and transforaminal epidural steroid inj 120 mg depo 1 cc 1% preserve free lido On 8/6 by IR Dr Bonnielee Haff  Antimicrobials none  Discharge Exam: BP 123/85 (BP Location: Right Arm)   Pulse 80   Temp 98.6 F (37 C) (Oral)   Resp 18   Ht 6\' 1"  (1.854 m)   Wt 90.6 kg (199 lb 11.8 oz)   SpO2 95%   BMI 26.35 kg/m   General: NAD Cardiovascular: RRR Respiratory: CTABL  Discharge Instructions You were cared for by a hospitalist during your hospital stay. If you have any questions about your discharge medications or the care you received while you were in the hospital after you are discharged, you can call the unit and asked to speak with the hospitalist on call if the hospitalist that took care of you is not available. Once  you are discharged, your primary care physician will handle any further medical issues. Please note that NO REFILLS for any discharge medications will be authorized once you are discharged, as it is imperative that you return to your primary care physician (or establish a relationship with a primary care physician if you do not have one) for your aftercare needs so that they can reassess your need for medications and monitor your lab values.  Discharge Instructions    Diet - low sodium heart healthy   Complete by:  As directed    Increase activity slowly   Complete by:  As directed      Allergies as of 02/06/2018   No Known Allergies     Medication List    STOP taking these medications   aspirin 81 MG tablet     TAKE these medications   acetaminophen 325 MG tablet Commonly known as:  TYLENOL Take 2 tablets (650 mg total) by mouth every 8 (eight) hours for 7 days.   atorvastatin 40 MG  tablet Commonly known as:  LIPITOR Take 1 tablet (40 mg total) by mouth daily. What changed:  when to take this   cyclobenzaprine 5 MG tablet Commonly known as:  FLEXERIL Take 1 tablet (5 mg total) by mouth 3 (three) times daily.   gabapentin 100 MG capsule Commonly known as:  NEURONTIN Take 1 capsule (100 mg total) by mouth 3 (three) times daily.   hydrochlorothiazide 12.5 MG capsule Commonly known as:  MICROZIDE Take 12.5 mg by mouth daily. Take with losartan   ibuprofen 200 MG tablet Commonly known as:  ADVIL,MOTRIN Take 400 mg by mouth every 4 (four) hours as needed for moderate pain.   lidocaine 5 % Commonly known as:  LIDODERM Place 1 patch onto the skin daily. Remove & Discard patch within 12 hours or as directed by MD   losartan 50 MG tablet Commonly known as:  COZAAR Take 50 mg by mouth daily. Take with hctz   NITROSTAT 0.4 MG SL tablet Generic drug:  nitroGLYCERIN Place 0.4 mg under the tongue every 5 (five) minutes as needed for chest pain.   oxyCODONE 5 MG immediate  release tablet Commonly known as:  ROXICODONE Take 1 tablet (5 mg total) by mouth every 6 (six) hours as needed for breakthrough pain.   pantoprazole 40 MG tablet Commonly known as:  PROTONIX Take 1 tablet (40 mg total) by mouth daily. Start taking on:  02/07/2018   senna-docusate 8.6-50 MG tablet Commonly known as:  Senokot-S Take 2 tablets by mouth 2 (two) times daily.      No Known Allergies  Contact information for follow-up providers    Maeola Harman, MD Follow up in 1 week(s).   Specialty:  Neurosurgery Contact information: [address] [place] [address] [phone]            Contact information for after-discharge care    Destination    [facility] .   Service:  Skilled Nursing Contact information: [address] [place] [address] [phone]                   The results of significant diagnostics from this hospitalization (including imaging, microbiology, ancillary and laboratory) are listed below for reference.    Significant Diagnostic Studies: Dg Lumbar Spine Complete  Result Date: 01/28/2018 CLINICAL DATA:  Acute left-sided low back pain after mowing lawn. EXAM: LUMBAR SPINE - COMPLETE 4+ VIEW COMPARISON:  None. FINDINGS: Mild levoscoliosis of lumbar spine is noted. No fracture or spondylolisthesis is noted. Moderate degenerative disc disease is noted at L2-3, L3-4 and L4-5 with anterior osteophyte formation. IMPRESSION: Moderate multilevel degenerative disc disease. No acute abnormality seen in the lumbar spine. Electronically Signed   By: Lupita Raider, M.D.   On: 01/28/2018 12:40   Ct Lumbar Spine Wo Contrast  Result Date: 01/28/2018 CLINICAL DATA:  Low back pain with bilateral leg pain and weakness beginning after mowing the yard 2 days ago. EXAM: CT LUMBAR SPINE WITHOUT CONTRAST TECHNIQUE: Multidetector CT imaging of the lumbar spine was performed without intravenous  contrast administration. Multiplanar CT image reconstructions were also generated. COMPARISON:  Lumbar spine radiographs 01/28/2018 FINDINGS: Segmentation: Standard. Alignment: Mild lumbar levoscoliosis. Straightening of the normal lumbar lordosis. Trace retrolisthesis of L5 on S1. Vertebrae: No acute fracture or destructive osseous process. Severe asymmetric right-sided disc space narrowing at L3-4 and L4-5 with vacuum disc. Mild disc space narrowing at L2-3 and L5-S1. Paraspinal and other soft tissues:  Minimal aortic atherosclerosis without aneurysm. Disc levels: L1-2: Mild disc bulging without stenosis. L2-3: Circumferential disc bulging, annular calcification, and mild facet and ligamentum flavum hypertrophy result in mild spinal stenosis and minimal left neural foraminal stenosis. L3-4: Prior left laminotomy. Circumferential disc bulging, endplate spurring, and mild facet hypertrophy result in right greater than left lateral recess stenosis and mild bilateral neural foraminal stenosis. L4-5: Prior left laminectomy. Disc bulging, endplate spurring, and mild facet hypertrophy result in mild right neural foraminal stenosis and likely mild right lateral recess stenosis without significant spinal stenosis. L5-S1: Disc bulging results in mild bilateral neural foraminal stenosis and likely mild left greater than right lateral recess stenosis without significant spinal stenosis. IMPRESSION: 1. No acute osseous abnormality. 2. Postoperative changes and advanced disc degeneration at L3-4 and L4-5. 3. Mild multilevel neural foraminal and lateral recess stenosis. 4. Mild spinal stenosis at L2-3. Electronically Signed   By: Sebastian Ache M.D.   On: 01/28/2018 13:16   Mr Lumbar Spine Wo Contrast  Result Date: 01/28/2018 CLINICAL DATA:  Low back pain radiating to the left knee. Prior lumbar surgery. EXAM: MRI LUMBAR SPINE WITHOUT CONTRAST TECHNIQUE: Multiplanar, multisequence MR imaging of the lumbar spine was performed.  No intravenous contrast was administered. COMPARISON:  Lumbar spine CT 01/28/2018 FINDINGS: Segmentation:  Standard. Alignment: Mild lumbar levoscoliosis. Straightening of the normal lumbar lordosis. Trace retrolisthesis of L5 on S1. Vertebrae: No fracture or suspicious osseous lesion. Mixed type 1 and type 2 degenerative endplate changes at L3-4. Conus medullaris and cauda equina: Conus extends to the L1 level. Conus and cauda equina appear normal. Paraspinal and other soft tissues: Prominent lymphatics anterior to the lumbar spine in the retroperitoneum. Disc levels: Disc desiccation throughout the lumbar spine. Severe disc space narrowing asymmetrically greater on the right at L3-4 and L4-5 with milder narrowing at L2-3 and L5-S1. T12-L1: Negative. L1-2: Small left paracentral disc protrusion without stenosis. L2-3: Mild circumferential disc bulging, small left foraminal disc protrusion, and mild facet arthrosis result in mild bilateral lateral recess stenosis, borderline to mild spinal stenosis, and minimal left neural foraminal narrowing. L3-4: Prior left laminectomy. Circumferential disc bulging, a possible tiny left subarticular disc extrusion, and mild facet hypertrophy result in moderate bilateral lateral recess stenosis, mild spinal stenosis, and mild bilateral neural foraminal stenosis. Potential bilateral L4 nerve root impingement. L4-5: Prior left laminectomy. Circumferential disc bulging, small superimposed right paracentral disc protrusion, and mild facet hypertrophy result in mild to moderate right and mild left lateral recess stenosis and mild bilateral neural foraminal stenosis without spinal stenosis. L5-S1: Disc bulging results in mild left greater than right lateral recess stenosis and mild bilateral neural foraminal stenosis without spinal stenosis. IMPRESSION: 1. Postoperative changes and advanced disc degeneration at L3-4 and L4-5 resulting in moderate lateral recess stenosis more notable at  L3-4. Potential L4 nerve root impingement. 2. Mild multilevel neural foraminal stenosis. 3. Mild spinal stenosis at L2-3. Electronically Signed   By: Sebastian Ache M.D.   On: 01/28/2018 16:26   Ir Epidurography  Result Date: 02/05/2018 CLINICAL DATA:  Lumbosacral spondylosis without myelopathy. Left L4 radiculopathy. Postoperative changes at L3-4 and L4-5. EXAM: TRANSFORAMINAL SELECTIVE EPIDURAL CORTICOSTEROID INJECTION FLUOROSCOPY TIME:  36 seconds. PROCEDURE: The procedure, risks, benefits, and alternatives were explained to the patient. Questions regarding the procedure were encouraged and answered. The patient understands and consents to the procedure. LEFT L4 NERVE ROOT BLOCK AND TRANSFORAMINAL EPIDURAL: A posterior oblique approach was taken to the intervertebral foramen on the left at L4-5 using  a curved 22 gauge spinal needle. Injection of Omnipaque 180 outlined the left L4 nerve root and showed good epidural spread. No vascular opacification is seen. One hundred twenty mg of Depo-Medrol mixed with 1 cc 1% lidocaine were instilled. The procedure was well-tolerated, and the patient was discharged thirty minutes following the injection in good condition. COMPLICATIONS: None IMPRESSION: Technically successful injection consisting of a left L4 nerve root block and transforaminal epidural. Electronically Signed   By: Jolaine ClickArthur  Hoss M.D.   On: 02/05/2018 16:21    Microbiology: No results found for this or any previous visit (from the past 240 hour(s)).   Labs: Basic Metabolic Panel: Recent Labs  Lab 02/04/18 0356  NA 135  K 3.7  CL 98  CO2 28  GLUCOSE 130*  BUN 31*  CREATININE 0.97  CALCIUM 9.0   Liver Function Tests: No results for input(s): AST, ALT, ALKPHOS, BILITOT, PROT, ALBUMIN in the last 168 hours. No results for input(s): LIPASE, AMYLASE in the last 168 hours. No results for input(s): AMMONIA in the last 168 hours. CBC: No results for input(s): WBC, NEUTROABS, HGB, HCT, MCV, PLT  in the last 168 hours. Cardiac Enzymes: No results for input(s): CKTOTAL, CKMB, CKMBINDEX, TROPONINI in the last 168 hours. BNP: BNP (last 3 results) No results for input(s): BNP in the last 8760 hours.  ProBNP (last 3 results) No results for input(s): PROBNP in the last 8760 hours.  CBG: No results for input(s): GLUCAP in the last 168 hours.     Signed:  Albertine GratesFang Liliauna Santoni MD, PhD  [facility] 02/06/2018, 11:00 AM

## 2018-02-06 NOTE — Care Management Note (Signed)
Case Management Note  Patient Details  Name: Phylliss Bobhomas M Rann MRN: [id number] Date of Birth: 04/04/1951  Subjective/Objective:                    Action/Plan: Pt discharging to [facility] today. CM signing off.   Expected Discharge Date:  02/06/18               Expected Discharge Plan:  Skilled Nursing Facility  In-House Referral:  Clinical Social Work  Discharge planning Services     Post Acute Care Choice:    Choice offered to:     DME Arranged:    DME Agency:     HH Arranged:    HH Agency:     Status of Service:  Completed, signed off  If discussed at [employer] Meetings, dates discussed:    Additional Comments:  Kermit BaloKelli F Tahirah Sara, RN 02/06/2018, 11:07 AM

## 2018-02-07 ENCOUNTER — Encounter: Payer: Self-pay | Admitting: Internal Medicine

## 2018-02-07 ENCOUNTER — Non-Acute Institutional Stay: Payer: Federal, State, Local not specified - PPO | Admitting: Internal Medicine

## 2018-02-07 DIAGNOSIS — I10 Essential (primary) hypertension: Secondary | ICD-10-CM

## 2018-02-07 DIAGNOSIS — M5416 Radiculopathy, lumbar region: Secondary | ICD-10-CM | POA: Diagnosis not present

## 2018-02-07 DIAGNOSIS — M5136 Other intervertebral disc degeneration, lumbar region: Secondary | ICD-10-CM

## 2018-02-07 DIAGNOSIS — M51369 Other intervertebral disc degeneration, lumbar region without mention of lumbar back pain or lower extremity pain: Secondary | ICD-10-CM

## 2018-02-07 NOTE — Assessment & Plan Note (Signed)
Low dose Neurontin can be titrated as needed

## 2018-02-07 NOTE — Assessment & Plan Note (Signed)
BP controlled; no change in antihypertensive medications  

## 2018-02-07 NOTE — Assessment & Plan Note (Addendum)
Dr Venetia MaxonStern will schedule neurosurgical intervention Continue combination of Tylenol and NSAIDS with PPI prophylaxis

## 2018-02-07 NOTE — Patient Instructions (Signed)
See assessment and plan under each diagnosis in the problem list and acutely for this visit 

## 2018-02-07 NOTE — Progress Notes (Signed)
NURSING HOME LOCATION:  [facility] ROOM NUMBER:  [id number]  CODE STATUS:  Full Code  PCP:  Clayborn Heronankins, Victoria R, MD  [address] [place] [address]  This is a comprehensive admission note to [facility] performed on this date less than 30 days from date of admission. Included are preadmission medical/surgical history; reconciled medication list; family history; social history and comprehensive review of systems. Corrections and additions to the records were documented. Comprehensive physical exam was also performed. Additionally a clinical summary was entered for each active diagnosis pertinent to this admission in the Problem List to enhance continuity of care.  HPI: The patient was hospitalized 7/31- 02/06/18 with intractable back pain.  This is in the context of multiple lumbar surgical procedures for spinal stenosis and degenerative disc disease associated with radiculopathy.The most recent lumbar surgical procedure had been 15 years ago but the patient was describing increasing pain over 2-453-months PTA despite taking ibuprofen as needed.  Pain was severe enough that he was unable to ambulate. Initially the pain involved the left lumbosacral area, left hip, and left lower extremity but subsequently radiated to the right lumbar sacral area as well.  Occasional tingling in the left lower extremity with loss of sensation was noted.  Leg muscle spasms were described as extremely painful.  Oral steroids were initiated ;subjectively muscle relaxants did not help. Opiods did provide some pain relief. Dr Bonne DoloresHoss,IR performed epidural Medrol injection  8/6. Dr Venetia MaxonStern Neurosurgery consulted ;he was discharged to SNF with follow-up neurosurgery to be scheduled. He did exhibit mild hyperglycemia with glucoses ranging from 127 up to 130. F/U CBC and BMP were recommended at the SNF. Dr. Fredrich BirksStern's office was to be contacted to schedule surgery.  Past medical and surgical history: Includes  coronary artery disease for which he had a drug-eluting stent placed in 2016.  He has dyslipidemia  & essential hypertension. He states Dr. Channing Muttersoy has performed 3 laminectomies Last llaminectomy was performed in 1995.  He had a shoulder arthroscopy 2008.  Social history: Rare alcohol intake; never smoked  Family history: History reviewed.  No family history of heart attack or stroke.  Sister had leukemia   Review of systems: Intermittent numbness and tingling in the left thigh extending to the calf and foot.  Occasionally this will involve the left testicle as well One episode of watery-loose stools here @ SNF;he denies any recent antibiotic use. He is on the NSAIDs with benefit.  He denies any related GI symptoms.  Constitutional: No fever, significant weight change, fatigue  Eyes: No redness, discharge, pain, vision change ENT/mouth: No nasal congestion, purulent discharge, earache, change in hearing, sore throat  Cardiovascular: No chest pain, palpitations, paroxysmal nocturnal dyspnea, claudication, edema  Respiratory: No cough, sputum production, hemoptysis, DOE, significant snoring, apnea Gastrointestinal: No heartburn, dysphagia, abdominal pain, nausea /vomiting, rectal bleeding, melena Genitourinary: No dysuria, hematuria, pyuria, incontinence, nocturia Musculoskeletal: No joint stiffness, joint swelling, weakness, pain Dermatologic: No rash, pruritus, change in appearance of skin Neurologic: No dizziness, headache, syncope, seizures Psychiatric: No significant anxiety, depression, insomnia, anorexia Endocrine: No change in hair/skin/nails, excessive thirst, excessive hunger, excessive urination  Hematologic/lymphatic: No significant bruising, lymphadenopathy, abnormal bleeding Allergy/immunology: No itchy/watery eyes, significant sneezing, urticaria, angioedema  Physical exam:  Pertinent or positive findings: Unshaven, he has a mustache.  Abdomen is protuberant.  Trace edema at the  sock line.  Range of motion & strength in the lower extremities was not tested to his acute neurologic condition. General appearance:  Adequately nourished; no acute distress, increased work of breathing is present.   Lymphatic: No lymphadenopathy about the head, neck, axilla. Eyes: No conjunctival inflammation or lid edema is present. There is no scleral icterus. Ears:  External ear exam shows no significant lesions or deformities.   Nose:  External nasal examination shows no deformity or inflammation. Nasal mucosa are pink and moist without lesions, exudates Oral exam: Lips and gums are healthy appearing.There is no oropharyngeal erythema or exudate. Neck:  No thyromegaly, masses, tenderness noted.    Heart:  Normal rate and regular rhythm. S1 and S2 normal without gallop, murmur, click, rub.  Lungs: Chest clear to auscultation without wheezes, rhonchi, rales, rubs. Abdomen: Bowel sounds are normal.  Abdomen is soft and nontender with no organomegaly, hernias, masses. GU: Deferred  Extremities:  No cyanosis, clubbing. Neurologic exam:  Balance, Rhomberg, finger to nose testing could not be completed due to clinical state Skin: Warm & dry w/o tenting. No significant lesions or rash.  See clinical summary under each active problem in the Problem List with associated updated therapeutic plan

## 2018-02-12 ENCOUNTER — Other Ambulatory Visit: Payer: Self-pay | Admitting: Neurological Surgery

## 2018-02-13 ENCOUNTER — Other Ambulatory Visit: Payer: Self-pay

## 2018-02-13 ENCOUNTER — Encounter (HOSPITAL_COMMUNITY): Payer: Self-pay | Admitting: *Deleted

## 2018-02-13 NOTE — Progress Notes (Signed)
Spoke with pt and his wife, Jesse Hodges for pre-op call. Pt has hx of 1 stent placed in 2016 by Dr. Donnie Ahoilley. Denies any recent chest pain or sob. Pt states he is not diabetic. Pt will be arriving via ambulance from his home due to severe back pain. Pt's wife will be arriving shortly after.  Have requested last office visit notes and any heart studies done at office.

## 2018-02-14 ENCOUNTER — Ambulatory Visit (HOSPITAL_COMMUNITY): Payer: Federal, State, Local not specified - PPO | Admitting: Certified Registered Nurse Anesthetist

## 2018-02-14 ENCOUNTER — Other Ambulatory Visit: Payer: Self-pay

## 2018-02-14 ENCOUNTER — Encounter (HOSPITAL_COMMUNITY)
Admission: RE | Disposition: A | Discharge status: Home / Self Care | Payer: Self-pay | Source: Ambulatory Visit | Attending: Neurological Surgery

## 2018-02-14 ENCOUNTER — Encounter (HOSPITAL_COMMUNITY): Payer: Self-pay | Admitting: *Deleted

## 2018-02-14 ENCOUNTER — Observation Stay (HOSPITAL_COMMUNITY)
Admission: RE | Admit: 2018-02-14 | Discharge: 2018-02-15 | Disposition: A | Discharge status: Home / Self Care | Payer: Federal, State, Local not specified - PPO | Source: Ambulatory Visit | Attending: Neurological Surgery | Admitting: Neurological Surgery

## 2018-02-14 ENCOUNTER — Ambulatory Visit (HOSPITAL_COMMUNITY): Payer: Federal, State, Local not specified - PPO

## 2018-02-14 DIAGNOSIS — M5416 Radiculopathy, lumbar region: Secondary | ICD-10-CM | POA: Diagnosis present

## 2018-02-14 DIAGNOSIS — Z7982 Long term (current) use of aspirin: Secondary | ICD-10-CM | POA: Diagnosis not present

## 2018-02-14 DIAGNOSIS — I10 Essential (primary) hypertension: Secondary | ICD-10-CM | POA: Diagnosis not present

## 2018-02-14 DIAGNOSIS — E785 Hyperlipidemia, unspecified: Secondary | ICD-10-CM | POA: Diagnosis not present

## 2018-02-14 DIAGNOSIS — Z79899 Other long term (current) drug therapy: Secondary | ICD-10-CM | POA: Diagnosis not present

## 2018-02-14 DIAGNOSIS — M5116 Intervertebral disc disorders with radiculopathy, lumbar region: Secondary | ICD-10-CM | POA: Diagnosis not present

## 2018-02-14 DIAGNOSIS — M47816 Spondylosis without myelopathy or radiculopathy, lumbar region: Secondary | ICD-10-CM | POA: Insufficient documentation

## 2018-02-14 DIAGNOSIS — I2510 Atherosclerotic heart disease of native coronary artery without angina pectoris: Secondary | ICD-10-CM | POA: Insufficient documentation

## 2018-02-14 DIAGNOSIS — Z419 Encounter for procedure for purposes other than remedying health state, unspecified: Secondary | ICD-10-CM

## 2018-02-14 DIAGNOSIS — Z955 Presence of coronary angioplasty implant and graft: Secondary | ICD-10-CM | POA: Insufficient documentation

## 2018-02-14 HISTORY — DX: Adverse effect of unspecified anesthetic, initial encounter: T41.45XA

## 2018-02-14 HISTORY — DX: Other complications of anesthesia, initial encounter: T88.59XA

## 2018-02-14 HISTORY — DX: Essential (primary) hypertension: I10

## 2018-02-14 HISTORY — DX: Headache: R51

## 2018-02-14 HISTORY — DX: Headache, unspecified: R51.9

## 2018-02-14 HISTORY — PX: LUMBAR LAMINECTOMY/DECOMPRESSION MICRODISCECTOMY: SHX5026

## 2018-02-14 LAB — CBC
HEMATOCRIT: 43.1 % (ref 39.0–52.0)
Hemoglobin: 15 g/dL (ref 13.0–17.0)
MCH: 33.2 pg (ref 26.0–34.0)
MCHC: 34.8 g/dL (ref 30.0–36.0)
MCV: 95.4 fL (ref 78.0–100.0)
PLATELETS: 209 10*3/uL (ref 150–400)
RBC: 4.52 MIL/uL (ref 4.22–5.81)
RDW: 11.7 % (ref 11.5–15.5)
WBC: 10 10*3/uL (ref 4.0–10.5)

## 2018-02-14 SURGERY — LUMBAR LAMINECTOMY/DECOMPRESSION MICRODISCECTOMY 1 LEVEL
Anesthesia: General | Site: Back | Laterality: Left

## 2018-02-14 MED ORDER — SENNOSIDES-DOCUSATE SODIUM 8.6-50 MG PO TABS
2.0000 | ORAL_TABLET | Freq: Two times a day (BID) | ORAL | Status: DC
  Administered 2018-02-14 (×2): 2 via ORAL
  Filled 2018-02-14 (×2): qty 2

## 2018-02-14 MED ORDER — ATORVASTATIN CALCIUM 20 MG PO TABS
40.0000 mg | ORAL_TABLET | Freq: Every day | ORAL | Status: DC
  Administered 2018-02-15: 40 mg via ORAL
  Filled 2018-02-14: qty 2

## 2018-02-14 MED ORDER — MIDAZOLAM HCL 2 MG/2ML IJ SOLN
INTRAMUSCULAR | Status: AC
  Filled 2018-02-14: qty 2

## 2018-02-14 MED ORDER — FENTANYL CITRATE (PF) 250 MCG/5ML IJ SOLN
INTRAMUSCULAR | Status: DC | PRN
  Administered 2018-02-14 (×2): 100 ug via INTRAVENOUS
  Administered 2018-02-14 (×6): 50 ug via INTRAVENOUS

## 2018-02-14 MED ORDER — SODIUM CHLORIDE 0.9 % IV SOLN: 250.0000 mL | INTRAVENOUS | Status: DC

## 2018-02-14 MED ORDER — THROMBIN 5000 UNITS EX SOLR
CUTANEOUS | Status: DC | PRN
  Administered 2018-02-14 (×2): 5000 [IU] via TOPICAL

## 2018-02-14 MED ORDER — PANTOPRAZOLE SODIUM 40 MG PO TBEC
40.0000 mg | DELAYED_RELEASE_TABLET | Freq: Every day | ORAL | Status: DC
  Administered 2018-02-14 – 2018-02-15 (×2): 40 mg via ORAL
  Filled 2018-02-14 (×2): qty 1

## 2018-02-14 MED ORDER — FENTANYL CITRATE (PF) 250 MCG/5ML IJ SOLN
INTRAMUSCULAR | Status: AC
  Filled 2018-02-14: qty 5

## 2018-02-14 MED ORDER — TRAMADOL HCL 50 MG PO TABS: 50.0000 mg | ORAL_TABLET | Freq: Four times a day (QID) | ORAL | 1 refills | Status: DC | PRN

## 2018-02-14 MED ORDER — PHENYLEPHRINE 40 MCG/ML (10ML) SYRINGE FOR IV PUSH (FOR BLOOD PRESSURE SUPPORT)
PREFILLED_SYRINGE | INTRAVENOUS | Status: AC
  Filled 2018-02-14: qty 10

## 2018-02-14 MED ORDER — KETOROLAC TROMETHAMINE 30 MG/ML IJ SOLN
INTRAMUSCULAR | Status: DC | PRN
  Administered 2018-02-14: 30 mg via INTRAVENOUS

## 2018-02-14 MED ORDER — SENNA 8.6 MG PO TABS: 1.0000 | ORAL_TABLET | Freq: Two times a day (BID) | ORAL | Status: DC

## 2018-02-14 MED ORDER — HYDROCHLOROTHIAZIDE 12.5 MG PO CAPS
12.5000 mg | ORAL_CAPSULE | Freq: Every day | ORAL | Status: DC
  Administered 2018-02-14: 12.5 mg via ORAL
  Filled 2018-02-14: qty 1

## 2018-02-14 MED ORDER — DOCUSATE SODIUM 100 MG PO CAPS
100.0000 mg | ORAL_CAPSULE | Freq: Two times a day (BID) | ORAL | Status: DC
  Administered 2018-02-14 (×2): 100 mg via ORAL
  Filled 2018-02-14 (×2): qty 1

## 2018-02-14 MED ORDER — ACETAMINOPHEN 325 MG PO TABS: 650.0000 mg | ORAL_TABLET | ORAL | Status: DC | PRN

## 2018-02-14 MED ORDER — ALUM & MAG HYDROXIDE-SIMETH 200-200-20 MG/5ML PO SUSP: 30.0000 mL | Freq: Four times a day (QID) | ORAL | Status: DC | PRN

## 2018-02-14 MED ORDER — HEMOSTATIC AGENTS (NO CHARGE) OPTIME
TOPICAL | Status: DC | PRN
  Administered 2018-02-14 (×2): 1 via TOPICAL

## 2018-02-14 MED ORDER — KETOROLAC TROMETHAMINE 30 MG/ML IJ SOLN
INTRAMUSCULAR | Status: AC
  Filled 2018-02-14: qty 1

## 2018-02-14 MED ORDER — DEXAMETHASONE SODIUM PHOSPHATE 10 MG/ML IJ SOLN
INTRAMUSCULAR | Status: DC | PRN
  Administered 2018-02-14: 10 mg via INTRAVENOUS

## 2018-02-14 MED ORDER — LOSARTAN POTASSIUM 50 MG PO TABS
50.0000 mg | ORAL_TABLET | Freq: Every day | ORAL | Status: DC
  Administered 2018-02-14: 50 mg via ORAL
  Filled 2018-02-14: qty 1

## 2018-02-14 MED ORDER — BUPIVACAINE HCL (PF) 0.5 % IJ SOLN
INTRAMUSCULAR | Status: DC | PRN
  Administered 2018-02-14: 20 mL
  Administered 2018-02-14: 5 mL

## 2018-02-14 MED ORDER — SUGAMMADEX SODIUM 200 MG/2ML IV SOLN
INTRAVENOUS | Status: DC | PRN
  Administered 2018-02-14: 200 mg via INTRAVENOUS

## 2018-02-14 MED ORDER — PHENYLEPHRINE HCL 10 MG/ML IJ SOLN
INTRAMUSCULAR | Status: DC | PRN
  Administered 2018-02-14 (×3): 80 ug via INTRAVENOUS

## 2018-02-14 MED ORDER — POLYETHYLENE GLYCOL 3350 17 G PO PACK: 17.0000 g | PACK | Freq: Every day | ORAL | Status: DC | PRN

## 2018-02-14 MED ORDER — ACETAMINOPHEN 650 MG RE SUPP: 650.0000 mg | RECTAL | Status: DC | PRN

## 2018-02-14 MED ORDER — ONDANSETRON HCL 4 MG PO TABS: 4.0000 mg | ORAL_TABLET | Freq: Four times a day (QID) | ORAL | Status: DC | PRN

## 2018-02-14 MED ORDER — PROPOFOL 10 MG/ML IV BOLUS
INTRAVENOUS | Status: DC | PRN
  Administered 2018-02-14: 160 mg via INTRAVENOUS

## 2018-02-14 MED ORDER — PHENOL 1.4 % MT LIQD: 1.0000 | OROMUCOSAL | Status: DC | PRN

## 2018-02-14 MED ORDER — ROCURONIUM BROMIDE 50 MG/5ML IV SOSY
PREFILLED_SYRINGE | INTRAVENOUS | Status: AC
  Filled 2018-02-14: qty 10

## 2018-02-14 MED ORDER — BISACODYL 10 MG RE SUPP: 10.0000 mg | Freq: Every day | RECTAL | Status: DC | PRN

## 2018-02-14 MED ORDER — THROMBIN 5000 UNITS EX SOLR
CUTANEOUS | Status: AC
  Filled 2018-02-14: qty 10000

## 2018-02-14 MED ORDER — ONDANSETRON HCL 4 MG/2ML IJ SOLN
INTRAMUSCULAR | Status: DC | PRN
  Administered 2018-02-14: 4 mg via INTRAVENOUS

## 2018-02-14 MED ORDER — OXYCODONE-ACETAMINOPHEN 5-325 MG PO TABS: 1.0000 | ORAL_TABLET | ORAL | Status: DC | PRN

## 2018-02-14 MED ORDER — MORPHINE SULFATE (PF) 2 MG/ML IV SOLN: 2.0000 mg | INTRAVENOUS | Status: DC | PRN

## 2018-02-14 MED ORDER — LIDOCAINE-EPINEPHRINE 1 %-1:100000 IJ SOLN
INTRAMUSCULAR | Status: AC
  Filled 2018-02-14: qty 1

## 2018-02-14 MED ORDER — ROCURONIUM BROMIDE 10 MG/ML (PF) SYRINGE
PREFILLED_SYRINGE | INTRAVENOUS | Status: DC | PRN
  Administered 2018-02-14: 50 mg via INTRAVENOUS
  Administered 2018-02-14: 10 mg via INTRAVENOUS

## 2018-02-14 MED ORDER — NITROGLYCERIN 0.4 MG SL SUBL: 0.4000 mg | SUBLINGUAL_TABLET | SUBLINGUAL | Status: DC | PRN

## 2018-02-14 MED ORDER — LIDOCAINE 2% (20 MG/ML) 5 ML SYRINGE
INTRAMUSCULAR | Status: DC | PRN
  Administered 2018-02-14: 100 mg via INTRAVENOUS

## 2018-02-14 MED ORDER — LIDOCAINE 2% (20 MG/ML) 5 ML SYRINGE
INTRAMUSCULAR | Status: AC
  Filled 2018-02-14: qty 10

## 2018-02-14 MED ORDER — METHOCARBAMOL 1000 MG/10ML IJ SOLN
500.0000 mg | Freq: Four times a day (QID) | INTRAVENOUS | Status: DC | PRN
  Filled 2018-02-14: qty 5

## 2018-02-14 MED ORDER — LIDOCAINE-EPINEPHRINE 1 %-1:100000 IJ SOLN
INTRAMUSCULAR | Status: DC | PRN
  Administered 2018-02-14: 5 mL

## 2018-02-14 MED ORDER — GABAPENTIN 100 MG PO CAPS
100.0000 mg | ORAL_CAPSULE | Freq: Three times a day (TID) | ORAL | Status: DC
  Administered 2018-02-14 – 2018-02-15 (×4): 100 mg via ORAL
  Filled 2018-02-14 (×4): qty 1

## 2018-02-14 MED ORDER — CEFAZOLIN SODIUM-DEXTROSE 2-3 GM-%(50ML) IV SOLR
INTRAVENOUS | Status: DC | PRN
  Administered 2018-02-14: 2 g via INTRAVENOUS

## 2018-02-14 MED ORDER — MENTHOL 3 MG MT LOZG: 1.0000 | LOZENGE | OROMUCOSAL | Status: DC | PRN

## 2018-02-14 MED ORDER — SODIUM CHLORIDE 0.9% FLUSH: 3.0000 mL | INTRAVENOUS | Status: DC | PRN

## 2018-02-14 MED ORDER — ONDANSETRON HCL 4 MG/2ML IJ SOLN: 4.0000 mg | Freq: Four times a day (QID) | INTRAMUSCULAR | Status: DC | PRN

## 2018-02-14 MED ORDER — DEXAMETHASONE SODIUM PHOSPHATE 10 MG/ML IJ SOLN
INTRAMUSCULAR | Status: AC
  Filled 2018-02-14: qty 1

## 2018-02-14 MED ORDER — CYCLOBENZAPRINE HCL 5 MG PO TABS
5.0000 mg | ORAL_TABLET | Freq: Three times a day (TID) | ORAL | Status: DC
  Administered 2018-02-14 – 2018-02-15 (×4): 5 mg via ORAL
  Filled 2018-02-14 (×4): qty 1

## 2018-02-14 MED ORDER — BUPIVACAINE HCL (PF) 0.5 % IJ SOLN
INTRAMUSCULAR | Status: AC
  Filled 2018-02-14: qty 30

## 2018-02-14 MED ORDER — PROPOFOL 10 MG/ML IV BOLUS
INTRAVENOUS | Status: AC
  Filled 2018-02-14: qty 20

## 2018-02-14 MED ORDER — METHOCARBAMOL 500 MG PO TABS: 500.0000 mg | ORAL_TABLET | Freq: Four times a day (QID) | ORAL | Status: DC | PRN

## 2018-02-14 MED ORDER — MIDAZOLAM HCL 2 MG/2ML IJ SOLN
INTRAMUSCULAR | Status: DC | PRN
  Administered 2018-02-14: 2 mg via INTRAVENOUS

## 2018-02-14 MED ORDER — ONDANSETRON HCL 4 MG/2ML IJ SOLN
INTRAMUSCULAR | Status: AC
  Filled 2018-02-14: qty 2

## 2018-02-14 MED ORDER — SODIUM CHLORIDE 0.9% FLUSH
3.0000 mL | Freq: Two times a day (BID) | INTRAVENOUS | Status: DC
  Administered 2018-02-14: 3 mL via INTRAVENOUS

## 2018-02-14 MED ORDER — SODIUM CHLORIDE 0.9 % IV SOLN
INTRAVENOUS | Status: DC | PRN
  Administered 2018-02-14: 500 mL

## 2018-02-14 MED ORDER — TRAMADOL HCL 50 MG PO TABS
50.0000 mg | ORAL_TABLET | Freq: Four times a day (QID) | ORAL | Status: DC | PRN
  Administered 2018-02-15: 50 mg via ORAL
  Filled 2018-02-14 (×2): qty 1

## 2018-02-14 MED ORDER — OXYCODONE HCL 5 MG PO TABS: 5.0000 mg | ORAL_TABLET | Freq: Four times a day (QID) | ORAL | Status: DC | PRN

## 2018-02-14 MED ORDER — METHOCARBAMOL 500 MG PO TABS: 500.0000 mg | ORAL_TABLET | Freq: Four times a day (QID) | ORAL | 1 refills | Status: DC | PRN

## 2018-02-14 MED ORDER — 0.9 % SODIUM CHLORIDE (POUR BTL) OPTIME
TOPICAL | Status: DC | PRN
  Administered 2018-02-14: 1000 mL

## 2018-02-14 MED ORDER — ROCURONIUM BROMIDE 50 MG/5ML IV SOSY
PREFILLED_SYRINGE | INTRAVENOUS | Status: AC
  Filled 2018-02-14: qty 5

## 2018-02-14 MED ORDER — CEFAZOLIN SODIUM-DEXTROSE 2-4 GM/100ML-% IV SOLN
2.0000 g | Freq: Three times a day (TID) | INTRAVENOUS | Status: AC
  Administered 2018-02-14 (×2): 2 g via INTRAVENOUS
  Filled 2018-02-14 (×2): qty 100

## 2018-02-14 MED ORDER — FLEET ENEMA 7-19 GM/118ML RE ENEM: 1.0000 | ENEMA | Freq: Once | RECTAL | Status: DC | PRN

## 2018-02-14 MED ORDER — LACTATED RINGERS IV SOLN
INTRAVENOUS | Status: DC | PRN
Start: 2018-02-14 — End: 2018-02-14
  Administered 2018-02-14: 07:00:00 via INTRAVENOUS

## 2018-02-14 SURGICAL SUPPLY — 50 items
BAG DECANTER FOR FLEXI CONT (MISCELLANEOUS) ×3 IMPLANT
BLADE CLIPPER SURG (BLADE) IMPLANT
BUR ACORN 6.0 (BURR) IMPLANT
BUR ACORN 6.0MM (BURR)
BUR MATCHSTICK NEURO 3.0 LAGG (BURR) ×3 IMPLANT
CANISTER SUCT 3000ML PPV (MISCELLANEOUS) ×3 IMPLANT
DECANTER SPIKE VIAL GLASS SM (MISCELLANEOUS) ×3 IMPLANT
DERMABOND ADVANCED (GAUZE/BANDAGES/DRESSINGS) ×2
DERMABOND ADVANCED .7 DNX12 (GAUZE/BANDAGES/DRESSINGS) ×1 IMPLANT
DEVICE DISSECT PLASMABLAD 3.0S (MISCELLANEOUS) ×1 IMPLANT
DRAPE HALF SHEET 40X57 (DRAPES) IMPLANT
DRAPE LAPAROTOMY T 102X78X121 (DRAPES) ×3 IMPLANT
DRAPE MICROSCOPE LEICA (MISCELLANEOUS) ×3 IMPLANT
DRSG OPSITE POSTOP 4X6 (GAUZE/BANDAGES/DRESSINGS) ×3 IMPLANT
DURAPREP 26ML APPLICATOR (WOUND CARE) ×3 IMPLANT
ELECT REM PT RETURN 9FT ADLT (ELECTROSURGICAL) ×3
ELECTRODE REM PT RTRN 9FT ADLT (ELECTROSURGICAL) ×1 IMPLANT
GAUZE 4X4 16PLY RFD (DISPOSABLE) IMPLANT
GAUZE SPONGE 4X4 12PLY STRL (GAUZE/BANDAGES/DRESSINGS) IMPLANT
GLOVE BIOGEL PI IND STRL 7.0 (GLOVE) ×1 IMPLANT
GLOVE BIOGEL PI IND STRL 8.5 (GLOVE) ×1 IMPLANT
GLOVE BIOGEL PI INDICATOR 7.0 (GLOVE) ×2
GLOVE BIOGEL PI INDICATOR 8.5 (GLOVE) ×2
GLOVE ECLIPSE 8.5 STRL (GLOVE) ×6 IMPLANT
GLOVE SURG SS PI 7.0 STRL IVOR (GLOVE) ×6 IMPLANT
GLOVE SURG SS PI 7.5 STRL IVOR (GLOVE) ×12 IMPLANT
GOWN STRL REUS W/ TWL LRG LVL3 (GOWN DISPOSABLE) ×3 IMPLANT
GOWN STRL REUS W/ TWL XL LVL3 (GOWN DISPOSABLE) IMPLANT
GOWN STRL REUS W/TWL 2XL LVL3 (GOWN DISPOSABLE) ×3 IMPLANT
GOWN STRL REUS W/TWL LRG LVL3 (GOWN DISPOSABLE) ×6
GOWN STRL REUS W/TWL XL LVL3 (GOWN DISPOSABLE)
KIT BASIN OR (CUSTOM PROCEDURE TRAY) ×3 IMPLANT
KIT TURNOVER KIT B (KITS) ×3 IMPLANT
NEEDLE HYPO 22GX1.5 SAFETY (NEEDLE) ×3 IMPLANT
NEEDLE SPNL 20GX3.5 QUINCKE YW (NEEDLE) ×3 IMPLANT
NS IRRIG 1000ML POUR BTL (IV SOLUTION) ×3 IMPLANT
PACK LAMINECTOMY NEURO (CUSTOM PROCEDURE TRAY) ×3 IMPLANT
PAD ARMBOARD 7.5X6 YLW CONV (MISCELLANEOUS) ×9 IMPLANT
PATTIES SURGICAL .5 X1 (DISPOSABLE) ×3 IMPLANT
PLASMABLADE 3.0S (MISCELLANEOUS) ×3
RUBBERBAND STERILE (MISCELLANEOUS) ×6 IMPLANT
SPONGE SURGIFOAM ABS GEL SZ50 (HEMOSTASIS) ×3 IMPLANT
SURGIFLO W/THROMBIN 8M KIT (HEMOSTASIS) ×3 IMPLANT
SUT VIC AB 1 CT1 18XBRD ANBCTR (SUTURE) ×1 IMPLANT
SUT VIC AB 1 CT1 8-18 (SUTURE) ×2
SUT VIC AB 2-0 CP2 18 (SUTURE) ×3 IMPLANT
SUT VIC AB 3-0 SH 8-18 (SUTURE) ×3 IMPLANT
TOWEL GREEN STERILE (TOWEL DISPOSABLE) ×3 IMPLANT
TOWEL GREEN STERILE FF (TOWEL DISPOSABLE) ×3 IMPLANT
WATER STERILE IRR 1000ML POUR (IV SOLUTION) ×3 IMPLANT

## 2018-02-14 NOTE — H&P (Signed)
Jesse Hodges is an 67 y.o. male.   Chief Complaint: Back and left leg pain HPI: Jesse Hodges is a 67 year old individual whose had severe and unrelenting back and left leg pain.  He has had previous laminectomies and discectomies and notes for total back operations.  He notes that the onset of this pain was severe and excruciating and since that time he is not been able to ambulate at all.  He was hospitalized last week and evaluated by Dr. Venetia MaxonStern who suggested a transforaminal block at L3-4.  He notes that this was unsuccessful and was subsequently [facility] where he discharged himself after a few days as he was receiving no help.  He presented to the office and I advised that ultimately he may need with Dr. Venetia MaxonStern recommended a two-level decompression but given the acuity and severity of his symptoms I believe that a simple decompression of a small foraminal disc protrusion at L3-4 may have him substantial relief.  He is now admitted for that surgery.  Past Medical History:  Diagnosis Date  . Complication of anesthesia    wife states pt had a massive drop in temperature after one of his back surgeries  . Coronary artery disease 2016   1 stent  . DDD (degenerative disc disease), lumbar   . Dyslipidemia 07/16/2014  . Headache    migraines as a child  . Hypertension   . Low back pain     Past Surgical History:  Procedure Laterality Date  . BACK SURGERY     x 4 lumbar region  . CORONARY ANGIOPLASTY WITH STENT PLACEMENT  07/17/2014   "1"  . IR EPIDUROGRAPHY  02/05/2018  . LEFT HEART CATHETERIZATION WITH CORONARY ANGIOGRAM N/A 07/17/2014   Procedure: LEFT HEART CATHETERIZATION WITH CORONARY ANGIOGRAM;  Surgeon: Othella BoyerWilliam S Tilley, MD;  [facility];  Service: Cardiovascular;  Laterality: N/A;  . LUMBAR LAMINECTOMY  ~ 1995; ~ 2000; ~ 2002; ~ 2004  . SHOULDER ARTHROSCOPY Right 02/2007   adhesion release    Family History  Problem Relation Age of Onset  .  Hypertension Father    Social History:  reports that he has never smoked. He has never used smokeless tobacco. He reports that he drinks alcohol. He reports that he does not use drugs.  Allergies: No Known Allergies  Medications Prior to Admission  Medication Sig Dispense Refill  . aspirin EC 81 MG tablet Take 81 mg by mouth daily.    Marland Kitchen. atorvastatin (LIPITOR) 40 MG tablet Take 1 tablet (40 mg total) by mouth daily. 30 tablet 12  . cyclobenzaprine (FLEXERIL) 5 MG tablet Take 1 tablet (5 mg total) by mouth 3 (three) times daily. 30 tablet 0  . gabapentin (NEURONTIN) 100 MG capsule Take 1 capsule (100 mg total) by mouth 3 (three) times daily. 90 capsule 0  . hydrochlorothiazide (MICROZIDE) 12.5 MG capsule Take 12.5 mg by mouth daily. Take with losartan    . ibuprofen (ADVIL,MOTRIN) 200 MG tablet Take 400 mg by mouth every 4 (four) hours as needed for moderate pain.    Marland Kitchen. lidocaine (LIDODERM) 5 % Place 1 patch onto the skin daily. Remove & Discard patch within 12 hours or as directed by MD 30 patch 0  . losartan (COZAAR) 50 MG tablet Take 50 mg by mouth daily. Take with hctz    . pantoprazole (PROTONIX) 40 MG tablet Take 1 tablet (40 mg total) by mouth daily. 30 tablet 0  . senna-docusate (SENOKOT-S)  8.6-50 MG tablet Take 2 tablets by mouth 2 (two) times daily. 60 tablet 0  . traMADol (ULTRAM) 50 MG tablet Take by mouth every 6 (six) hours as needed.    Marland Kitchen. NITROSTAT 0.4 MG SL tablet Place 0.4 mg under the tongue every 5 (five) minutes as needed for chest pain.   0  . oxyCODONE (ROXICODONE) 5 MG immediate release tablet Take 1 tablet (5 mg total) by mouth every 6 (six) hours as needed for breakthrough pain. 5 tablet 0    Results for orders placed or performed during the hospital encounter of 02/14/18 (from the past 48 hour(s))  CBC     Status: None   Collection Time: 02/14/18  5:37 AM  Result Value Ref Range   WBC 10.0 4.0 - 10.5 K/uL   RBC 4.52 4.22 - 5.81 MIL/uL   Hemoglobin 15.0 13.0 - 17.0  g/dL   HCT 91.443.1 78.239.0 - 95.652.0 %   MCV 95.4 78.0 - 100.0 fL   MCH 33.2 26.0 - 34.0 pg   MCHC 34.8 30.0 - 36.0 g/dL   RDW 21.311.7 08.611.5 - 57.815.5 %   Platelets 209 150 - 400 K/uL    Comment: Performed at [facility], [address] [place], [place] [address]   No results found.  Review of Systems  Constitutional: Positive for malaise/fatigue.  HENT: Negative.   Eyes: Negative.   Respiratory: Negative.   Cardiovascular: Negative.   Gastrointestinal: Negative.   Genitourinary: Negative.   Musculoskeletal: Positive for back pain.  Skin: Negative.   Neurological:       Leg weakness such that he cannot bear weight on his left lower extremity he cannot outstretch his left leg even while lying supine or on his side  Endo/Heme/Allergies: Negative.   Psychiatric/Behavioral: Negative.     Blood pressure 131/80, pulse 88, temperature 98.2 F (36.8 C), resp. rate (!) 80, height 6' (1.829 m), weight 91.6 kg, SpO2 98 %. Physical Exam  Constitutional: He appears well-developed and well-nourished.  HENT:  Head: Atraumatic.  Eyes: Pupils are equal, round, and reactive to light. Conjunctivae and EOM are normal.  Neck: Normal range of motion. Neck supple.  GI: Soft. Bowel sounds are normal.  Musculoskeletal:  Patrick's maneuver is negative bilaterally however the patient cannot straighten out his left lower extremity.  Neurological:  Alert and oriented motor strength reveals 4 out of 5 strength in iliopsoas in the quadricep on the left he cannot lie with the left leg outstretched Patrick's maneuver is negative bilaterally.  Absent reflexes in the patellae and the Achilles both.  Cranial nerve examination is normal upper extremity strength and reflexes are normal  Skin: Skin is warm and dry.  Psychiatric: He has a normal mood and affect. His behavior is normal. Judgment and thought content normal.     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  Microdiscectomy L3-4 left  Stefani DamaELSNER,Dabria Wadas J, MD 02/14/2018, 7:42 AM

## 2018-02-14 NOTE — Anesthesia Procedure Notes (Signed)
Procedure Name: Intubation Date/Time: 02/14/2018 7:49 AM Performed by: White, Amedeo Plenty, CRNA Pre-anesthesia Checklist: Patient identified, Emergency Drugs available, Suction available and Patient being monitored Patient Re-evaluated:Patient Re-evaluated prior to induction Oxygen Delivery Method: Circle System Utilized Preoxygenation: Pre-oxygenation with 100% oxygen Induction Type: IV induction Ventilation: Mask ventilation without difficulty Laryngoscope Size: Mac and 4 Grade View: Grade II Tube type: Oral Tube size: 7.5 mm Number of attempts: 1 Airway Equipment and Method: Stylet and Oral airway Placement Confirmation: ETT inserted through vocal cords under direct vision,  positive ETCO2 and breath sounds checked- equal and bilateral Tube secured with: Tape Dental Injury: Teeth and Oropharynx as per pre-operative assessment

## 2018-02-14 NOTE — Evaluation (Signed)
Physical Therapy Evaluation Patient Details Name: Jesse Hodges MRN: [id number] DOB: 09/27/1950 Today's Date: 02/14/2018   History of Present Illness  Jesse Hodges is a 67 y.o s/p L3-L4 Laminotomy foraminotomies and discectomy. PMH includes CAD + Stent, Degenerative Disc Disease, HTN, Dyslipiedimia, Chronic LBP, and radiating L Leg pain.  Clinical Impression  Pt presents with problems above and deficits below. Pt and wife educated on back precautions. Pt educated on generalized walking program. Pt performed bed mobility with Supervision. Pt performed sit<>stand transfers with RW and MinA. Pt ambulated with RW and MinG. Pt is limited by pain and weakness, but expect to progress well. Will continue to follow acutely to support independence, mobility, and safety.     Follow Up Recommendations Home health PT;Supervision for mobility/OOB    Equipment Recommendations  Rolling walker with 5" wheels;3in1 (PT)    Recommendations for Other Services       Precautions / Restrictions Precautions Precautions: Back;Fall Precaution Booklet Issued: Yes (comment) Precaution Comments: Pt educated on precautions and generalized walking program Restrictions Weight Bearing Restrictions: No      Mobility  Bed Mobility Overal bed mobility: Needs Assistance Bed Mobility: Rolling;Sidelying to Sit;Sit to Sidelying Rolling: Supervision Sidelying to sit: Supervision     Sit to sidelying: Supervision General bed mobility comments: Pt was educated on log-roll technique, and required cues for sequencing. Pt required no physical assist.   Transfers Overall transfer level: Needs assistance Equipment used: Rolling walker (2 wheeled) Transfers: Sit to/from Stand Sit to Stand: Min assist         General transfer comment: Pt required MinA and RW to perform sit<>stand transfers. Pt had difficulty with initial power up, and transferred with flexed posture.   Ambulation/Gait Ambulation/Gait assistance: Min  guard Gait Distance (Feet): 60 Feet Assistive device: Rolling walker (2 wheeled) Gait Pattern/deviations: WFL(Within Functional Limits);Antalgic;Trunk flexed;Narrow base of support Gait velocity: Decreased   General Gait Details: Pt ambulated with RW and MinG. Pt was guarded throughout gait with a step-to pattern, and relied on UE support for balance. Pt had a narrow base of support and grimaced during gait. Pt had no SOB and reported no fatigue with ambulation.   Stairs            Wheelchair Mobility    Modified Rankin (Stroke Patients Only)       Balance Overall balance assessment: Needs assistance Sitting-balance support: No upper extremity supported;Feet supported Sitting balance-Leahy Scale: Fair Sitting balance - Comments: Pt was able to scoot hips to EOB   Standing balance support: Bilateral upper extremity supported Standing balance-Leahy Scale: Poor Standing balance comment: Pt reliant on RW for balance, but was able to perform standing marches                             Pertinent Vitals/Pain Pain Assessment: Faces Faces Pain Scale: Hurts a little bit Pain Location: L hip, back Pain Descriptors / Indicators: Grimacing;Guarding Pain Intervention(s): Limited activity within patient's tolerance;Monitored during session;Repositioned;RN gave pain meds during session    Home Living Family/patient expects to be discharged to:: Private residence Living Arrangements: Spouse/significant other Available Help at Discharge: Family;Available PRN/intermittently Type of Home: House Home Access: Stairs to enter Entrance Stairs-Rails: Can reach both Entrance Stairs-Number of Steps: 4 Home Layout: Multi-level Home Equipment: None      Prior Function Level of Independence: Independent         Comments: Pt reports being fairly active and walking around  prior to surgery     Hand Dominance   Dominant Hand: Right    Extremity/Trunk Assessment   Upper  Extremity Assessment Upper Extremity Assessment: Overall WFL for tasks assessed    Lower Extremity Assessment Lower Extremity Assessment: LLE deficits/detail LLE Deficits / Details: Pt reported some tingling in toes, and increased coordination in LE. Pt reported some L hip pain with sitting at EOB, but that it improved with ambulation    Cervical / Trunk Assessment Cervical / Trunk Assessment: Other exceptions(s/p Laminotomy foraminotomies and discectomy L3-4)  Communication   Communication: No difficulties  Cognition Arousal/Alertness: Awake/alert Behavior During Therapy: WFL for tasks assessed/performed Overall Cognitive Status: Within Functional Limits for tasks assessed                                 General Comments: Pt is pleasant      General Comments      Exercises     Assessment/Plan    PT Assessment Patient needs continued PT services  PT Problem List Decreased strength;Decreased range of motion;Decreased activity tolerance;Decreased balance;Decreased mobility;Decreased knowledge of use of DME;Decreased knowledge of precautions;Pain       PT Treatment Interventions DME instruction;Gait training;Stair training;Functional mobility training;Therapeutic activities;Therapeutic exercise;Balance training;Patient/family education    PT Goals (Current goals can be found in the Care Plan section)  Acute Rehab PT Goals Patient Stated Goal: To walk PT Goal Formulation: With patient Time For Goal Achievement: 02/28/18 Potential to Achieve Goals: Good    Frequency Min 5X/week   Barriers to discharge        Co-evaluation               AM-PAC PT "6 Clicks" Daily Activity  Outcome Measure Difficulty turning over in bed (including adjusting bedclothes, sheets and blankets)?: A Little Difficulty moving from lying on back to sitting on the side of the bed? : A Little Difficulty sitting down on and standing up from a chair with arms (e.g., wheelchair,  bedside commode, etc,.)?: Unable Help needed moving to and from a bed to chair (including a wheelchair)?: A Little Help needed walking in hospital room?: A Little Help needed climbing 3-5 steps with a railing? : A Lot 6 Click Score: 15    End of Session Equipment Utilized During Treatment: Gait belt Activity Tolerance: Patient limited by pain Patient left: with call bell/phone within reach;in bed;with family/visitor present Nurse Communication: Mobility status PT Visit Diagnosis: Other abnormalities of gait and mobility (R26.89);Muscle weakness (generalized) (M62.81);Other symptoms and signs involving the nervous system (R29.898);Pain;Unsteadiness on feet (R26.81);Difficulty in walking, not elsewhere classified (R26.2) Pain - Right/Left: Left Pain - part of body: Leg;Hip(Back)    Time: [id number] PT Time Calculation (min) (ACUTE ONLY): 30 min   Charges:   PT Evaluation $PT Eval Low Complexity: 1 Low PT Treatments $Gait Training: 8-22 mins      Demetria PoreJulia Ha Shannahan, S-DPT Acute Care Rehab Student [phone]   02/14/2018, 5:12 PM

## 2018-02-14 NOTE — Discharge Instructions (Signed)

## 2018-02-14 NOTE — Anesthesia Preprocedure Evaluation (Signed)
Anesthesia Evaluation  Patient identified by MRN, date of birth, ID band Patient awake    Reviewed: Allergy & Precautions, NPO status , Patient's Chart, lab work & pertinent test results  Airway Mallampati: I  TM Distance: >3 FB Neck ROM: Full    Dental   Pulmonary    Pulmonary exam normal        Cardiovascular hypertension, Pt. on medications Normal cardiovascular exam     Neuro/Psych    GI/Hepatic   Endo/Other    Renal/GU      Musculoskeletal   Abdominal   Peds  Hematology   Anesthesia Other Findings   Reproductive/Obstetrics                             Anesthesia Physical Anesthesia Plan  ASA: II  Anesthesia Plan: General   Post-op Pain Management:    Induction: Intravenous  PONV Risk Score and Plan: 2 and Ondansetron and Treatment may vary due to age or medical condition  Airway Management Planned: Oral ETT  Additional Equipment:   Intra-op Plan:   Post-operative Plan: Extubation in OR  Informed Consent: I have reviewed the patients History and Physical, chart, labs and discussed the procedure including the risks, benefits and alternatives for the proposed anesthesia with the patient or authorized representative who has indicated his/her understanding and acceptance.     Plan Discussed with: CRNA and Surgeon  Anesthesia Plan Comments:         Anesthesia Quick Evaluation

## 2018-02-14 NOTE — Progress Notes (Signed)
Patient ID: Jesse Hodges, male   DOB: 04/09/1951, 67 y.o.   MRN: [id number] Vital signs are stable Patient notes that leg pain is much improved He has ambulated down the hallway today Back pain is very tolerable at this point He does not feel he needs any meds for discharge Has tramadol at home I will give additional prescription for that and Robaxin Plan discharge in the morning

## 2018-02-14 NOTE — Transfer of Care (Signed)
Immediate Anesthesia Transfer of Care Note  Patient: Phylliss Bobhomas M Kalla  Procedure(s) Performed: Left Lumbar three-four Microdiscectomy (Left Back)  Patient Location: PACU  Anesthesia Type:General  Level of Consciousness: awake, alert  and patient cooperative  Airway & Oxygen Therapy: Patient Spontanous Breathing  Post-op Assessment: Report given to RN and Post -op Vital signs reviewed and stable  Post vital signs: Reviewed and stable  Last Vitals:  Vitals Value Taken Time  BP 139/72 02/14/2018  9:54 AM  Temp    Pulse 108 02/14/2018  9:56 AM  Resp 24 02/14/2018  9:56 AM  SpO2 95 % 02/14/2018  9:56 AM  Vitals shown include unvalidated device data.  Last Pain:  Vitals:   02/14/18 0554  PainSc: 5       Patients Stated Pain Goal: 3 (02/14/18 0554)  Complications: No apparent anesthesia complications

## 2018-02-14 NOTE — Op Note (Signed)
Date of surgery: 02/14/2018 Preoperative diagnosis: Left lumbar radiculopathy L3-4 with spondylosis, herniated nucleus pulposus L3-L4, previous surgery Postoperative diagnosis: Same Procedure: Laminotomy foraminotomies and discectomy L3-4 left with operating microscope microdissection technique Surgeon: Alpha GulaHenry Hodges First assistant: Lisbeth RenshawNeelesh Nundkumar MD Anesthesia: General endotracheal Indications: Jesse Ivoryhomas Kreiter is a 67 year old individual who has been bedridden for the last 2 weeks secondary to a severe left lumbar radiculopathy in the L3-4 distribution.  He has evidence of previous surgery in this region and has significant spondylosis with lateral recess stenosis and a suspicion of recurrent herniated nucleus pulposus at L3-4 on the left.  Is been advised regarding surgery.  Procedure: Patient was brought to the operating room supine on a stretcher after the smooth induction of general endotracheal anesthesia he was carefully turned prone.  The back was prepped with alcohol DuraPrep and draped in a sterile fashion.  Midline incision was created over the L3-4 region dissection was carried down to the left side of the paraspinous musculature.  A subperiosteal dissection was created at L3-L4 on the left side and a self-retaining retractor was placed in the wound.  Laminotomy was then created by removing the inferior marginal lamina of L3 L2 and including the entirety of the facet.  A laminotomy at L4 was also created to expose the exit path of the L4 nerve root inferiorly.  Then by dissecting carefully in this region significant scar tissue was encountered lateral to the disc space itself this was carefully dissected away from the dura and away from the path of the L4 nerve root ultimately the L4 nerve root could be freed and mobilized medially.  In this region there is significant bony spurring from the superior margin of the body of L4 but no disc fragment was identified the bone spur was ground down  smooth to free access for the past the L4 nerve root into the foramen.  The disc space was then explored and the disc space was easily entered and several significant fragments of markedly degenerated disc material were encountered and removed.  Ultimately the path of the common dural tube along the L4 nerve root both superior and inferior to it was well decompressed.  Hemostasis was obtained in this region.  After careful inspection by both myself and Dr. Conchita ParisNundkumar it felt that a good decompression had been achieved.  No spinal fluid leaks were noted.  At this point the retractor was removed the lumbodorsal fascia was closed with #1 Vicryl in interrupted fashion 2-0 Vicryl was used in the subcutaneous tissues and 3-0 Vicryl subcuticularly.  20 cc of half percent Marcaine was injected into the paraspinous fascia and musculature.  Dermabond was placed on the skin.  Patient tolerated procedure well blood loss was estimated at 100 cc or less.

## 2018-02-14 NOTE — Plan of Care (Signed)

## 2018-02-14 NOTE — Discharge Summary (Signed)
Physician Discharge Summary  Patient ID: Jesse Hodges MRN: [id number] DOB/AGE: 66/02/1951 66 y.o.  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  History of decompression L3-4 Active Problems:   Lumbar radiculopathy, acute   Discharged Condition: good  Hospital Course: Patient was admitted to undergo surgical decompression which she tolerated well.  He was ambulating postoperatively.  His incision is clean and dry.  He is discharged home  Consults: None  Significant Diagnostic Studies: None  Treatments: surgery: Laminotomy foraminotomies and discectomy L3-4 left with operating microscope  Discharge Exam: Blood pressure (!) 102/55, pulse 89, temperature 98.4 F (36.9 C), temperature source Oral, resp. rate 16, height 6' (1.829 m), weight 91.6 kg, SpO2 93 %. Incision is clean and dry Station and gait is intact  Disposition: Discharge disposition: 01-Home or Self Care       Discharge Instructions    Call MD for:  redness, tenderness, or signs of infection (pain, swelling, redness, odor or Helmstetter/yellow discharge around incision site)   Complete by:  As directed    Call MD for:  severe uncontrolled pain   Complete by:  As directed    Call MD for:  temperature >100.4   Complete by:  As directed    Diet - low sodium heart healthy   Complete by:  As directed    Discharge instructions   Complete by:  As directed    Okay to shower. Do not apply salves or appointments to incision. No heavy lifting with the upper extremities greater than 15 pounds. May resume driving when not requiring pain medication and patient feels comfortable with doing so.   Incentive spirometry RT   Complete by:  As directed    Increase activity slowly   Complete by:  As directed       Allergies as of 02/14/2018   No Known Allergies     Medication List    TAKE these medications   aspirin EC 81 MG tablet Take 81 mg by mouth daily.   atorvastatin 40 MG tablet Commonly known as:  LIPITOR Take 1 tablet (40 mg total) by mouth daily.   cyclobenzaprine 5 MG tablet Commonly known as:  FLEXERIL Take 1 tablet (5 mg total) by mouth 3 (three) times daily.   gabapentin 100 MG capsule Commonly known as:  NEURONTIN Take 1 capsule (100 mg total) by mouth 3 (three) times daily.   hydrochlorothiazide 12.5 MG capsule Commonly known as:  MICROZIDE Take 12.5 mg by mouth daily. Take with losartan   ibuprofen 200 MG tablet Commonly known as:  ADVIL,MOTRIN Take 400 mg by mouth every 4 (four) hours as needed for moderate pain.   lidocaine 5 % Commonly known as:  LIDODERM Place 1 patch onto the skin daily. Remove & Discard patch within 12 hours or as directed by MD   losartan 50 MG tablet Commonly known as:  COZAAR Take 50 mg by mouth daily. Take with hctz   methocarbamol 500 MG tablet Commonly known as:  ROBAXIN Take 1 tablet (500 mg total) by mouth every 6 (six) hours as needed for muscle spasms.   NITROSTAT 0.4 MG SL tablet Generic drug:  nitroGLYCERIN Place 0.4 mg under the tongue every 5 (five) minutes as needed for chest pain.   oxyCODONE 5 MG immediate release tablet Commonly known as:  Oxy  IR/ROXICODONE Take 1 tablet (5 mg total) by mouth every 6 (six) hours as needed for breakthrough pain.   pantoprazole 40 MG tablet Commonly known as:  PROTONIX Take 1 tablet (40 mg total) by mouth daily.   senna-docusate 8.6-50 MG tablet Commonly known as:  Senokot-S Take 2 tablets by mouth 2 (two) times daily.   traMADol 50 MG tablet Commonly known as:  ULTRAM Take 1 tablet (50 mg total) by mouth every 6 (six) hours as needed for moderate pain or severe pain. What changed:    how much to take  reasons to take this      Follow-up Information    Barnett AbuElsner,  Ladell Bey, MD Follow up.   Specialty:  Neurosurgery Contact information: [address] [place] [address] [phone]           Signed: Stefani DamaLSNER,Ruven Corradi J 02/14/2018, 7:46 PM

## 2018-02-14 NOTE — Anesthesia Postprocedure Evaluation (Signed)
Anesthesia Post Note  Patient: Jesse Hodges  Procedure(s) Performed: Left Lumbar three-four Microdiscectomy (Left Back)     Patient location during evaluation: PACU Anesthesia Type: General Level of consciousness: awake and alert Pain management: pain level controlled Vital Signs Assessment: post-procedure vital signs reviewed and stable Respiratory status: spontaneous breathing, nonlabored ventilation, respiratory function stable and patient connected to nasal cannula oxygen Cardiovascular status: blood pressure returned to baseline and stable Postop Assessment: no apparent nausea or vomiting Anesthetic complications: no    Last Vitals:  Vitals:   02/14/18 1041 02/14/18 1104  BP: 120/82 (!) 133/93  Pulse: 88 96  Resp: 17 18  Temp: 36.6 C 36.6 C  SpO2: 94% 97%    Last Pain:  Vitals:   02/14/18 1104  TempSrc: Oral  PainSc:                  [name] DAVID

## 2018-02-15 ENCOUNTER — Encounter (HOSPITAL_COMMUNITY): Payer: Self-pay | Admitting: Neurological Surgery

## 2018-02-15 DIAGNOSIS — M5116 Intervertebral disc disorders with radiculopathy, lumbar region: Secondary | ICD-10-CM | POA: Diagnosis not present

## 2018-02-15 NOTE — Evaluation (Signed)
Occupational Therapy Evaluation and Discharge Patient Details Name: Phylliss Bobhomas M Schicker MRN: [id number] DOB: 12/14/1950 Today's Date: 02/15/2018    History of Present Illness Jerrel Ivoryhomas Portales is a 67 y.o s/p L3-L4 Laminotomy foraminotomies and discectomy. PMH includes CAD + Stent, Degenerative Disc Disease, HTN, Dyslipiedimia, Chronic LBP, and radiating L Leg pain.   Clinical Impression   All education completed as detailed below with pt verbalizing understanding. Pt is functioning at a min guard to moderate assistance and will have assist at home for ADL and IADL. No further OT needs.    Follow Up Recommendations  No OT follow up    Equipment Recommendations  3 in 1 bedside commode    Recommendations for Other Services       Precautions / Restrictions Precautions Precautions: Back;Fall Precaution Booklet Issued: Yes (comment) Precaution Comments: reviewed back precautions related to ADL and IADL Restrictions Weight Bearing Restrictions: No Other Position/Activity Restrictions: no corset/brace orders from neurosurgery, etc      Mobility Bed Mobility Overal bed mobility: Needs Assistance Bed Mobility: Rolling;Sit to Sidelying Rolling: Supervision       Sit to sidelying: Supervision General bed mobility comments: cues for log roll technique  Transfers Overall transfer level: Needs assistance Equipment used: Rolling walker (2 wheeled) Transfers: Sit to/from Stand Sit to Stand: Min guard         General transfer comment: no physical assist from chair    Balance     Sitting balance-Leahy Scale: Good       Standing balance-Leahy Scale: Fair Standing balance comment: can release walker in static standing, needs walker for ambulation                           ADL either performed or assessed with clinical judgement   ADL Overall ADL's : Needs assistance/impaired Eating/Feeding: Independent;Sitting   Grooming: Min guard;Standing Grooming Details (indicate  cue type and reason): educated in two cup method and to use wash cloth for face and avoid bending Upper Body Bathing: Set up;Sitting Upper Body Bathing Details (indicate cue type and reason): recommended long bath sponge for back Lower Body Bathing: Moderate assistance;Sit to/from stand Lower Body Bathing Details (indicate cue type and reason): recommended long bath sponge Upper Body Dressing : Set up;Sitting   Lower Body Dressing: Minimal assistance;Bed level Lower Body Dressing Details (indicate cue type and reason): unable to cross foot over opposite knee in sitting, but can reach feet in supine, educated in safe footwear Toilet Transfer: Min guard;Ambulation;RW Toilet Transfer Details (indicate cue type and reason): aware 3 in 1 can be used over toilet Toileting- Clothing Manipulation and Hygiene: Supervision/safety;Sit to/from stand Toileting - Clothing Manipulation Details (indicate cue type and reason): educated in use of toileting tong and wet wipe   Tub/Shower Transfer Details (indicate cue type and reason): demonstrated tub transfer onto 3 in 1 Functional mobility during ADLs: Min guard;Rolling walker General ADL Comments: instructed in how to transport items safely with RW     Vision Patient Visual Report: No change from baseline       Perception     Praxis      Pertinent Vitals/Pain Pain Assessment: 0-10 Pain Score: 3  Pain Location: L hip, back Pain Descriptors / Indicators: Discomfort;Grimacing;Guarding Pain Intervention(s): Monitored during session;Repositioned     Hand Dominance Right   Extremity/Trunk Assessment Upper Extremity Assessment Upper Extremity Assessment: Overall WFL for tasks assessed   Lower Extremity Assessment Lower Extremity Assessment: Defer to PT  evaluation       Communication Communication Communication: No difficulties   Cognition Arousal/Alertness: Awake/alert Behavior During Therapy: WFL for tasks assessed/performed Overall  Cognitive Status: Within Functional Limits for tasks assessed                                 General Comments: Pt recalled 0/3 of precautions. Verbally reviewed again at end of session with pt recalling 3/3   General Comments       Exercises     Shoulder Instructions      Home Living Family/patient expects to be discharged to:: Private residence Living Arrangements: Spouse/significant other Available Help at Discharge: Family;Available PRN/intermittently Type of Home: House Home Access: Stairs to enter Entergy CorporationEntrance Stairs-Number of Steps: 4 Entrance Stairs-Rails: Can reach both Home Layout: Multi-level Alternate Level Stairs-Number of Steps: 2 Alternate Level Stairs-Rails: None Bathroom Shower/Tub: Chief Strategy OfficerTub/shower unit   Bathroom Toilet: Standard     Home Equipment: None          Prior Functioning/Environment Level of Independence: Independent        Comments: Pt reports being fairly active and walking around 3 weeks prior to surgery        OT Problem List:        OT Treatment/Interventions:      OT Goals(Current goals can be found in the care plan section) Acute Rehab OT Goals Patient Stated Goal: To walk  OT Frequency:     Barriers to D/C:            Co-evaluation              AM-PAC PT "6 Clicks" Daily Activity     Outcome Measure                 End of Session Nurse Communication: Other (comment)(equipment needs)  Activity Tolerance: Patient tolerated treatment well Patient left: in bed;with call bell/phone within reach  OT Visit Diagnosis: Unsteadiness on feet (R26.81);Muscle weakness (generalized) (M62.81);Other abnormalities of gait and mobility (R26.89);Pain                Time: [id number] OT Time Calculation (min): 22 min Charges:  OT General Charges $OT Visit: 1 Visit OT Evaluation $OT Eval Moderate Complexity: 1 Mod  Evern BioMayberry, Yeraldi Fidler Lynn 02/15/2018, 9:15 AM  02/15/2018 Martie RoundJulie Simranjit Thayer, OTR/L Pager: [phone]

## 2018-02-15 NOTE — Progress Notes (Signed)
Physical Therapy Treatment Patient Details Name: Jesse Hodges MRN: [id number] DOB: 11/30/1950 Today's Date: 02/15/2018    History of Present Illness Jesse Hodges is a 67 y.o s/p L3-L4 Laminotomy foraminotomies and discectomy. PMH includes CAD + Stent, Degenerative Disc Disease, HTN, Dyslipiedimia, Chronic LBP, and radiating L Leg pain.    PT Comments    Pt continues to make steady progress towards goal, negotiating stairs x3 initially with Min A progressing to Min G. Encouraged step-through gait pattern, but pt unable to make corrective changes secondary to L LE numbness and reports of ambulating with step-to pattern for a prolonged period. Pt educated on positioning and car transfers. Recommending RW at d/c for safety with mobility. Will continue to follow acutely per POC.     Follow Up Recommendations  Home health PT;Supervision for mobility/OOB     Equipment Recommendations  Rolling walker with 5" wheels;3in1 (PT)    Recommendations for Other Services       Precautions / Restrictions Precautions Precautions: Back;Fall Precaution Booklet Issued: Yes (comment) Precaution Comments: reviewed back precautions related to ADL and IADL Restrictions Weight Bearing Restrictions: No Other Position/Activity Restrictions: no corset/brace orders from neurosurgery, etc    Mobility  Bed Mobility Overal bed mobility: Needs Assistance Bed Mobility: Rolling;Sit to Sidelying Rolling: Supervision       Sit to sidelying: Supervision General bed mobility comments: cues for log roll technique  Transfers Overall transfer level: Needs assistance Equipment used: Rolling walker (2 wheeled) Transfers: Sit to/from Stand Sit to Stand: Min guard         General transfer comment: Min G for steadying RW upon standing. Pt demonstrated safe hand placement with ascent and descent. Increased time and effort noted with transfers.   Ambulation/Gait Ambulation/Gait assistance: Min guard Gait  Distance (Feet): 200 Feet Assistive device: Rolling walker (2 wheeled) Gait Pattern/deviations: Step-to pattern;Decreased step length - left;Decreased stance time - left;Antalgic Gait velocity: Decreased Gait velocity interpretation: <1.8 ft/sec, indicate of risk for recurrent falls General Gait Details: Min cues for safety and upright posture as pt had a tendency to gaze down at feet while ambulating. VCs to encourage step-through sequence, but pt reports he is more comfortable with step-to pattern with L foot leading and found step-through difficult. Pt slow and guarded and requires increased time for gait.    Stairs Stairs: Yes Stairs assistance: Min assist;Min guard Stair Management: One rail Right;One rail Left;Step to pattern;Forwards;Backwards Number of Stairs: 3 General stair comments: Pt initially anxious to negotiate stairs reporting his stairs at home are wider, requiring min A x1 progressing to Min G x2. Pt ascended stairs forwards with VC to lead with R LE and descend backwards with L LE, secondary to numbness in LLE    Wheelchair Mobility    Modified Rankin (Stroke Patients Only)       Balance Overall balance assessment: Needs assistance Sitting-balance support: No upper extremity supported;Feet supported Sitting balance-Leahy Scale: Good Sitting balance - Comments: Pt seated EOB finishing breakfast without any LOB.   Standing balance support: Bilateral upper extremity supported Standing balance-Leahy Scale: Fair Standing balance comment: Pt requires at least one UE support with dynamic standing activities.                            Cognition Arousal/Alertness: Awake/alert Behavior During Therapy: WFL for tasks assessed/performed Overall Cognitive Status: Within Functional Limits for tasks assessed  General Comments: Pt recalled 0/3 of precautions. Verbally reviewed again at end of session with pt recalling  3/3      Exercises      General Comments        Pertinent Vitals/Pain Pain Assessment: 0-10 Pain Score: 3  Pain Location: L hip, back Pain Descriptors / Indicators: Discomfort;Grimacing;Guarding Pain Intervention(s): Monitored during session;Repositioned    Home Living Family/patient expects to be discharged to:: Private residence Living Arrangements: Spouse/significant other Available Help at Discharge: Family;Available PRN/intermittently Type of Home: House Home Access: Stairs to enter Entrance Stairs-Rails: Can reach both Home Layout: Multi-level Home Equipment: None      Prior Function Level of Independence: Independent      Comments: Pt reports being fairly active and walking around 3 weeks prior to surgery   PT Goals (current goals can now be found in the care plan section) Acute Rehab PT Goals Patient Stated Goal: To walk PT Goal Formulation: With patient Time For Goal Achievement: 02/28/18 Potential to Achieve Goals: Good Progress towards PT goals: Progressing toward goals    Frequency    Min 5X/week      PT Plan Current plan remains appropriate    Co-evaluation              AM-PAC PT "6 Clicks" Daily Activity  Outcome Measure  Difficulty turning over in bed (including adjusting bedclothes, sheets and blankets)?: A Little Difficulty moving from lying on back to sitting on the side of the bed? : A Little Difficulty sitting down on and standing up from a chair with arms (e.g., wheelchair, bedside commode, etc,.)?: Unable Help needed moving to and from a bed to chair (including a wheelchair)?: A Little Help needed walking in hospital room?: A Little Help needed climbing 3-5 steps with a railing? : A Little 6 Click Score: 16    End of Session Equipment Utilized During Treatment: Gait belt Activity Tolerance: Patient tolerated treatment well Patient left: in chair;with call bell/phone within reach Nurse Communication: Mobility status PT  Visit Diagnosis: Other abnormalities of gait and mobility (R26.89);Muscle weakness (generalized) (M62.81);Difficulty in walking, not elsewhere classified (R26.2);Pain Pain - Right/Left: Left Pain - part of body: Leg;Hip(back)     Time: [id number] PT Time Calculation (min) (ACUTE ONLY): 26 min  Charges:  $Gait Training: 23-37 mins                     Jesse Hodges, [place]  Student Physical Therapist Acute Rehab [phone]    Jesse KohutKaylee Lieutenant Hodges 02/15/2018, 12:39 PM

## 2018-02-15 NOTE — Progress Notes (Signed)
Patient alert and oriented, mae's well, voiding adequate amount of urine, swallowing without difficulty,  c/o mild pain at time of discharge and medication given prior to discharge. Patient discharged home with family. Script and discharged instructions given to patient. Patient and family stated understanding of instructions given. Patient has an appointment with Dr. Elsner   

## 2018-02-15 NOTE — Care Management Note (Signed)
Case Management Note  Patient Details  Name: Phylliss Bobhomas M Longest MRN: [id number] Date of Birth: 07/18/1950  Subjective/Objective:   Patient is a  67 y.o s/p L3-L4 Laminotomy foraminotomies and discectomy.                Action/Plan: Case manager spoke with patient and his wife concerning discharge plan. Choice for Home health Agency was offered, referral was called to [facility], [facility] Lia[facility]. Patient has good family support. DME has been delivered to room.   Expected Discharge Date:  02/15/18               Expected Discharge Plan:  Home w Home Health Services  In-House Referral:  NA  Discharge planning Services  CM Consult  Post Acute Care Choice:  Home Health, Durable Medical Equipment Choice offered to:  Patient, Spouse  DME Arranged:  3-N-1, Walker rolling DME Agency:  [facility]  HH Arranged:  PT HH Agency:  [facility]  Status of Service:  Completed, signed off  If discussed at Long Length of Stay Meetings, dates discussed:    Additional Comments:  Durenda GuthrieBrady, Socrates Cahoon Naomi, RN 02/15/2018, 11:16 AM

## 2018-04-23 ENCOUNTER — Telehealth: Payer: Self-pay | Admitting: Cardiology

## 2018-04-23 NOTE — Telephone Encounter (Signed)
Patient has appt at [facility]   1. Which medications need to be refilled? (please list name of each medication and dose if known) Atorvastatin 40mg  tablet  2. Which pharmacy/location (including street and city if local pharmacy) is medication to be sent to? [facility] on [address] [place]  3. Do they need a 30 day or 90 day supply? 30

## 2018-04-24 MED ORDER — ATORVASTATIN CALCIUM 40 MG PO TABS: 40.0000 mg | ORAL_TABLET | Freq: Every day | ORAL | 0 refills | Status: DC

## 2018-04-24 NOTE — Telephone Encounter (Signed)
Dr. Donnie Aho patient. Atorvastatin 40  Mg daily refilled per Dr. Bing Matter.

## 2018-04-24 NOTE — Addendum Note (Signed)
Addended by: Lita Mains on: 04/24/2018 02:49 PM   Modules accepted: Orders

## 2018-05-10 ENCOUNTER — Other Ambulatory Visit: Payer: Self-pay | Admitting: Cardiology

## 2018-05-10 MED ORDER — LOSARTAN POTASSIUM-HCTZ 50-12.5 MG PO TABS: 1.0000 | ORAL_TABLET | Freq: Every day | ORAL | 0 refills | Status: DC

## 2018-05-10 NOTE — Telephone Encounter (Signed)
Patient needs appointment here in [place] before medications are refilled per Dr. Bing Matter

## 2018-05-10 NOTE — Telephone Encounter (Signed)
15 days sent in until patient is seen, per Dr. Bing Matter for Dr. Donnie Aho patient.

## 2018-05-10 NOTE — Telephone Encounter (Signed)
° ° °  1. Which medications need to be refilled? (please list name of each medication and dose if known) Hydrochlorothiazide 12.5 mg   2. Which pharmacy/location (including street and city if local pharmacy) is medication to be sent to?[id number]  3. Do they need a 30 day or 90 day supply? 30

## 2018-05-13 ENCOUNTER — Telehealth: Payer: Self-pay | Admitting: Cardiology

## 2018-05-13 NOTE — Telephone Encounter (Signed)
° ° ° °  1. Which medications need to be refilled? (please list name of each medication and dose if known) losartan hctz 50-12.5 mg tab 1QD  2. Which pharmacy/location (including street and city if local pharmacy) is medication to be sent to?[id number]  3. Do they need a 30 day or 90 day supply? 90

## 2018-05-14 NOTE — Telephone Encounter (Signed)
Medication was refilled on 05/10/18 by Marchelle FolksAmanda, CMA

## 2018-05-15 ENCOUNTER — Telehealth: Payer: Self-pay | Admitting: Cardiology

## 2018-05-17 ENCOUNTER — Encounter: Payer: Self-pay | Admitting: Cardiology

## 2018-05-17 ENCOUNTER — Ambulatory Visit: Payer: Federal, State, Local not specified - PPO | Admitting: Cardiology

## 2018-05-17 VITALS — BP 128/82 | HR 76 | Ht 72.5 in | Wt 208.0 lb

## 2018-05-17 DIAGNOSIS — E781 Pure hyperglyceridemia: Secondary | ICD-10-CM

## 2018-05-17 DIAGNOSIS — I2510 Atherosclerotic heart disease of native coronary artery without angina pectoris: Secondary | ICD-10-CM | POA: Diagnosis not present

## 2018-05-17 DIAGNOSIS — I10 Essential (primary) hypertension: Secondary | ICD-10-CM | POA: Diagnosis not present

## 2018-05-17 DIAGNOSIS — Z7189 Other specified counseling: Secondary | ICD-10-CM

## 2018-05-17 DIAGNOSIS — Z79899 Other long term (current) drug therapy: Secondary | ICD-10-CM

## 2018-05-17 MED ORDER — LOSARTAN POTASSIUM 50 MG PO TABS: 50.0000 mg | ORAL_TABLET | Freq: Every day | ORAL | 3 refills | Status: DC

## 2018-05-17 NOTE — Patient Instructions (Addendum)
Medication Instructions:  Your Physician recommend you continue on your current medication as directed.    If you need a refill on your cardiac medications before your next appointment, please call your pharmacy.   Lab work: Your physician recommends that you return for lab work next week (lipid)   Testing/Procedures: None  Follow-Up: At [facility], you and your health needs are our priority.  As part of our continuing mission to provide you with exceptional heart care, we have created designated Provider Care Teams.  These Care Teams include your primary Cardiologist (physician) and Advanced Practice Providers (APPs -  Physician Assistants and Nurse Practitioners) who all work together to provide you with the care you need, when you need it. You will need a follow up appointment in 1 years.  Please call our office 2 months in advance to schedule this appointment.  You may see Dr. Cristal Deerhristopher or one of the following Advanced Practice Providers on your designated Care Team:   Theodore DemarkRhonda Barrett, PA-C . Joni ReiningKathryn Lawrence, DNP, ANP  Any Other Special Instructions Will Be Listed Below (If Applicable).

## 2018-05-17 NOTE — Progress Notes (Signed)
Cardiology Office Note:    Date:  05/17/2018   ID:  Jesse Hodges, DOB 12/01/1950, MRN [id number]  PCP:  Clayborn Heronankins, Victoria R, MD  Cardiologist:  Jodelle RedBridgette Kyro Joswick, MD PhD (previously Dr. Donnie Ahoilley)  Referring MD: Clayborn Heronankins, Victoria R, MD   CC: follow up  History of Present Illness:    Jesse Hodges is a 67 y.o. male with a hx of CAD s/p PCI, hypertension, hyperlipidemia who is seen as a new patient to me/former patient of Dr. Donnie Ahoilley at the request of Rankins, Fanny DanceVictoria R, MD for the evaluation and management of CAD.  Cardiac history per Dr. Donnie Ahoilley records: Cath 07/2014 s/p promus stent to LAD; normal LM, 90% stenosis pLAD, nl LCx, nl RCA, LVEF 60%  Today: Doing very well. Enjoying retirement, staying active, cooking for himself. No angina, SOB, DOE, PND, orthopnea, LE edema, syncope, palpitations. Discussed his medications and lab results. Very elevated triglycerides. No history of pancreatitis. He notes that he has had this for many years, at one point well over 1000. Has been on OTC fish oil in the past and tolerated it well.  Never smoker, rare alcohol.  FH: mother deceased from heart failure. Father had hypertension.   Past Medical History:  Diagnosis Date  . Complication of anesthesia    wife states pt had a massive drop in temperature after one of his back surgeries  . Coronary artery disease 2016   1 stent  . DDD (degenerative disc disease), lumbar   . Dyslipidemia 07/16/2014  . Headache    migraines as a child  . Hypertension   . Low back pain     Past Surgical History:  Procedure Laterality Date  . BACK SURGERY     x 4 lumbar region  . CORONARY ANGIOPLASTY WITH STENT PLACEMENT  07/17/2014   "1"  . IR EPIDUROGRAPHY  02/05/2018  . LEFT HEART CATHETERIZATION WITH CORONARY ANGIOGRAM N/A 07/17/2014   Procedure: LEFT HEART CATHETERIZATION WITH CORONARY ANGIOGRAM;  Surgeon: Othella BoyerWilliam S Tilley, MD;  [facility] LAB;  Service: Cardiovascular;  Laterality: N/A;  . LUMBAR  LAMINECTOMY  ~ 1995; ~ 2000; ~ 2002; ~ 2004  . LUMBAR LAMINECTOMY/DECOMPRESSION MICRODISCECTOMY Left 02/14/2018   Procedure: Left Lumbar three-four Microdiscectomy;  Surgeon: Barnett AbuElsner, Henry, MD;  [facility];  Service: Neurosurgery;  Laterality: Left;  . SHOULDER ARTHROSCOPY Right 02/2007   adhesion release    Current Medications: Current Outpatient Medications on File Prior to Visit  Medication Sig  . aspirin EC 81 MG tablet Take 81 mg by mouth daily.  Marland Kitchen. atorvastatin (LIPITOR) 40 MG tablet Take 1 tablet (40 mg total) by mouth daily.  . hydrochlorothiazide (MICROZIDE) 12.5 MG capsule Take 12.5 mg by mouth daily. Take with losartan  . ibuprofen (ADVIL,MOTRIN) 200 MG tablet Take 400 mg by mouth every 4 (four) hours as needed for moderate pain.   No current facility-administered medications on file prior to visit.   also on losartan 50 mg   Allergies:   Patient has no known allergies.   Social History   Socioeconomic History  . Marital status: Married    Spouse name: Not on file  . Number of children: Not on file  . Years of education: Not on file  . Highest education level: Not on file  Occupational History  . Not on file  Social Needs  . Financial resource strain: Not on file  . Food insecurity:    Worry: Not on file    Inability: Not on file  .  Transportation needs:    Medical: Not on file    Non-medical: Not on file  Tobacco Use  . Smoking status: Never Smoker  . Smokeless tobacco: Never Used  Substance and Sexual Activity  . Alcohol use: Yes    Alcohol/week: 0.0 standard drinks    Comment: 07/17/2014 "a glass of wine a few times/yr"  . Drug use: No  . Sexual activity: Yes  Lifestyle  . Physical activity:    Days per week: Not on file    Minutes per session: Not on file  . Stress: Not on file  Relationships  . Social connections:    Talks on phone: Not on file    Gets together: Not on file    Attends religious service: Not on file    Active member of club or  organization: Not on file    Attends meetings of clubs or organizations: Not on file    Relationship status: Not on file  Other Topics Concern  . Not on file  Social History Narrative   Independent at baseline.  Lives at home with family     Family History: The patient's family history includes Hypertension in his father. Mother had heart failure.  ROS:   Please see the history of present illness.  Additional pertinent ROS:  Constitutional: Negative for chills, fever, night sweats, unintentional weight loss  HENT: Negative for ear pain and hearing loss.   Eyes: Negative for loss of vision and eye pain.  Respiratory: Negative for cough, sputum, shortness of breath, wheezing.   Cardiovascular: Negative for chest pain, palpitations, PND, orthopnea, lower extremity edema and claudication.  Gastrointestinal: Negative for abdominal pain, melena, and hematochezia.  Genitourinary: Negative for dysuria and hematuria.  Musculoskeletal: Negative for falls and myalgias.  Skin: Negative for itching and rash.  Neurological: Negative for focal weakness, focal sensory changes and loss of consciousness.  Endo/Heme/Allergies: Does not bruise/bleed easily.    EKGs/Labs/Other Studies Reviewed:    The following studies were reviewed today: Notes from Dr. York Spaniel office  EKG:  EKG is personally reviewed.  The ekg ordered today demonstrates normal sinus rhythm  Recent Labs: 02/04/2018: BUN 31; Creatinine, Ser 0.97; Potassium 3.7; Sodium 135 02/14/2018: Hemoglobin 15.0; Platelets 209  Recent Lipid Panel No results found for: CHOL, TRIG, HDL, CHOLHDL, VLDL, LDLCALC, LDLDIRECT  Physical Exam:    VS:  BP 128/82   Pulse 76   Ht 6' 0.5" (1.842 m)   Wt 208 lb (94.3 kg)   BMI 27.82 kg/m     Wt Readings from Last 3 Encounters:  05/17/18 208 lb (94.3 kg)  02/14/18 202 lb (91.6 kg)  02/07/18 199 lb 11.8 oz (90.6 kg)     GEN: Well nourished, well developed in no acute distress HEENT:  Normal NECK: No JVD; No carotid bruits LYMPHATICS: No lymphadenopathy CARDIAC: regular rhythm, normal S1 and S2, no murmurs, rubs, gallops. Radial and DP pulses 2+ bilaterally. RESPIRATORY:  Clear to auscultation without rales, wheezing or rhonchi  ABDOMEN: Soft, non-tender, non-distended MUSCULOSKELETAL:  No edema; No deformity  SKIN: Warm and dry NEUROLOGIC:  Alert and oriented x 3 PSYCHIATRIC:  Normal affect   ASSESSMENT:    1. Essential hypertension   2. Coronary artery disease involving native coronary artery of native heart without angina pectoris   3. Medication management   4. Hypertriglyceridemia   5. Counseling on health promotion and disease prevention    PLAN:   CAD s/p prior LAD PCI in 2016: doing well, no angina.  On aspirin and high intensity statin. More than three years out, no indication for beta blocker.  Hypertension: well controlled on losartan and HCTZ  Hypertriglyceridemia: he notes this for many years, with number well over 1000 in the past. No history of pancreatitis. Given his CVD history and the fact that he is already on a high intensity statin with good LDL control, he would benefit from vascepa based on the REDUCE-IT trial.  -we will get fasting lipids next week to check his current level (last labs 08/2016 with LDL 28 and TG 988)  -gave him 8 capsules (2 day sample) of vascepa. He has tolerated OTC fish oil in the past  -if TG still elevated and tolerates sample, will start vascepa 2 mg BID dosing.  -fibrate is alternative, though given need for statin would need to be careful about risk of interaction.  -diet and lifestyle counseling as below  Secondary prevention -recommend heart healthy/Mediterranean diet, with whole grains, fruits, vegetable, fish, lean meats, nuts, and olive oil. Limit salt. -recommend moderate walking, 3-5 times/week for 30-50 minutes each session. Aim for at least 150 minutes.week. Goal should be pace of 3 miles/hours, or walking  1.5 miles in 30 minutes -recommend avoidance of tobacco products. Avoid excess alcohol. -Additional risk factor control:  -Diabetes: A1c is 5.8, no history of diabetes  -Lipids: TG elevated as above, LDL with excellent control. HDL low, encourage activity  -Blood pressure control: as above  -Weight: BMI 27.8, counseled on lifestyle  Plan for follow up: 1 year or sooner PRN  Medication Adjustments/Labs and Tests Ordered: Current medicines are reviewed at length with the patient today.  Concerns regarding medicines are outlined above.  Orders Placed This Encounter  Procedures  . Lipid panel  . EKG 12-Lead   Meds ordered this encounter  Medications  . losartan (COZAAR) 50 MG tablet    Sig: Take 1 tablet (50 mg total) by mouth daily.    Dispense:  90 tablet    Refill:  3    Patient Instructions  Medication Instructions:  Your Physician recommend you continue on your current medication as directed.    If you need a refill on your cardiac medications before your next appointment, please call your pharmacy.   Lab work: Your physician recommends that you return for lab work next week (lipid)   Testing/Procedures: None  Follow-Up: At [facility], you and your health needs are our priority.  As part of our continuing mission to provide you with exceptional heart care, we have created designated Provider Care Teams.  These Care Teams include your primary Cardiologist (physician) and Advanced Practice Providers (APPs -  Physician Assistants and Nurse Practitioners) who all work together to provide you with the care you need, when you need it. You will need a follow up appointment in 1 years.  Please call our office 2 months in advance to schedule this appointment.  You may see Dr. Cristal Deer or one of the following Advanced Practice Providers on your designated Care Team:   Theodore Demark, PA-C . Joni Reining, DNP, ANP  Any Other Special Instructions Will Be Listed Below (If  Applicable).       Signed, Jodelle Red, MD PhD 05/17/2018 11:21 AM    [facility]

## 2018-05-18 ENCOUNTER — Other Ambulatory Visit: Payer: Self-pay | Admitting: Cardiology

## 2018-05-20 NOTE — Telephone Encounter (Signed)
Please refill as patient was seen by Dr[facility] Cristal [facility]ristopher [facility] on 05/17/[facility]. Thanks!

## 2018-06-03 NOTE — Telephone Encounter (Signed)
done

## 2018-06-07 LAB — LIPID PANEL
CHOL/HDL RATIO: 7.8 ratio — AB (ref 0.0–5.0)
Cholesterol, Total: 171 mg/dL (ref 100–199)
HDL: 22 mg/dL — AB (ref >39)
Triglycerides: 690 mg/dL (ref 0–149)

## 2018-06-12 ENCOUNTER — Other Ambulatory Visit: Payer: Self-pay

## 2018-06-12 DIAGNOSIS — E781 Pure hyperglyceridemia: Secondary | ICD-10-CM

## 2018-06-12 MED ORDER — ICOSAPENT ETHYL 1 G PO CAPS: 2.0000 g | ORAL_CAPSULE | Freq: Two times a day (BID) | ORAL | 3 refills | Status: DC

## 2018-06-12 NOTE — Progress Notes (Signed)
Thanks for confirming it was fasting. If he is tolerating the vascepa samples, I would like to have him set up on that long term. I believe I gave him some coupons for the vascepa with the samples. If he is amenable, we can send the presciption in for him. If he'd prefer, we can also get him set up with the lipid clinic to help manage. Thank you.

## 2018-07-08 ENCOUNTER — Other Ambulatory Visit: Payer: Self-pay | Admitting: Cardiology

## 2018-07-08 NOTE — Patient Instructions (Signed)
Continue with the Vascepa 2 capsules twice daily.    We will repeat your cholesterol lab panel in about 6-8 weeks.  Depending on your triglyceride levels we will consider adding fenofibrate.      High Triglycerides Eating Plan Triglycerides are a type of fat in the blood. High levels of triglycerides can increase your risk of heart disease and stroke. If your triglyceride levels are high, choosing the right foods can help lower your triglycerides and keep your heart healthy. Work with your health care provider or a diet and nutrition specialist (dietitian) to develop an eating plan that is right for you. What are tips for following this plan? General guidelines   Lose weight, if you are overweight. For most people, losing 5-10 lbs (2-5 kg) helps lower triglyceride levels. A weight-loss plan may include. ? 30 minutes of exercise at least 5 days a week. ? Reducing the amount of calories, sugar, and fat you eat.  Eat a wide variety of fresh fruits, vegetables, and whole grains. These foods are high in fiber.  Eat foods that contain healthy fats, such as fatty fish, nuts, seeds, and olive oil.  Avoid foods that are high in added sugar, added salt (sodium), saturated fat, and trans fat.  Avoid low-fiber, refined carbohydrates such as white bread, crackers, noodles, and white rice.  Avoid foods with partially hydrogenated oils (trans fats), such as fried foods or stick margarine.  Limit alcohol intake to no more than 1 drink a day for nonpregnant women and 2 drinks a day for men. One drink equals 12 oz of beer, 5 oz of wine, or 1 oz of hard liquor. Your health care provider may recommend that you drink less depending on your overall health. Reading food labels  Check food labels for the amount of saturated fat. Choose foods with no or very little saturated fat.  Check food labels for the amount of trans fat. Choose foods with no trans fat.  Check food labels for the amount of cholesterol.  Choose foods low in cholesterol. Ask your dietitian how much cholesterol you should have each day.  Check food labels for the amount of sodium. Choose foods with less than 140 milligrams (mg) per serving. Shopping  Buy dairy products labeled as nonfat (skim) or low-fat (1%).  Avoid buying processed or prepackaged foods. These are often high in added sugar, sodium, and fat. Cooking  Choose healthy fats when cooking, such as olive oil or canola oil.  Cook foods using lower fat methods, such as baking, broiling, boiling, or grilling.  Make your own sauces, dressings, and marinades when possible, instead of buying them. Store-bought sauces, dressings, and marinades are often high in sodium and sugar. Meal planning  Eat more home-cooked food and less restaurant, buffet, and fast food.  Eat fatty fish at least 2 times each week. Examples of fatty fish include salmon, trout, mackerel, tuna, and herring.  If you eat whole eggs, do not eat more than 3 egg yolks per week. What foods are recommended? The items listed may not be a complete list. Talk with your dietitian about what dietary choices are best for you. Grains Whole wheat or whole grain breads, crackers, cereals, and pasta. Unsweetened oatmeal. Bulgur. Barley. Quinoa. Brown rice. Whole wheat flour tortillas. Vegetables Fresh or frozen vegetables. Low-sodium canned vegetables. Fruits All fresh, canned (in natural juice), or frozen fruits. Meats and other protein foods Skinless chicken or [place]. Ground chicken or [place]. Lean cuts of pork, trimmed of fat.  Fish and seafood, especially salmon, trout, and herring. Egg whites. Dried beans, peas, or lentils. Unsalted nuts or seeds. Unsalted canned beans. Natural peanut or almond butter. Dairy Low-fat dairy products. Skim or low-fat (1%) milk. Reduced fat (2%) and low-sodium cheese. Low-fat ricotta cheese. Low-fat cottage cheese. Plain, low-fat yogurt. Fats and oils Tub margarine without  trans fats. Light or reduced-fat mayonnaise. Light or reduced-fat salad dressings. Avocado. Safflower, olive, sunflower, soybean, and canola oils. What foods are not recommended? The items listed may not be a complete list. Talk with your dietitian about what dietary choices are best for you. Grains White bread. White (regular) pasta. White rice. Cornbread. Bagels. Pastries. Crackers that contain trans fat. Vegetables Creamed or fried vegetables. Vegetables in a cheese sauce. Fruits Sweetened dried fruit. Canned fruit in syrup. Fruit juice. Meats and other protein foods Fatty cuts of meat. Ribs. Chicken wings. Tomasa Blase. Sausage. Bologna. Salami. Chitterlings. Fatback. Hot dogs. Bratwurst. Packaged lunch meats. Dairy Whole or reduced-fat (2%) milk. Half-and-half. Cream cheese. Full-fat or sweetened yogurt. Full-fat cheese. Nondairy creamers. Whipped toppings. Processed cheese or cheese spreads. Cheese curds. Beverages Alcohol. Sweetened drinks, such as soda, lemonade, fruit drinks, or punches. Fats and oils Butter. Stick margarine. Lard. Shortening. Ghee. Bacon fat. Tropical oils, such as coconut, palm kernel, or palm oils. Sweets and desserts Corn syrup. Sugars. Honey. Molasses. Candy. Jam and jelly. Syrup. Sweetened cereals. Cookies. Pies. Cakes. Donuts. Muffins. Ice cream. Condiments Store-bought sauces, dressings, and marinades that are high in sugar, such as ketchup and barbecue sauce. Summary  High levels of triglycerides can increase the risk of heart disease and stroke. Choosing the right foods can help lower your triglycerides.  Eat plenty of fresh fruits, vegetables, and whole grains. Choose low-fat dairy and lean meats. Eat fatty fish at least twice a week.  Avoid processed and prepackaged foods with added sugar, sodium, saturated fat, and trans fat.  If you need suggestions or have questions about what types of food are good for you, talk with your health care provider or a  dietitian. This information is not intended to replace advice given to you by your health care provider. Make sure you discuss any questions you have with your health care provider. Document Released: 04/06/2004 Document Revised: 08/22/2016 Document Reviewed: 08/22/2016 Elsevier Interactive Patient Education  2019 [employer].

## 2018-07-09 ENCOUNTER — Encounter (INDEPENDENT_AMBULATORY_CARE_PROVIDER_SITE_OTHER): Payer: Self-pay

## 2018-07-09 ENCOUNTER — Encounter: Payer: Self-pay | Admitting: Pharmacist Clinician (PhC)/ Clinical Pharmacy Specialist

## 2018-07-09 ENCOUNTER — Ambulatory Visit (INDEPENDENT_AMBULATORY_CARE_PROVIDER_SITE_OTHER): Payer: Federal, State, Local not specified - PPO | Admitting: Pharmacist Clinician (PhC)/ Clinical Pharmacy Specialist

## 2018-07-09 DIAGNOSIS — E781 Pure hyperglyceridemia: Secondary | ICD-10-CM | POA: Diagnosis not present

## 2018-07-09 NOTE — Progress Notes (Signed)
07/09/2018 Jesse Hodges [birth year] [id number]   HPI:  Jesse Hodges is a 68 y.o. male patient of Dr Jesse Hodges, who presents today for a lipid clinic evaluation.  In addition to hypertriglyceridemia his medical history is significant for CAD s/p DES, crescendo angina and degenerative disc disease.    Patient notes that he has had elevated triglycerides for many years, with readings going to > 1,000.  He has no history of pancreatitis.  When he was hospitalized in August for back problems, his blood sugar was as high as 194.  He states that since being discharged his sugar levels have returned to normal and he is not diabetic.      Patient reports that he has previously taken gemfibrozil and fenofibrate, but that neither medication "worked".  He states the only time he has noted improvements was when he was more careful with his diet.  His main goal it to keep his levels from fluctuating so much.    Current Medications:  Atorvastatin 40 mg  vascepa 2 gm bid  Cholesterol Goals:  TG < 150   Intolerant/previously tried: gemfibrozil and fenofibrate - "didn't work"  Family history:   Mother with hypertriglyceridemia - lived to 50, died from aneurysm,   also younger brother - stable around 300-400  Father living, just turned 100 in October  3 other siblings without triglyceride problems  1 son (33) has "made poor life choices"  Diet: eats mostly home cooked, has recently tapered back on fried chicken and potatoes; doesn't eat much beef, plenty of chicken and fish (salmon and trout); plenty of vegetables; some crackers, no cookies,.  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    Labs:  TC 171, TG 690, HDL 22, LDL not calculated - started Vascepa about this time   Current Outpatient Medications  Medication Sig Dispense Refill  . aspirin EC 81 MG tablet Take 81 mg by mouth daily.    Marland Kitchen atorvastatin (LIPITOR) 40  MG tablet TAKE 1 TABLET BY MOUTH EVERY DAY 30 tablet 11  . hydrochlorothiazide (MICROZIDE) 12.5 MG capsule Take 12.5 mg by mouth daily. Take with losartan    . Icosapent Ethyl 1 g CAPS Take 2 capsules (2 g total) by mouth 2 (two) times daily. 180 capsule 3  . losartan (COZAAR) 50 MG tablet Take 1 tablet (50 mg total) by mouth daily. 90 tablet 3  . ibuprofen (ADVIL,MOTRIN) 200 MG tablet Take 400 mg by mouth every 4 (four) hours as needed for moderate pain.     No current facility-administered medications for this visit.     No Known Allergies  Past Medical History:  Diagnosis Date  . Complication of anesthesia    wife states pt had a massive drop in temperature after one of his back surgeries  . Coronary artery disease 2016   1 stent  . DDD (degenerative disc disease), lumbar   . Dyslipidemia 07/16/2014  . Headache    migraines as a child  . Hypertension   . Low back pain     There were no vitals taken for this visit.   Hypertriglyceridemia Patient with familial hypertriglyceridemia, currently at 690.  He started Vascepa 2 gm bid about the time his labs were drawn.  He will continue with his current medications for now and repeat lipid labs in 2 months to determine effect of Vascepa.  I don't expect that he will reach  a TG goal of < 150, but depending on his results, would consider adding fenofibrate again for re-challenge.  Would stress that fenofibrate works best when taken with meals.     Jesse Hodges PharmD Jesse Hodges

## 2018-07-09 NOTE — Assessment & Plan Note (Signed)
Patient with familial hypertriglyceridemia, currently at 690.  He started Vascepa 2 gm bid about the time his labs were drawn.  He will continue with his current medications for now and repeat lipid labs in 2 months to determine effect of Vascepa.  I don't expect that he will reach a TG goal of < 150, but depending on his results, would consider adding fenofibrate again for re-challenge.  Would stress that fenofibrate works best when taken with meals.

## 2018-09-12 ENCOUNTER — Other Ambulatory Visit: Payer: Self-pay | Admitting: Cardiology

## 2018-09-12 LAB — LIPID PANEL
Chol/HDL Ratio: 9.1 ratio — ABNORMAL HIGH (ref 0.0–5.0)
Cholesterol, Total: 172 mg/dL (ref 100–199)
HDL: 19 mg/dL — AB (ref >39)
TRIGLYCERIDES: 718 mg/dL — AB (ref 0–149)

## 2018-09-12 LAB — HEPATIC FUNCTION PANEL
ALK PHOS: 62 IU/L (ref 39–117)
ALT: 84 IU/L — AB (ref 0–44)
AST: 62 IU/L — AB (ref 0–40)
Albumin: 4.6 g/dL (ref 3.8–4.8)
Bilirubin Total: 0.9 mg/dL (ref 0.0–1.2)
Bilirubin, Direct: 0.25 mg/dL (ref 0.00–0.40)
TOTAL PROTEIN: 6.6 g/dL (ref 6.0–8.5)

## 2018-09-17 NOTE — Progress Notes (Signed)
Jesse Hodges, can you call patient? TG not improved on vascepa. LFTs mildly elevated as well. I would suggest a low carb/low fat diet, continue the vascepa, and add fenofibrate tablets 160 mg daily. We will recheck LFTs and lipids fasting in 6 weeks. Thank you!

## 2018-09-18 ENCOUNTER — Other Ambulatory Visit: Payer: Self-pay

## 2018-09-18 DIAGNOSIS — Z79899 Other long term (current) drug therapy: Secondary | ICD-10-CM

## 2018-09-18 MED ORDER — FENOFIBRATE 160 MG PO TABS: 160.0000 mg | ORAL_TABLET | Freq: Every day | ORAL | 11 refills | Status: DC

## 2018-10-25 ENCOUNTER — Other Ambulatory Visit: Payer: Self-pay | Admitting: Cardiology

## 2018-11-08 ENCOUNTER — Other Ambulatory Visit: Payer: Self-pay

## 2018-11-08 DIAGNOSIS — I10 Essential (primary) hypertension: Secondary | ICD-10-CM

## 2018-11-08 LAB — LIPID PANEL
Chol/HDL Ratio: 5.2 ratio — ABNORMAL HIGH (ref 0.0–5.0)
Cholesterol, Total: 130 mg/dL (ref 100–199)
HDL: 25 mg/dL — ABNORMAL LOW (ref >39)
LDL Calculated: 61 mg/dL (ref 0–99)
Triglycerides: 220 mg/dL — ABNORMAL HIGH (ref 0–149)
VLDL Cholesterol Cal: 44 mg/dL — ABNORMAL HIGH (ref 5–40)

## 2018-11-08 LAB — HEPATIC FUNCTION PANEL
ALT: 83 IU/L — AB (ref 0–44)
AST: 70 IU/L — ABNORMAL HIGH (ref 0–40)
Albumin: 4.7 g/dL (ref 3.8–4.8)
Alkaline Phosphatase: 52 IU/L (ref 39–117)
Bilirubin Total: 0.8 mg/dL (ref 0.0–1.2)
Bilirubin, Direct: 0.24 mg/dL (ref 0.00–0.40)
Total Protein: 6.7 g/dL (ref 6.0–8.5)

## 2018-11-08 MED ORDER — LOSARTAN POTASSIUM 50 MG PO TABS: 50.0000 mg | ORAL_TABLET | Freq: Every day | ORAL | 3 refills | Status: DC

## 2018-11-08 MED ORDER — HYDROCHLOROTHIAZIDE 12.5 MG PO CAPS: 12.5000 mg | ORAL_CAPSULE | Freq: Every day | ORAL | 3 refills | Status: DC

## 2018-11-08 NOTE — Telephone Encounter (Signed)
Spoke with Baldomero Lamy who states pt presented for lab work and approached her requesting refill of losartan-HCTZ 50-12.5 mg. Med list has losartan 50 mg and HCTZ 12.5 mg but not combo. Refilled losartan 50 mg and HCTZ 12.5 mg.

## 2018-12-05 ENCOUNTER — Other Ambulatory Visit: Payer: Self-pay | Admitting: Cardiology

## 2018-12-05 MED ORDER — OLMESARTAN MEDOXOMIL 5 MG PO TABS: 5.0000 mg | ORAL_TABLET | Freq: Every day | ORAL | 5 refills | Status: DC

## 2018-12-05 NOTE — Telephone Encounter (Signed)
Patient has been out of losartan for about a week, according to wife.  She isn't sure if he's been checking his pressure much.  She looked at his home meter and most readings were in the 120-130's, but not sure how old they are.    Will start him on olmesartan 5 mg for now and will probably need to increase to 10 mg, depending on home readings.  He is to check home BP daily for then next 10 days and call us with those readings.  From there we can determine need to increase medication.    Wife voiced understanding.

## 2018-12-05 NOTE — Telephone Encounter (Signed)
New Message:     Pt says his pharmacy do not have the 50 mg of Losartan. They are not sure when they will get them in. He wants to know if you want him to take the 25 mg. If so, please  Call to [facility] [phone].

## 2019-03-16 ENCOUNTER — Other Ambulatory Visit: Payer: Self-pay | Admitting: Cardiology

## 2019-03-18 ENCOUNTER — Other Ambulatory Visit: Payer: Self-pay | Admitting: Family Medicine

## 2019-03-18 DIAGNOSIS — R7989 Other specified abnormal findings of blood chemistry: Secondary | ICD-10-CM

## 2019-03-20 ENCOUNTER — Ambulatory Visit
Admission: RE | Admit: 2019-03-20 | Discharge: 2019-03-20 | Disposition: A | Discharge status: Home / Self Care | Payer: Federal, State, Local not specified - PPO | Source: Ambulatory Visit | Attending: Family Medicine | Admitting: Family Medicine

## 2019-03-20 DIAGNOSIS — R7989 Other specified abnormal findings of blood chemistry: Secondary | ICD-10-CM

## 2019-05-21 ENCOUNTER — Ambulatory Visit: Payer: Federal, State, Local not specified - PPO | Admitting: Cardiology

## 2019-05-21 ENCOUNTER — Encounter: Payer: Self-pay | Admitting: Cardiology

## 2019-05-21 ENCOUNTER — Other Ambulatory Visit: Payer: Self-pay

## 2019-05-21 VITALS — BP 122/64 | HR 76 | Temp 97.3°F | Ht 70.0 in | Wt 221.0 lb

## 2019-05-21 DIAGNOSIS — I2510 Atherosclerotic heart disease of native coronary artery without angina pectoris: Secondary | ICD-10-CM | POA: Diagnosis not present

## 2019-05-21 DIAGNOSIS — I10 Essential (primary) hypertension: Secondary | ICD-10-CM | POA: Diagnosis not present

## 2019-05-21 DIAGNOSIS — E781 Pure hyperglyceridemia: Secondary | ICD-10-CM | POA: Diagnosis not present

## 2019-05-21 DIAGNOSIS — Z7189 Other specified counseling: Secondary | ICD-10-CM

## 2019-05-21 DIAGNOSIS — Z79899 Other long term (current) drug therapy: Secondary | ICD-10-CM | POA: Diagnosis not present

## 2019-05-21 NOTE — Patient Instructions (Signed)
Medication Instructions:  Your Physician recommend you continue on your current medication as directed.    *If you need a refill on your cardiac medications before your next appointment, please call your pharmacy*  Lab Work: Your physician recommends that you return for lab work in 6 months ( fasting lipids)  If you have labs (blood work) drawn today and your tests are completely normal, you will receive your results only by: [place] MyChart Message (if you have MyChart) OR . A paper copy in the mail If you have any lab test that is abnormal or we need to change your treatment, we will call you to review the results.  Testing/Procedures: None  Follow-Up: At [facility], you and your health needs are our priority.  As part of our continuing mission to provide you with exceptional heart care, we have created designated Provider Care Teams.  These Care Teams include your primary Cardiologist (physician) and Advanced Practice Providers (APPs -  Physician Assistants and Nurse Practitioners) who all work together to provide you with the care you need, when you need it.  Your next appointment:   6 month(s)  The format for your next appointment:   In Person  Provider:   Buford Dresser, MD

## 2019-05-21 NOTE — Progress Notes (Signed)
Cardiology Office Note:    Date:  05/21/2019   ID:  Jesse Bobhomas M Scoggins, DOB 06/25/1951, MRN [id number]  PCP:  Clayborn Heronankins, Victoria R, MD  Cardiologist:  Jodelle RedBridgette Tisa Weisel, MD PhD (previously Dr. Donnie Ahoilley)  Referring MD: Clayborn Heronankins, Victoria R, MD   CC: follow up  History of Present Illness:    Jesse Hodges is a 68 y.o. male with a hx of CAD s/p PCI, hypertension, hyperlipidemia who is seen for follow up today. He was a new patient to me on 05/17/18/former patient of Dr. Donnie Ahoilley at the request of Rankins, Fanny DanceVictoria R, MD for the evaluation and management of CAD.  Cardiac history per Dr. Donnie Ahoilley records: Cath 07/2014 s/p promus stent to LAD; normal LM, 90% stenosis pLAD, nl LCx, nl RCA, LVEF 60%  Today: Staying safe from covid, avoids crowds. Wonders if he has ever had a meningitis vaccine, can't see by my records. Will ask Dr. Barbaraann Barthelankins about this at his next visit.   Has not been exercising regularly, weight has been up and down within 10 lbs depending on his activity level. Has been busy preparing for winter. Preparing his garden for the spring.   Reviewed lipids. Was only on vascepa for about a month. TG went from 718 to 220 on vascepa, but cost ranged from $3 to $40 a bottle. Started fenofibrate instead, last TG was 330 on this. We discussed pros/cons of vascepa. Reviewed diet/exercise. He is working on eating more vegetables.   Denies chest pain, shortness of breath at rest or with normal exertion. No PND, orthopnea, LE edema or unexpected weight gain. No syncope or palpitations.   Past Medical History:  Diagnosis Date   Complication of anesthesia    wife states pt had a massive drop in temperature after one of his back surgeries   Coronary artery disease 2016   1 stent   DDD (degenerative disc disease), lumbar    Dyslipidemia 07/16/2014   Headache    migraines as a child   Hypertension    Low back pain     Past Surgical History:  Procedure Laterality Date   BACK SURGERY      x 4 lumbar region   CORONARY ANGIOPLASTY WITH STENT PLACEMENT  07/17/2014   "1"   IR EPIDUROGRAPHY  02/05/2018   LEFT HEART CATHETERIZATION WITH CORONARY ANGIOGRAM N/A 07/17/2014   Procedure: LEFT HEART CATHETERIZATION WITH CORONARY ANGIOGRAM;  Surgeon: Othella BoyerWilliam S Tilley, MD;  [facility];  Service: Cardiovascular;  Laterality: N/A;   LUMBAR LAMINECTOMY  ~ 1995; ~ 2000; ~ 2002; ~ 2004   LUMBAR LAMINECTOMY/DECOMPRESSION MICRODISCECTOMY Left 02/14/2018   Procedure: Left Lumbar three-four Microdiscectomy;  Surgeon: Barnett AbuElsner, Henry, MD;  [facility];  Service: Neurosurgery;  Laterality: Left;   SHOULDER ARTHROSCOPY Right 02/2007   adhesion release    Current Medications: Current Outpatient Medications on File Prior to Visit  Medication Sig   aspirin EC 81 MG tablet Take 81 mg by mouth daily.   atorvastatin (LIPITOR) 40 MG tablet TAKE 1 TABLET BY MOUTH EVERY DAY   fenofibrate 160 MG tablet Take 1 tablet (160 mg total) by mouth daily.   hydrochlorothiazide (MICROZIDE) 12.5 MG capsule Take 1 capsule (12.5 mg total) by mouth daily. Take with losartan   ibuprofen (ADVIL,MOTRIN) 200 MG tablet Take 400 mg by mouth every 4 (four) hours as needed for moderate pain.   losartan (COZAAR) 50 MG tablet Take 1 tablet (50 mg total) by mouth daily.   olmesartan (BENICAR) 5 MG  tablet TAKE 1 TABLET BY MOUTH EVERY DAY   No current facility-administered medications on file prior to visit.    Allergies:   Patient has no known allergies.   Social History   Tobacco Use   Smoking status: Never Smoker   Smokeless tobacco: Never Used  Substance Use Topics   Alcohol use: Yes    Alcohol/week: 0.0 standard drinks    Comment: 07/17/2014 "a glass of wine a few times/yr"   Drug use: No    Family History: The patient's family history includes Hypertension in his father. mother deceased from heart failure. Father has hypertension. Father alive at 29. Many relatives lived until their  90s  ROS:   Please see the history of present illness.  Additional pertinent ROS: Constitutional: Negative for chills, fever, night sweats, unintentional weight loss  HENT: Negative for ear pain and hearing loss.   Eyes: Negative for loss of vision and eye pain.  Respiratory: Negative for cough, sputum, wheezing.   Cardiovascular: See HPI. Gastrointestinal: Negative for abdominal pain, melena, and hematochezia.  Genitourinary: Negative for dysuria and hematuria.  Musculoskeletal: Negative for falls and myalgias.  Skin: Negative for itching and rash.  Neurological: Negative for focal weakness, focal sensory changes and loss of consciousness.  Endo/Heme/Allergies: Does not bruise/bleed easily.   EKGs/Labs/Other Studies Reviewed:    The following studies were reviewed today: Notes from Dr. York Spaniel office  EKG:  EKG is personally reviewed.  The ekg ordered today demonstrates normal sinus rhythm  Recent Labs: 11/08/2018: ALT 83  Recent Lipid Panel    Component Value Date/Time   CHOL 130 11/08/2018 1030   TRIG 220 (H) 11/08/2018 1030   HDL 25 (L) 11/08/2018 1030   CHOLHDL 5.2 (H) 11/08/2018 1030   LDLCALC 61 11/08/2018 1030    Physical Exam:    VS:  BP 122/64 (BP Location: Right Arm, Patient Position: Sitting, Cuff Size: Normal)    Pulse 76    Temp (!) 97.3 F (36.3 C)    Ht 5\' 10"  (1.778 m)    Wt 221 lb (100.2 kg)    BMI 31.71 kg/m     Wt Readings from Last 3 Encounters:  05/21/19 221 lb (100.2 kg)  05/17/18 208 lb (94.3 kg)  02/14/18 202 lb (91.6 kg)    GEN: Well nourished, well developed in no acute distress HEENT: Normal, moist mucous membranes NECK: No JVD CARDIAC: regular rhythm, normal S1 and S2, no rubs or gallops. No murmurs. VASCULAR: Radial and DP pulses 2+ bilaterally. No carotid bruits RESPIRATORY:  Clear to auscultation without rales, wheezing or rhonchi  ABDOMEN: Soft, non-tender, non-distended MUSCULOSKELETAL:  Ambulates independently SKIN: Warm and dry,  no edema NEUROLOGIC:  Alert and oriented x 3. No focal neuro deficits noted. PSYCHIATRIC:  Normal affect   ASSESSMENT:    1. Coronary artery disease involving native coronary artery of native heart without angina pectoris   2. Medication management   3. Essential hypertension   4. Hypertriglyceridemia   5. Counseling on health promotion and disease prevention   6. Cardiac risk counseling    PLAN:   CAD s/p prior LAD PCI in 2016:  -no angina -Aspirin 81 mg -statin: atorvastatin 40 mg daily, tolerating -LDL goal <70, last LDL 61 -antianginals: none required -counseled on diet recommendations and AHA exercise guidelines, below -risk factor modification, as below -counseled on red flag warning signs that need immediate medical attention  Hypertension: goal <130/80 -well controlled on losartan and HCTZ, continue  Hypertriglyceridemia: he notes  this for many years, with number well over 1000 in the past. No history of pancreatitis.  -attempted vascepa with good drop in TG (though did not get below 150). However, the cost of this varied for him.  -changed to fibrate. No side effects noted (given that he is on high intensity statin) -last TG 330, not at goal -he notes intermittent exercise/less than ideal diet. He would like to work on this -recheck fasting lipids prior to next appt.  Secondary prevention -recommend heart healthy/Mediterranean diet, with whole grains, fruits, vegetable, fish, lean meats, nuts, and olive oil. Limit salt. -recommend moderate walking, 3-5 times/week for 30-50 minutes each session. Aim for at least 150 minutes.week. Goal should be pace of 3 miles/hours, or walking 1.5 miles in 30 minutes -recommend avoidance of tobacco products. Avoid excess alcohol. -Additional risk factor control:  -Diabetes: A1c is 5.8, no history of diabetes  -Lipids: as above  -Blood pressure control: as above  -Weight: BMI 31 from 27, working on weight loss/diet/exercse  Plan  for follow up: 6 mos, fasting lipids prior  Medication Adjustments/Labs and Tests Ordered: Current medicines are reviewed at length with the patient today.  Concerns regarding medicines are outlined above.  Orders Placed This Encounter  Procedures   Lipid panel   EKG 12-Lead   No orders of the defined types were placed in this encounter.   Patient Instructions  Medication Instructions:  Your Physician recommend you continue on your current medication as directed.    *If you need a refill on your cardiac medications before your next appointment, please call your pharmacy*  Lab Work: Your physician recommends that you return for lab work in 6 months ( fasting lipids)  If you have labs (blood work) drawn today and your tests are completely normal, you will receive your results only by:  [place] (if you have MyChart) OR  A paper copy in the mail If you have any lab test that is abnormal or we need to change your treatment, we will call you to review the results.  Testing/Procedures: None  Follow-Up: At [facility], you and your health needs are our priority.  As part of our continuing mission to provide you with exceptional heart care, we have created designated Provider Care Teams.  These Care Teams include your primary Cardiologist (physician) and Advanced Practice Providers (APPs -  Physician Assistants and Nurse Practitioners) who all work together to provide you with the care you need, when you need it.  Your next appointment:   6 month(s)  The format for your next appointment:   In Person  Provider:   Buford Dresser, MD      Signed, Buford Dresser, MD PhD 05/21/2019  [place]

## 2019-05-24 ENCOUNTER — Encounter: Payer: Self-pay | Admitting: Cardiology

## 2019-06-17 ENCOUNTER — Other Ambulatory Visit: Payer: Self-pay | Admitting: Cardiology

## 2019-07-04 ENCOUNTER — Other Ambulatory Visit: Payer: Self-pay | Admitting: Cardiology

## 2019-09-18 ENCOUNTER — Other Ambulatory Visit: Payer: Self-pay | Admitting: Cardiology

## 2019-09-18 ENCOUNTER — Telehealth: Payer: Self-pay | Admitting: Cardiology

## 2019-09-18 MED ORDER — ATORVASTATIN CALCIUM 40 MG PO TABS: 40.0000 mg | ORAL_TABLET | Freq: Every day | ORAL | 0 refills | Status: DC

## 2019-09-18 NOTE — Telephone Encounter (Signed)
Patient states he is calling to inquire about whether or not he will have a lipid lab done on the same day has his appointment scheduled for 11/19/19 at 3:00 PM with Dr. Cristal Deer. Please advise.

## 2019-09-18 NOTE — Telephone Encounter (Signed)
*  STAT* If patient is at the pharmacy, call can be transferred to refill team.   1. Which medications need to be refilled? (please list name of each medication and dose if known) atorvastatin (LIPITOR) 40 MG tablet  2. Which pharmacy/location (including street and city if local pharmacy) is medication to be sent to? [id number] [address], [place] [phone]  3. Do they need a 30 day or 90 day supply? 90 day supply  Patient states he only has 1 week of medication remaining. Please assist.

## 2019-09-18 NOTE — Telephone Encounter (Signed)
Spoke with patient, advised he needed fasting lipid and could come prior to visit. He declined stating he worked nights for years and him fasting that long is not problem he eats breakfast late anyway. Suggested he come for labs go for lunch and come back. Still declined. Refilled Atorvastatin x 1

## 2019-10-01 ENCOUNTER — Other Ambulatory Visit: Payer: Self-pay | Admitting: Cardiology

## 2019-11-19 ENCOUNTER — Other Ambulatory Visit: Payer: Self-pay

## 2019-11-19 ENCOUNTER — Ambulatory Visit: Payer: Federal, State, Local not specified - PPO | Admitting: Cardiology

## 2019-11-19 ENCOUNTER — Encounter: Payer: Self-pay | Admitting: Cardiology

## 2019-11-19 VITALS — BP 99/75 | HR 74 | Ht 71.0 in | Wt 216.8 lb

## 2019-11-19 DIAGNOSIS — Z7189 Other specified counseling: Secondary | ICD-10-CM

## 2019-11-19 DIAGNOSIS — I2510 Atherosclerotic heart disease of native coronary artery without angina pectoris: Secondary | ICD-10-CM

## 2019-11-19 DIAGNOSIS — E781 Pure hyperglyceridemia: Secondary | ICD-10-CM | POA: Diagnosis not present

## 2019-11-19 DIAGNOSIS — I10 Essential (primary) hypertension: Secondary | ICD-10-CM

## 2019-11-19 NOTE — Progress Notes (Signed)
Cardiology Office Note:    Date:  11/19/2019   ID:  Jesse Hodges, DOB Sep 29, 1950, MRN [id number]  PCP:  Clayborn Heron, MD  Cardiologist:  Jodelle Red, MD PhD (previously Dr. Donnie Aho)  Referring MD: Clayborn Heron, MD   CC: follow up  History of Present Illness:    Jesse Hodges is a 69 y.o. male with a hx of CAD s/p PCI, hypertension, hyperlipidemia who is seen for follow up today. He was a new patient to me on 05/17/18/former patient of Dr. Donnie Aho at the request of Rankins, Fanny Dance, MD for the evaluation and management of CAD.  Cardiac history per Dr. Donnie Aho records: Cath 07/2014 s/p promus stent to LAD; normal LM, 90% stenosis pLAD, nl LCx, nl RCA, LVEF 60%  Today: Fasting today. Staying busy. Helping with his dad, who is 102.  Trying to stay healthy. Cooks for himself, tries to eat healthy and avoid prepackaged food. Wants to grill fish.   Tolerating medications. Doesn't check BP more than once/week. No lightheadedness other than today, because he hasn't had anything to eat since midnight.   Denies chest pain, shortness of breath at rest or with normal exertion. No PND, orthopnea, LE edema or unexpected weight gain. No syncope or palpitations.   Past Medical History:  Diagnosis Date  . Complication of anesthesia    wife states pt had a massive drop in temperature after one of his back surgeries  . Coronary artery disease 2016   1 stent  . DDD (degenerative disc disease), lumbar   . Dyslipidemia 07/16/2014  . Headache    migraines as a child  . Hypertension   . Low back pain     Past Surgical History:  Procedure Laterality Date  . BACK SURGERY     x 4 lumbar region  . CORONARY ANGIOPLASTY WITH STENT PLACEMENT  07/17/2014   "1"  . IR EPIDUROGRAPHY  02/05/2018  . LEFT HEART CATHETERIZATION WITH CORONARY ANGIOGRAM N/A 07/17/2014   Procedure: LEFT HEART CATHETERIZATION WITH CORONARY ANGIOGRAM;  Surgeon: Othella Boyer, MD;  Location: [facility] CATH LAB;   Service: Cardiovascular;  Laterality: N/A;  . LUMBAR LAMINECTOMY  ~ 1995; ~ 2000; ~ 2002; ~ 2004  . LUMBAR LAMINECTOMY/DECOMPRESSION MICRODISCECTOMY Left 02/14/2018   Procedure: Left Lumbar three-four Microdiscectomy;  Surgeon: Barnett Abu, MD;  Location: [facility] OR;  Service: Neurosurgery;  Laterality: Left;  . SHOULDER ARTHROSCOPY Right 02/2007   adhesion release    Current Medications: Current Outpatient Medications on File Prior to Visit  Medication Sig  . aspirin EC 81 MG tablet Take 81 mg by mouth daily.  Marland Kitchen atorvastatin (LIPITOR) 40 MG tablet Take 1 tablet (40 mg total) by mouth daily.  . fenofibrate 160 MG tablet TAKE 1 TABLET BY MOUTH EVERY DAY  . hydrochlorothiazide (MICROZIDE) 12.5 MG capsule Take 1 capsule (12.5 mg total) by mouth daily. Take with losartan  . ibuprofen (ADVIL,MOTRIN) 200 MG tablet Take 400 mg by mouth every 4 (four) hours as needed for moderate pain.  Marland Kitchen olmesartan (BENICAR) 5 MG tablet TAKE 1 TABLET BY MOUTH EVERY DAY   No current facility-administered medications on file prior to visit.   Allergies:   Patient has no known allergies.   Social History   Tobacco Use  . Smoking status: Never Smoker  . Smokeless tobacco: Never Used  Substance Use Topics  . Alcohol use: Yes    Alcohol/week: 0.0 standard drinks    Comment: 07/17/2014 "a glass of wine a few  times/yr"  . Drug use: No    Family History: The patient's family history includes Hypertension in his father. mother deceased from heart failure. Father has hypertension. Father alive at 62. Many relatives lived until their 90s  ROS:   Please see the history of present illness.  Additional pertinent ROS otherwise unremarkable.  EKGs/Labs/Other Studies Reviewed:    The following studies were reviewed today: Notes from Dr. Thurman Coyer office  EKG:  EKG is personally reviewed.  The ekg ordered 05/27/19 demonstrates normal sinus rhythm  Recent Labs: No results found for requested labs within last 8760  hours.  Recent Lipid Panel    Component Value Date/Time   CHOL 130 11/08/2018 1030   TRIG 220 (H) 11/08/2018 1030   HDL 25 (L) 11/08/2018 1030   CHOLHDL 5.2 (H) 11/08/2018 1030   LDLCALC 61 11/08/2018 1030    Physical Exam:    VS:  BP 99/75   Pulse 74   Ht 5\' 11"  (1.803 m)   Wt 216 lb 12.8 oz (98.3 kg)   SpO2 100%   BMI 30.24 kg/m     Wt Readings from Last 3 Encounters:  11/19/19 216 lb 12.8 oz (98.3 kg)  05/21/19 221 lb (100.2 kg)  05/17/18 208 lb (94.3 kg)    GEN: Well nourished, well developed in no acute distress HEENT: Normal, moist mucous membranes NECK: No JVD CARDIAC: regular rhythm, normal S1 and S2, no rubs or gallops. No murmur. VASCULAR: Radial and DP pulses 2+ bilaterally. No carotid bruits RESPIRATORY:  Clear to auscultation without rales, wheezing or rhonchi  ABDOMEN: Soft, non-tender, non-distended MUSCULOSKELETAL:  Ambulates independently SKIN: Warm and dry, no edema NEUROLOGIC:  Alert and oriented x 3. No focal neuro deficits noted. PSYCHIATRIC:  Normal affect   ASSESSMENT:    1. Coronary artery disease involving native coronary artery of native heart without angina pectoris   2. Essential hypertension   3. Hypertriglyceridemia   4. Counseling on health promotion and disease prevention   5. Cardiac risk counseling    PLAN:   CAD s/p prior LAD PCI in 2016:  -no angina -Aspirin 81 mg -statin: atorvastatin 40 mg daily, tolerating. Check lipids today, fasting -LDL goal <70, last LDL 61 -antianginals: none required -counseled on diet recommendations and AHA exercise guidelines, below -risk factor modification, as below -counseled on red flag warning signs that need immediate medical attention  Hypertension: goal <130/80 -well controlled on olmesartan and HCTZ. Low today but has been NPO. Usually not low. Instructed to monitor at home, call if remains <694 systolic.  Hypertriglyceridemia: he notes this for many years, with number well over 1000  in the past. No history of pancreatitis.  -attempted vascepa with good drop in TG (though did not get below 150). However, the cost of this varied for him.  -changed to fibrate. No side effects noted (given that he is on high intensity statin) -last TG 330, not at goal -he notes intermittent exercise/less than ideal diet. He would like to work on this -recheck fasting lipids today  Secondary prevention -recommend heart healthy/Mediterranean diet, with whole grains, fruits, vegetable, fish, lean meats, nuts, and olive oil. Limit salt. -recommend moderate walking, 3-5 times/week for 30-50 minutes each session. Aim for at least 150 minutes.week. Goal should be pace of 3 miles/hours, or walking 1.5 miles in 30 minutes -recommend avoidance of tobacco products. Avoid excess alcohol. -Additional risk factor control:  -Diabetes: A1c is 5.8, no history of diabetes  -Lipids: as above  -Blood pressure  control: as above  -Weight: BMI 30 from 27, working on weight loss/diet/exercse  Plan for follow up: 1 year.  Medication Adjustments/Labs and Tests Ordered: Current medicines are reviewed at length with the patient today.  Concerns regarding medicines are outlined above.  No orders of the defined types were placed in this encounter.  No orders of the defined types were placed in this encounter.   Patient Instructions  Medication Instructions:  Your Physician recommend you continue on your current medication as directed.    *If you need a refill on your cardiac medications before your next appointment, please call your pharmacy*   Lab Work: None   Testing/Procedures: None   Follow-Up: At [facility], you and your health needs are our priority.  As part of our continuing mission to provide you with exceptional heart care, we have created designated Provider Care Teams.  These Care Teams include your primary Cardiologist (physician) and Advanced Practice Providers (APPs -  Physician  Assistants and Nurse Practitioners) who all work together to provide you with the care you need, when you need it.  We recommend signing up for the patient portal called "MyChart".  Sign up information is provided on this After Visit Summary.  MyChart is used to connect with patients for Virtual Visits (Telemedicine).  Patients are able to view lab/test results, encounter notes, upcoming appointments, etc.  Non-urgent messages can be sent to your provider as well.   To learn more about what you can do with MyChart, go to [email or web address].    Your next appointment:   1 year(s)  The format for your next appointment:   In Person  Provider:   Jodelle Red, MD       Signed, Jodelle Red, MD PhD 11/19/2019  [facility]

## 2019-11-19 NOTE — Patient Instructions (Signed)

## 2019-11-20 LAB — LIPID PANEL
Chol/HDL Ratio: 5.3 ratio — ABNORMAL HIGH (ref 0.0–5.0)
Cholesterol, Total: 144 mg/dL (ref 100–199)
HDL: 27 mg/dL — ABNORMAL LOW (ref >39)
LDL Chol Calc (NIH): 78 mg/dL (ref 0–99)
Triglycerides: 235 mg/dL — ABNORMAL HIGH (ref 0–149)
VLDL Cholesterol Cal: 39 mg/dL (ref 5–40)

## 2019-11-26 ENCOUNTER — Encounter: Payer: Self-pay | Admitting: Cardiology

## 2019-12-02 ENCOUNTER — Telehealth: Payer: Self-pay | Admitting: Cardiology

## 2019-12-02 NOTE — Telephone Encounter (Signed)
New Message    Pt is calling back for results  Call transferred to [facility]

## 2019-12-25 ENCOUNTER — Other Ambulatory Visit: Payer: Self-pay | Admitting: Cardiology

## 2019-12-26 ENCOUNTER — Other Ambulatory Visit: Payer: Self-pay | Admitting: Cardiology

## 2020-01-27 ENCOUNTER — Other Ambulatory Visit: Payer: Self-pay | Admitting: Cardiology

## 2020-01-30 ENCOUNTER — Other Ambulatory Visit: Payer: Self-pay | Admitting: Cardiology

## 2020-03-08 IMAGING — CR DG LUMBAR SPINE 2-3V
2 series · 2 of 2 positions shown · non-contrast
Comparison: 02/05/2018.

CLINICAL DATA: Lumbar spine surgery.

EXAM:
LUMBAR SPINE - 2-3 VIEW

[xtable lateral (1 of 2)]
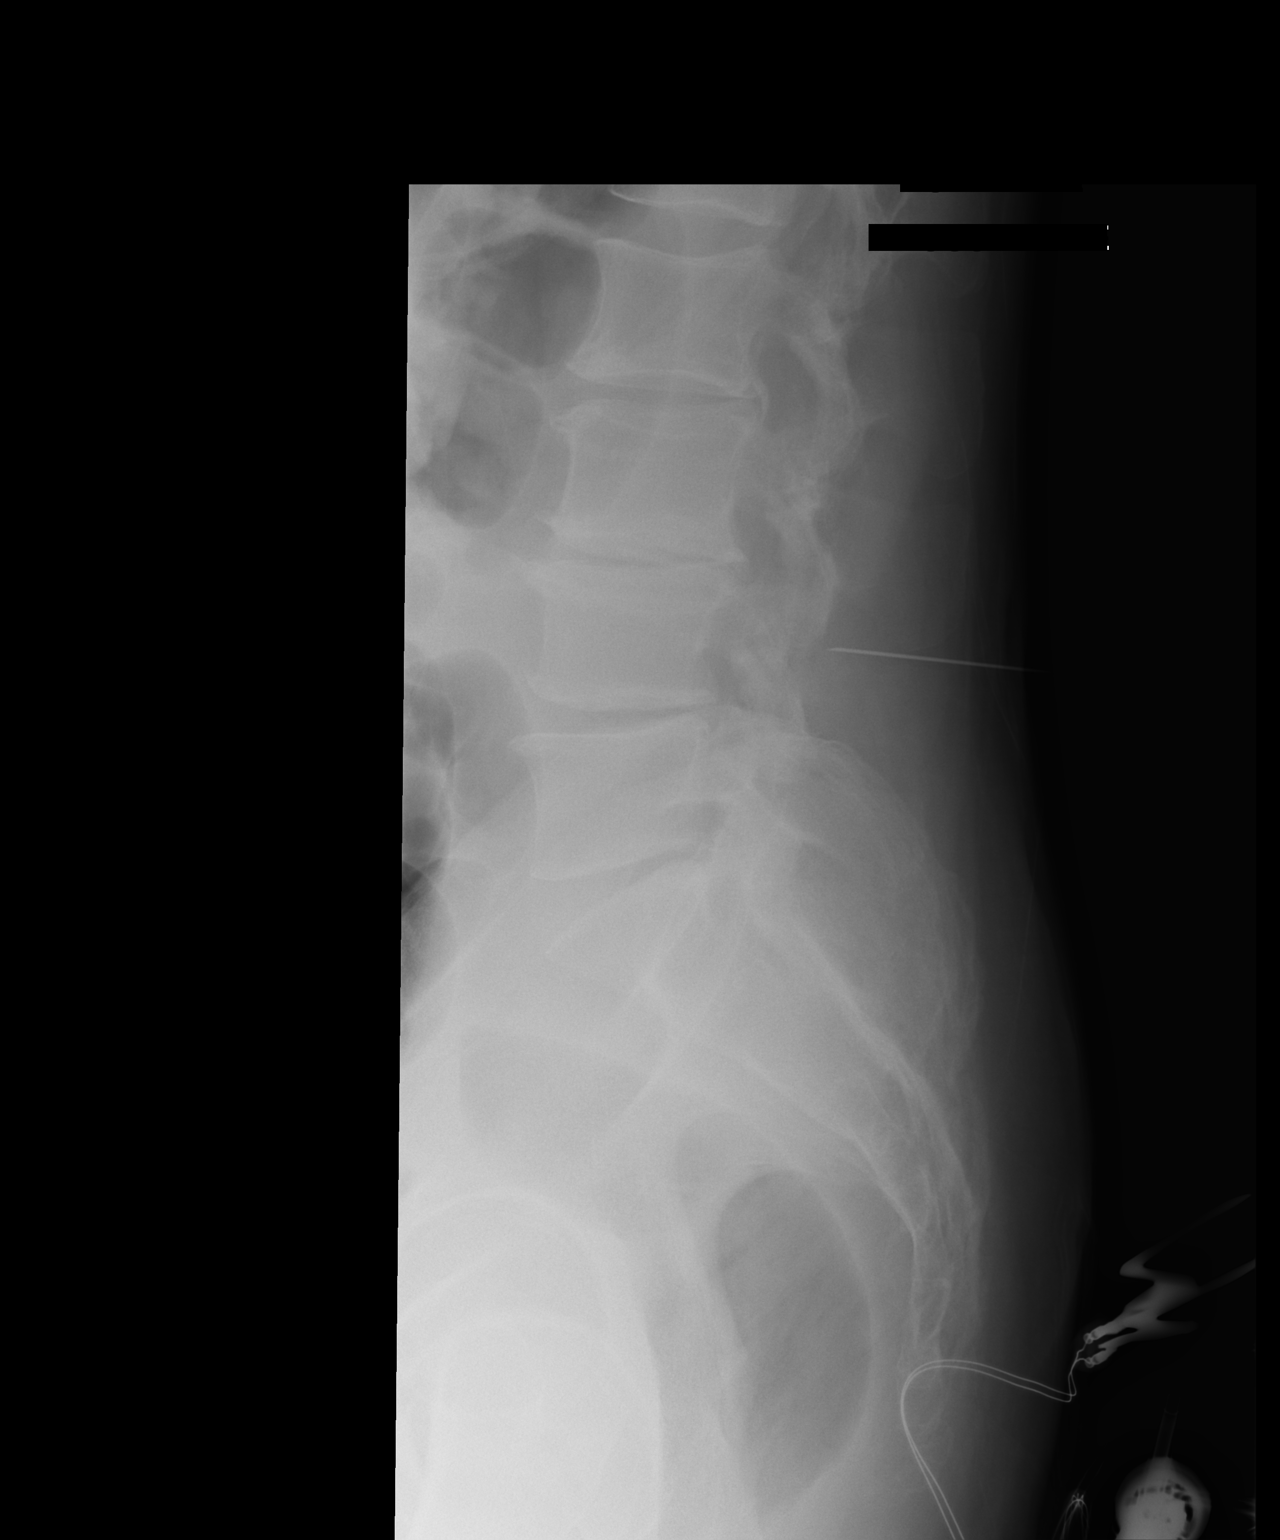

[xtable lateral (2 of 2)]
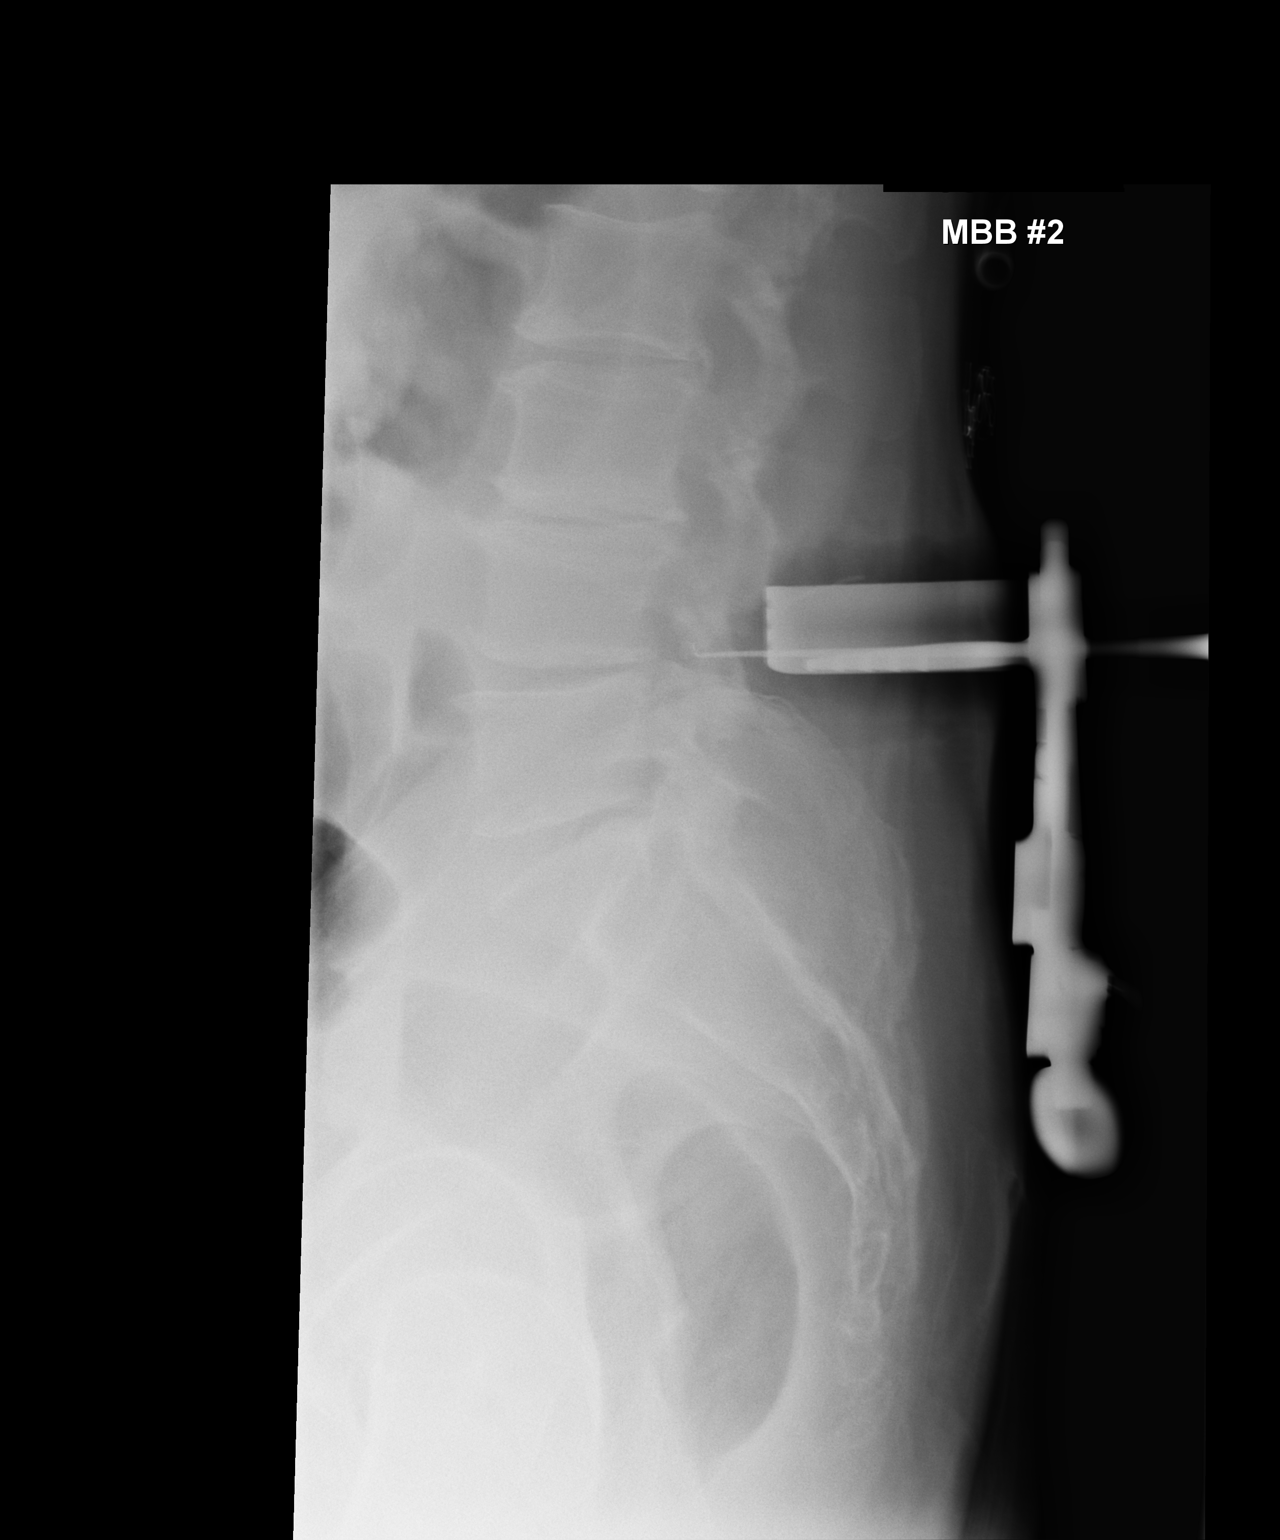

[2 of 2 positions shown; findings below may reference images not displayed]

FINDINGS: Lumbar spine numbered as per prior CT. On image number 2 metallic
marker noted posteriorly at the L4-L5 level.
IMPRESSION: On image number 2 metallic marker noted posteriorly at the L4-L5
level.

## 2020-05-16 ENCOUNTER — Other Ambulatory Visit: Payer: Self-pay | Admitting: Cardiology

## 2020-09-01 ENCOUNTER — Other Ambulatory Visit: Payer: Self-pay | Admitting: Cardiology

## 2021-02-28 ENCOUNTER — Encounter (HOSPITAL_BASED_OUTPATIENT_CLINIC_OR_DEPARTMENT_OTHER): Payer: Self-pay | Admitting: Cardiology

## 2021-02-28 ENCOUNTER — Ambulatory Visit (INDEPENDENT_AMBULATORY_CARE_PROVIDER_SITE_OTHER): Payer: Federal, State, Local not specified - PPO | Admitting: Cardiology

## 2021-02-28 ENCOUNTER — Other Ambulatory Visit: Payer: Self-pay

## 2021-02-28 VITALS — BP 134/82 | HR 66 | Ht 71.0 in | Wt 220.0 lb

## 2021-02-28 DIAGNOSIS — I10 Essential (primary) hypertension: Secondary | ICD-10-CM | POA: Diagnosis not present

## 2021-02-28 DIAGNOSIS — I2510 Atherosclerotic heart disease of native coronary artery without angina pectoris: Secondary | ICD-10-CM

## 2021-02-28 DIAGNOSIS — Z7189 Other specified counseling: Secondary | ICD-10-CM | POA: Diagnosis not present

## 2021-02-28 DIAGNOSIS — E781 Pure hyperglyceridemia: Secondary | ICD-10-CM | POA: Diagnosis not present

## 2021-02-28 NOTE — Patient Instructions (Signed)

## 2021-02-28 NOTE — Progress Notes (Signed)
Cardiology Office Note:    Date:  02/28/2021   ID:  Jesse Hodges, DOB 03-07-1951, MRN [id number]  PCP:  Jesse Heron, MD  Cardiologist:  Jesse Red, MD PhD (previously Dr. Donnie Hodges)  Referring MD: Jesse Heron, MD   CC: follow up  History of Present Illness:    Jesse Hodges is a 70 y.o. male with a hx of CAD s/p PCI, hypertension, hyperlipidemia who is seen for follow up today. He was a new patient to me on 05/17/18 former patient of Dr. Donnie Hodges at the request of Rankins, Jesse Dance, MD for the evaluation and management of CAD.  Cardiac history per Dr. Donnie Hodges records: Cath 07/2014 s/p promus stent to LAD; normal LM, 90% stenosis pLAD, nl LCx, nl RCA, LVEF 60%  Today: While working outside in the yard he generally becomes short of breath, particularly in the hot weather. If he exerts too much, he does have some chest discomfort, but he is not overly concerned about this. He denies any chest pain at rest.  Occasionally he also has bilateral knee pain. He will sometimes wake up in the middle of the night with a burning sensation in his knees.  He is no longer taking losartan or the HCTZ. He is now on olmesartan.  He denies any palpitations. No lightheadedness, headaches, syncope, orthopnea, or PND. Also has no lower extremity edema.   Past Medical History:  Diagnosis Date   Complication of anesthesia    wife states pt had a massive drop in temperature after one of his back surgeries   Coronary artery disease 2016   1 stent   DDD (degenerative disc disease), lumbar    Dyslipidemia 07/16/2014   Headache    migraines as a child   Hypertension    Low back pain     Past Surgical History:  Procedure Laterality Date   BACK SURGERY     x 4 lumbar region   CORONARY ANGIOPLASTY WITH STENT PLACEMENT  07/17/2014   "1"   IR EPIDUROGRAPHY  02/05/2018   LEFT HEART CATHETERIZATION WITH CORONARY ANGIOGRAM N/A 07/17/2014   Procedure: LEFT HEART CATHETERIZATION WITH  CORONARY ANGIOGRAM;  Surgeon: Jesse Boyer, MD;  Location: [facility] CATH LAB;  Service: Cardiovascular;  Laterality: N/A;   LUMBAR LAMINECTOMY  ~ 1995; ~ 2000; ~ 2002; ~ 2004   LUMBAR LAMINECTOMY/DECOMPRESSION MICRODISCECTOMY Left 02/14/2018   Procedure: Left Lumbar three-four Microdiscectomy;  Surgeon: Barnett Abu, MD;  Location: [facility] OR;  Service: Neurosurgery;  Laterality: Left;   SHOULDER ARTHROSCOPY Right 02/2007   adhesion release    Current Medications: Current Outpatient Medications on File Prior to Visit  Medication Sig   aspirin EC 81 MG tablet Take 81 mg by mouth daily.   atorvastatin (LIPITOR) 40 MG tablet TAKE 1 TABLET BY MOUTH EVERY DAY   fenofibrate 160 MG tablet TAKE 1 TABLET BY MOUTH EVERY DAY   olmesartan (BENICAR) 5 MG tablet TAKE 1 TABLET BY MOUTH EVERY DAY   hydrochlorothiazide (MICROZIDE) 12.5 MG capsule TAKE 1 CAPSULE (12.5 MG TOTAL) BY MOUTH DAILY. TAKE WITH LOSARTAN (Patient not taking: Reported on 02/28/2021)   ibuprofen (ADVIL,MOTRIN) 200 MG tablet Take 400 mg by mouth every 4 (four) hours as needed for moderate pain. (Patient not taking: Reported on 02/28/2021)   No current facility-administered medications on file prior to visit.   Allergies:   Patient has no known allergies.   Social History   Tobacco Use   Smoking status: Never   Smokeless  tobacco: Never  Vaping Use   Vaping Use: Never used  Substance Use Topics   Alcohol use: Yes    Alcohol/week: 0.0 standard drinks    Comment: 07/17/2014 "a glass of wine a few times/yr"   Drug use: No    Family History: The patient's family history includes Hypertension in his father. mother deceased from heart failure. Father has hypertension. Father alive at 70. Many relatives lived until their 90s  ROS:   Please see the history of present illness.   (+) Shortness of breath (+) minor exertional chest discomfort (+) Bilateral knee pain/burning sensation Additional pertinent ROS otherwise  unremarkable.  EKGs/Labs/Other Studies Reviewed:    The following studies were reviewed today: Notes from Dr. York Hodges office  EKG:  EKG is personally reviewed.   02/28/2021: not performed 05/27/19: normal sinus rhythm  Recent Labs: No results found for requested labs within last 8760 hours.  Recent Lipid Panel    Component Value Date/Time   CHOL 144 11/19/2019 1530   TRIG 235 (H) 11/19/2019 1530   HDL 27 (L) 11/19/2019 1530   CHOLHDL 5.3 (H) 11/19/2019 1530   LDLCALC 78 11/19/2019 1530    Physical Exam:    VS:  BP 134/82   Pulse 66   Ht 5\' 11"  (1.803 m)   Wt 220 lb (99.8 kg)   SpO2 95%   BMI 30.68 kg/m     Wt Readings from Last 3 Encounters:  02/28/21 220 lb (99.8 kg)  11/19/19 216 lb 12.8 oz (98.3 kg)  05/21/19 221 lb (100.2 kg)    GEN: Well nourished, well developed in no acute distress HEENT: Normal, moist mucous membranes NECK: No JVD CARDIAC: regular rhythm, normal S1 and S2, no rubs or gallops. No murmur. VASCULAR: Radial and DP pulses 2+ bilaterally. No carotid bruits RESPIRATORY:  Clear to auscultation without rales, wheezing or rhonchi  ABDOMEN: Soft, non-tender, non-distended MUSCULOSKELETAL:  Ambulates independently SKIN: Warm and dry, no edema NEUROLOGIC:  Alert and oriented x 3. No focal neuro deficits noted. PSYCHIATRIC:  Normal affect    ASSESSMENT:    1. Coronary artery disease involving native coronary artery of native heart without angina pectoris   2. Essential hypertension   3. Hypertriglyceridemia   4. Counseling on health promotion and disease prevention   5. Cardiac risk counseling     PLAN:   CAD s/p prior LAD PCI in 2016:  -no angina -Aspirin 81 mg -statin: atorvastatin 40 mg daily, tolerating.  -LDL goal <70, last LDL 78 per [facility], dated 11/19/2019 -antianginals: none required -counseled on diet recommendations and AHA exercise guidelines, below -risk factor modification, as below -counseled on Hodges flag warning signs that need  immediate medical attention  Hypertension: goal <130/80 -near goal in office on olmesartan. Has been well controlled to low in the past. No longer on HCTZ  Hypertriglyceridemia: he notes this for many years, with number well over 1000 in the past. No history of pancreatitis.  -attempted vascepa with good drop in TG (though did not get below 150). However, the cost of this varied for him.  -changed to fibrate. No side effects noted (given that he is on high intensity statin) -last TG 235, not at goal -he notes intermittent exercise/less than ideal diet. He would like to work on this -recheck fasting lipids at next visit if not done prior  Secondary prevention -recommend heart healthy/Mediterranean diet, with whole grains, fruits, vegetable, fish, lean meats, nuts, and olive oil. Limit salt. -recommend moderate walking, 3-5  times/week for 30-50 minutes each session. Aim for at least 150 minutes.week. Goal should be pace of 3 miles/hours, or walking 1.5 miles in 30 minutes -recommend avoidance of tobacco products. Avoid excess alcohol.  Plan for follow up: 1 year or sooner as needed  Medication Adjustments/Labs and Tests Ordered: Current medicines are reviewed at length with the patient today.  Concerns regarding medicines are outlined above.  No orders of the defined types were placed in this encounter.  No orders of the defined types were placed in this encounter.   Patient Instructions  Medication Instructions:  Your Physician recommend you continue on your current medication as directed.    *If you need a refill on your cardiac medications before your next appointment, please call your pharmacy*   Lab Work: None ordered today   Testing/Procedures: None ordered today   Follow-Up: At [facility], you and your health needs are our priority.  As part of our continuing mission to provide you with exceptional heart care, we have created designated Provider Care Teams.  These  Care Teams include your primary Cardiologist (physician) and Advanced Practice Providers (APPs -  Physician Assistants and Nurse Practitioners) who all work together to provide you with the care you need, when you need it.  We recommend signing up for the patient portal called "MyChart".  Sign up information is provided on this After Visit Summary.  MyChart is used to connect with patients for Virtual Visits (Telemedicine).  Patients are able to view lab/test results, encounter notes, upcoming appointments, etc.  Non-urgent messages can be sent to your provider as well.   To learn more about what you can do with MyChart, go to [email or web address].    Your next appointment:   1 year(s)  The format for your next appointment:   In Person  Provider:   Jodelle Red, MD     [facility] Stumpf,acting as a scribe for Jesse Red, MD.,have documented all relevant documentation on the behalf of Jesse Red, MD,as directed by  Jesse Red, MD while in the presence of Jesse Red, MD.  I, Jesse Red, MD, have reviewed all documentation for this visit. The documentation on 03/02/21 for the exam, diagnosis, procedures, and orders are all accurate and complete.   Signed, Jesse Red, MD PhD 02/28/2021  [facility]

## 2021-03-01 ENCOUNTER — Other Ambulatory Visit (HOSPITAL_BASED_OUTPATIENT_CLINIC_OR_DEPARTMENT_OTHER): Payer: Self-pay | Admitting: Cardiology

## 2021-03-02 ENCOUNTER — Other Ambulatory Visit: Payer: Self-pay | Admitting: Cardiology

## 2021-03-02 NOTE — Telephone Encounter (Signed)
Rx(s) sent to pharmacy electronically.  

## 2021-03-02 NOTE — Addendum Note (Signed)
Addended by: Jodelle Red A on: 03/02/2021 04:19 PM   Modules accepted: Orders

## 2021-03-06 ENCOUNTER — Other Ambulatory Visit: Payer: Self-pay | Admitting: Cardiology

## 2021-04-11 IMAGING — US US ABDOMEN LIMITED
1 series · 14 of 25 positions shown · non-contrast
Comparison: None.

CLINICAL DATA: Elevated LFTs

EXAM:
ULTRASOUND ABDOMEN LIMITED RIGHT UPPER QUADRANT

[Series 1: us abdomen limited · 0.17mm/px · 14 of 47 slices shown]
[im 1/47]
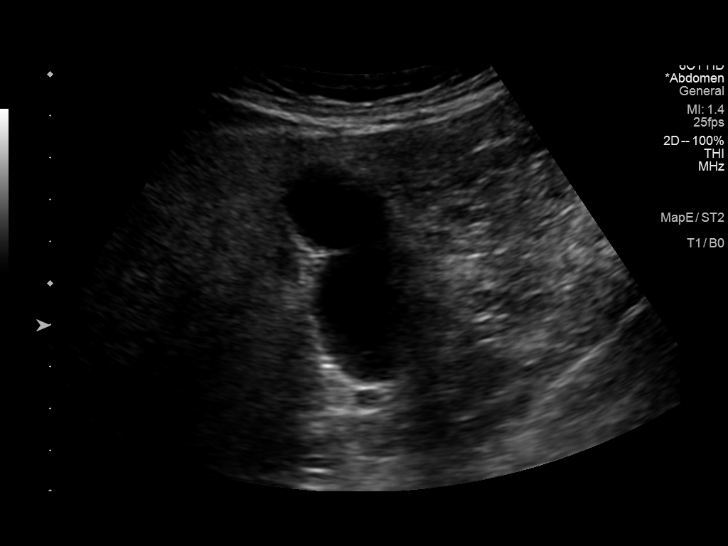
[im 4/47]
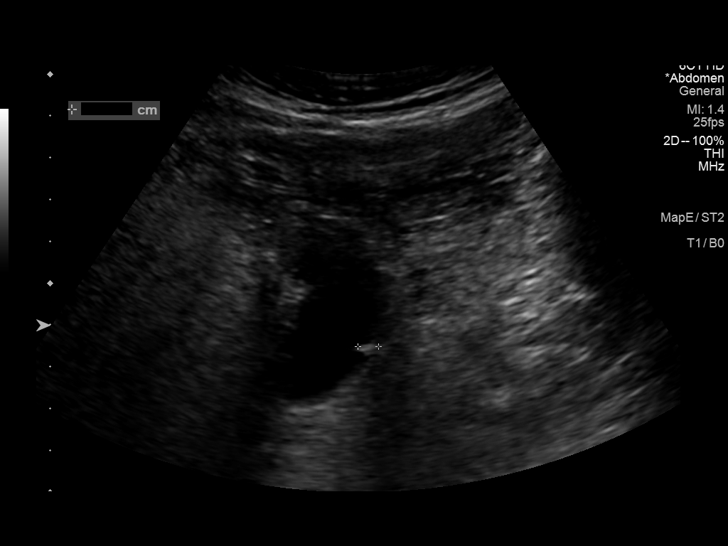
[im 8/47]
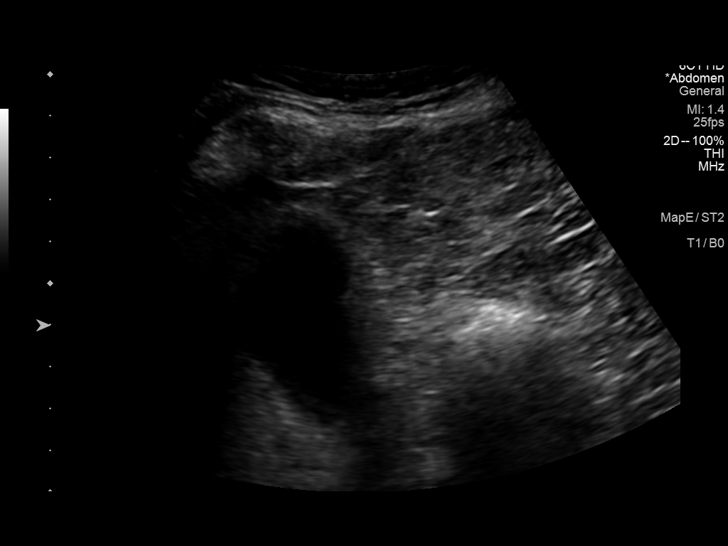
[im 12/47]
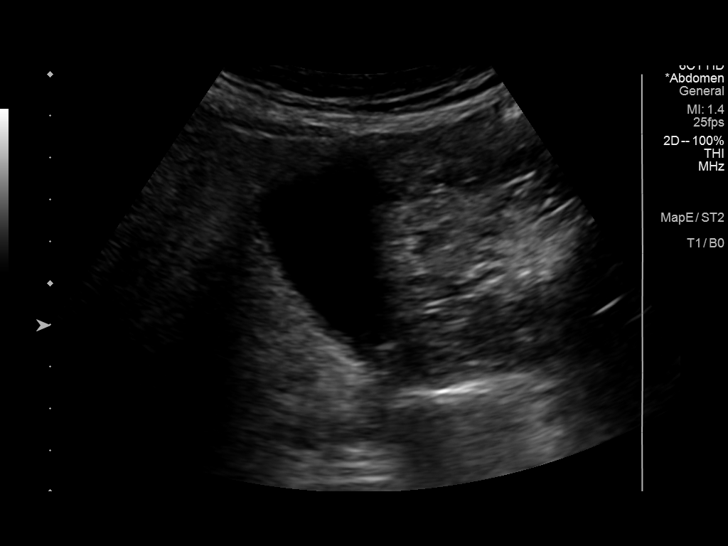
[im 16/47]
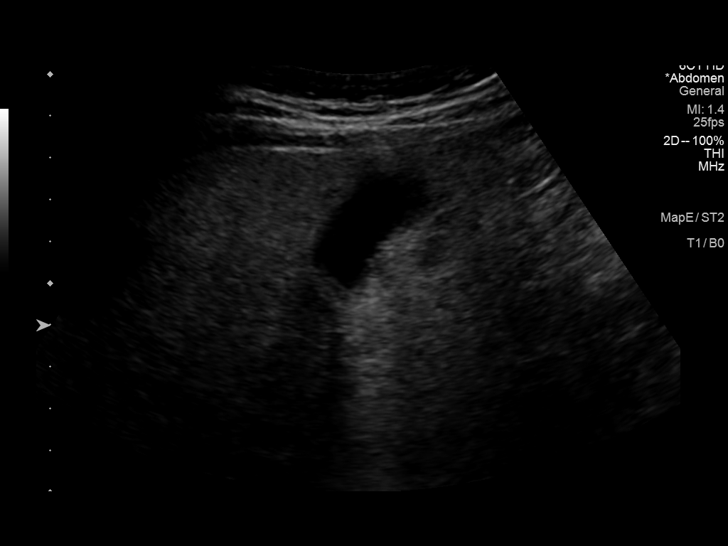
[im 18/47]
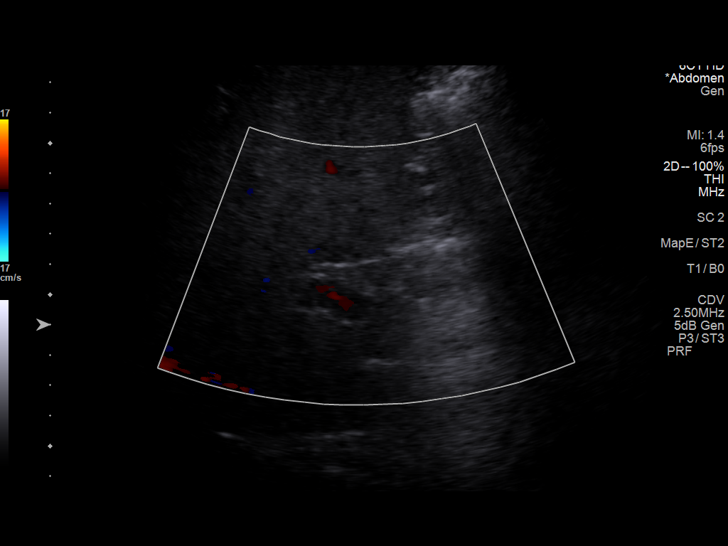
[im 22/47]
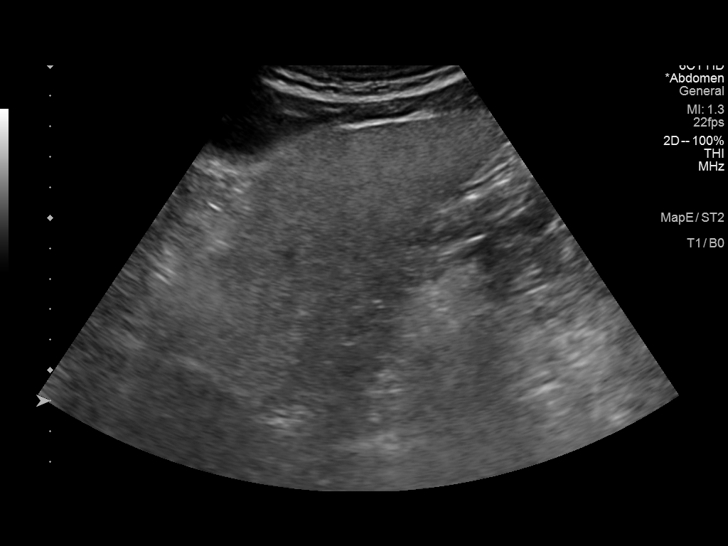
[im 25/47]
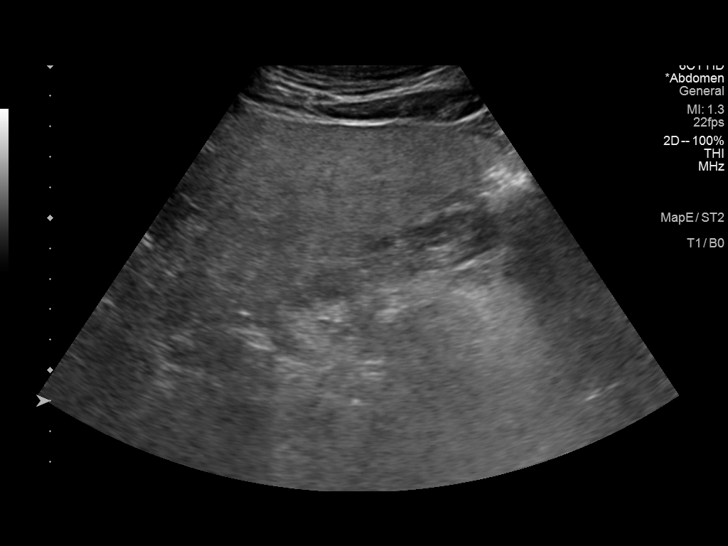
[im 29/47]
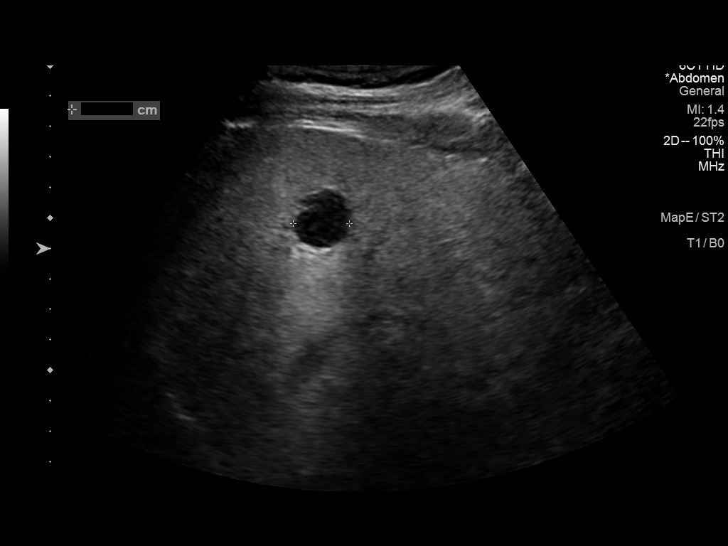
[im 31/47]
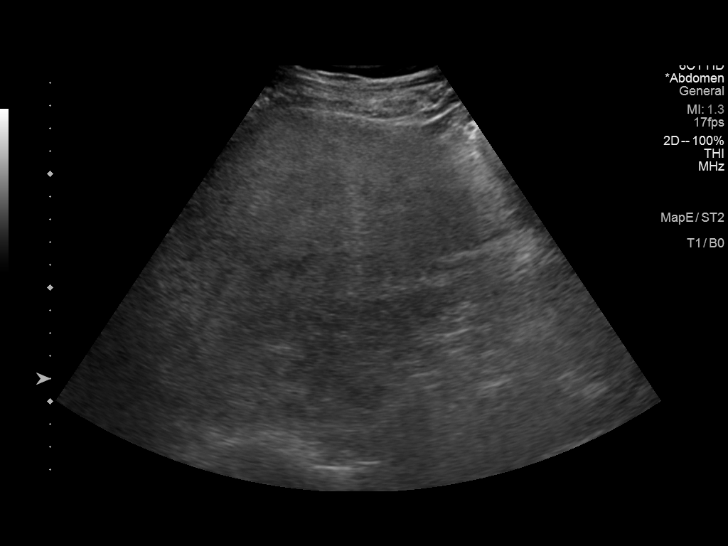
[im 35/47]
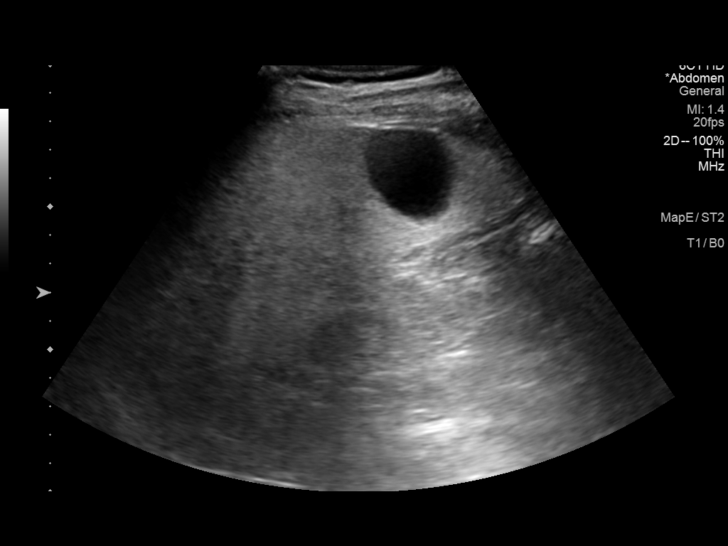
[im 39/47]
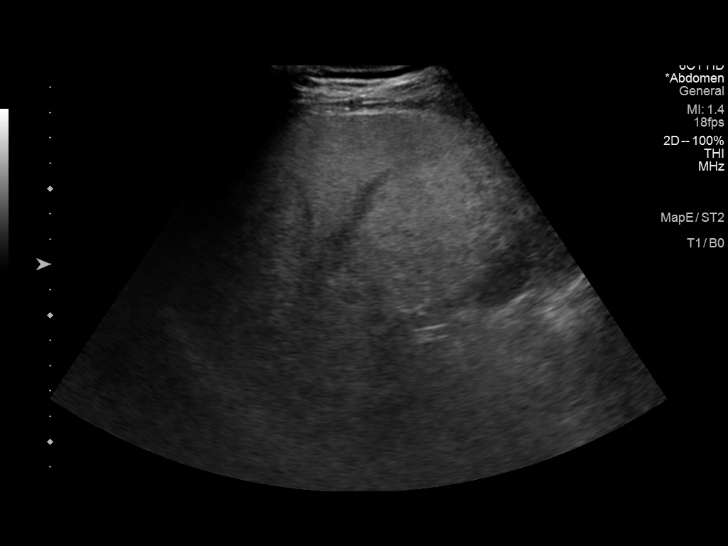
[im 43/47]
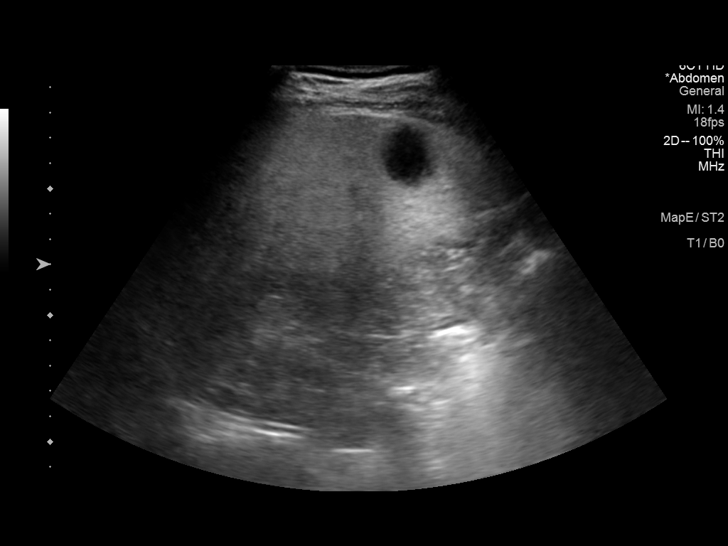
[im 47/47]
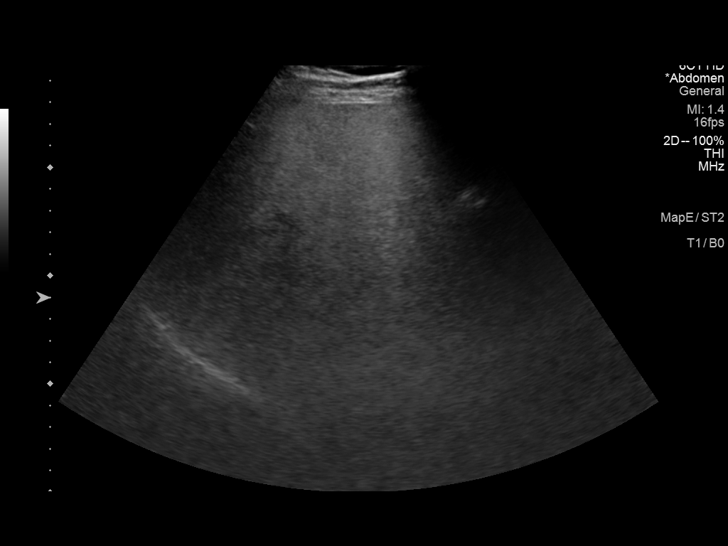

[14 of 25 positions shown; findings below may reference images not displayed]

FINDINGS: Gallbladder:

No stones, wall thickening, or pericholecystic fluid. No Murphy's
sign. A 5 mm polyp is seen in the gallbladder.

Common bile duct:

Diameter: 4.8 mm

Liver:

Contains 2 cysts with the largest measuring 3.6 cm. Increased
echogenicity is seen diffusely. Portal vein is patent on color
Doppler imaging with normal direction of blood flow towards the
liver.

Other: None.
IMPRESSION: 1. Increased echogenicity in the liver is nonspecific but often seen
with hepatic steatosis.
2. Two liver cysts are noted.
3. There is a 5 mm polyp in the gallbladder of no significance, not
requiring follow-up.

## 2022-01-12 ENCOUNTER — Other Ambulatory Visit: Payer: Self-pay | Admitting: Cardiology

## 2022-01-12 NOTE — Telephone Encounter (Signed)
Rx request sent to pharmacy.  

## 2022-03-15 ENCOUNTER — Other Ambulatory Visit: Payer: Self-pay | Admitting: Cardiology

## 2022-03-16 ENCOUNTER — Ambulatory Visit (HOSPITAL_BASED_OUTPATIENT_CLINIC_OR_DEPARTMENT_OTHER): Payer: Federal, State, Local not specified - PPO | Admitting: Cardiology

## 2022-03-16 ENCOUNTER — Encounter (HOSPITAL_BASED_OUTPATIENT_CLINIC_OR_DEPARTMENT_OTHER): Payer: Self-pay | Admitting: Cardiology

## 2022-03-16 VITALS — BP 112/72 | HR 73 | Ht 71.0 in | Wt 226.4 lb

## 2022-03-16 DIAGNOSIS — E782 Mixed hyperlipidemia: Secondary | ICD-10-CM

## 2022-03-16 DIAGNOSIS — Z7189 Other specified counseling: Secondary | ICD-10-CM

## 2022-03-16 DIAGNOSIS — Z955 Presence of coronary angioplasty implant and graft: Secondary | ICD-10-CM

## 2022-03-16 DIAGNOSIS — I2510 Atherosclerotic heart disease of native coronary artery without angina pectoris: Secondary | ICD-10-CM | POA: Diagnosis not present

## 2022-03-16 DIAGNOSIS — I10 Essential (primary) hypertension: Secondary | ICD-10-CM

## 2022-03-16 DIAGNOSIS — Z79899 Other long term (current) drug therapy: Secondary | ICD-10-CM

## 2022-03-16 NOTE — Telephone Encounter (Signed)
Pt is seeing Dr. Cristal Deer today at 1:20. Will you send these in for him if there are no changes?

## 2022-03-16 NOTE — Patient Instructions (Signed)
Medication Instructions:  Your Physician recommend you continue on your current medication as directed.    *If you need a refill on your cardiac medications before your next appointment, please call your pharmacy*   Lab Work: Your physician recommends that you return for lab work (fasting lipid, BMP)  If you have labs (blood work) drawn today and your tests are completely normal, you will receive your results only by: MyChart Message (if you have MyChart) OR A paper copy in the mail If you have any lab test that is abnormal or we need to change your treatment, we will call you to review the results.   Testing/Procedures: None ordered today   Follow-Up: At [facility], you and your health needs are our priority.  As part of our continuing mission to provide you with exceptional heart care, we have created designated Provider Care Teams.  These Care Teams include your primary Cardiologist (physician) and Advanced Practice Providers (APPs -  Physician Assistants and Nurse Practitioners) who all work together to provide you with the care you need, when you need it.  We recommend signing up for the patient portal called "MyChart".  Sign up information is provided on this After Visit Summary.  MyChart is used to connect with patients for Virtual Visits (Telemedicine).  Patients are able to view lab/test results, encounter notes, upcoming appointments, etc.  Non-urgent messages can be sent to your provider as well.   To learn more about what you can do with MyChart, go to [email or web address].    Your next appointment:   1 year(s)  The format for your next appointment:   In Person  Provider:   Jodelle Red, MD

## 2022-03-16 NOTE — Progress Notes (Signed)
Cardiology Office Note:    Date:  03/16/2022   ID:  Jesse Hodges, DOB 1951-02-16, MRN [id number]  PCP:  Clayborn Heron, MD  Cardiologist:  Jodelle Red, MD PhD (previously Dr. Donnie Aho)  Referring MD: Clayborn Heron, MD   CC: follow up  History of Present Illness:    Jesse Hodges is a 71 y.o. male with a hx of CAD s/p PCI, hypertension, hyperlipidemia who is seen for follow up today. He was a new patient to me on 05/17/18 former patient of Dr. Donnie Aho at the request of Rankins, Fanny Dance, MD for the evaluation and management of CAD.  Cardiac history per Dr. Donnie Aho records: Cath 07/2014 s/p promus stent to LAD; normal LM, 90% stenosis pLAD, nl LCx, nl RCA, LVEF 60%  At his last appointment he reported DOE while working outside in the hot weather. He also complained of chest discomfort with too much exertion, but never at rest. He was no longer taking losartan or HCTZ, but was taking olmesartan.  Today: Overall he is feeling good today. He is concerned about his medications due to problems with his pancreas function.   He has never been tested for diabetes. His wife has diabetes and she has done a blood glucose level at home for him about every 6 months. Most recently his blood glucose was reading 105.   He has been dealing with his back pain still. He has a history of chronic back pain and he has had two surgeries on his back.   He is able to get out and do yard work when it is not too hot. In cooler temperatures he has not had any issues doing his lawn work. When he is mowing it is with a push mower. He denies any chest pain or shortness of breath while doing it.   He is averaging 4 cups of coffee per day with no ill efects.   He denies any palpitations, chest pain, shortness of breath, or peripheral edema. No lightheadedness, headaches, syncope, orthopnea, or PND.    Past Medical History:  Diagnosis Date   Complication of anesthesia    wife states pt had a  massive drop in temperature after one of his back surgeries   Coronary artery disease 2016   1 stent   DDD (degenerative disc disease), lumbar    Dyslipidemia 07/16/2014   Headache    migraines as a child   Hypertension    Low back pain     Past Surgical History:  Procedure Laterality Date   BACK SURGERY     x 4 lumbar region   CORONARY ANGIOPLASTY WITH STENT PLACEMENT  07/17/2014   "1"   IR EPIDUROGRAPHY  02/05/2018   LEFT HEART CATHETERIZATION WITH CORONARY ANGIOGRAM N/A 07/17/2014   Procedure: LEFT HEART CATHETERIZATION WITH CORONARY ANGIOGRAM;  Surgeon: Othella Boyer, MD;  Location: [facility] CATH LAB;  Service: Cardiovascular;  Laterality: N/A;   LUMBAR LAMINECTOMY  ~ 1995; ~ 2000; ~ 2002; ~ 2004   LUMBAR LAMINECTOMY/DECOMPRESSION MICRODISCECTOMY Left 02/14/2018   Procedure: Left Lumbar three-four Microdiscectomy;  Surgeon: Barnett Abu, MD;  Location: [facility] OR;  Service: Neurosurgery;  Laterality: Left;   SHOULDER ARTHROSCOPY Right 02/2007   adhesion release    Current Medications: Current Outpatient Medications on File Prior to Visit  Medication Sig   aspirin EC 81 MG tablet Take 81 mg by mouth daily.   atorvastatin (LIPITOR) 40 MG tablet Take 1 tablet (40 mg total) by mouth daily. Please  call to schedule 1 year appointment with Dr. Cristal Deer.   fenofibrate 160 MG tablet TAKE 1 TABLET BY MOUTH EVERY DAY   olmesartan (BENICAR) 5 MG tablet TAKE 1 TABLET BY MOUTH EVERY DAY   No current facility-administered medications on file prior to visit.   Allergies:   Patient has no known allergies.   Social History   Tobacco Use   Smoking status: Never   Smokeless tobacco: Never  Vaping Use   Vaping Use: Never used  Substance Use Topics   Alcohol use: Yes    Alcohol/week: 0.0 standard drinks of alcohol    Comment: 07/17/2014 "a glass of wine a few times/yr"   Drug use: No    Family History: The patient's family history includes Hypertension in his father. mother deceased from  heart failure. Father has hypertension. Father alive at 9. Many relatives lived until their 90s  ROS:   Please see the history of present illness.   Additional pertinent ROS otherwise unremarkable.  EKGs/Labs/Other Studies Reviewed:    The following studies were reviewed today: Notes from Dr. York Spaniel office  EKG:  EKG is personally reviewed.   03/16/2022:  *** 02/28/2021: not performed 05/27/19: normal sinus rhythm  Recent Labs: No results found for requested labs within last 365 days.  Recent Lipid Panel    Component Value Date/Time   CHOL 144 11/19/2019 1530   TRIG 235 (H) 11/19/2019 1530   HDL 27 (L) 11/19/2019 1530   CHOLHDL 5.3 (H) 11/19/2019 1530   LDLCALC 78 11/19/2019 1530    Physical Exam:    VS:  BP 112/72 (BP Location: Left Arm, Patient Position: Sitting, Cuff Size: Large)   Pulse 73   Ht 5\' 11"  (1.803 m)   Wt 226 lb 6.4 oz (102.7 kg)   BMI 31.58 kg/m     Wt Readings from Last 3 Encounters:  03/16/22 226 lb 6.4 oz (102.7 kg)  02/28/21 220 lb (99.8 kg)  11/19/19 216 lb 12.8 oz (98.3 kg)    GEN: Well nourished, well developed in no acute distress HEENT: Normal, moist mucous membranes NECK: No JVD CARDIAC: regular rhythm, normal S1 and S2, no rubs or gallops. No murmur. VASCULAR: Radial and DP pulses 2+ bilaterally. No carotid bruits RESPIRATORY:  Clear to auscultation without rales, wheezing or rhonchi  ABDOMEN: Soft, non-tender, non-distended MUSCULOSKELETAL:  Ambulates independently SKIN: Warm and dry, no edema NEUROLOGIC:  Alert and oriented x 3. No focal neuro deficits noted. PSYCHIATRIC:  Normal affect    ASSESSMENT:    1. Coronary artery disease involving native coronary artery of native heart without angina pectoris   2. Essential hypertension   3. Mixed hyperlipidemia   4. S/P primary angioplasty with coronary stent   5. Cardiac risk counseling   6. Counseling on health promotion and disease prevention   7. Medication management       PLAN:   CAD s/p prior LAD PCI in 2016:  -no angina -Aspirin 81 mg -statin: atorvastatin 40 mg daily, tolerating.  -LDL goal <70, last LDL 78 per [facility], dated 11/19/2019 -antianginals: none required -counseled on diet recommendations and AHA exercise guidelines, below -risk factor modification, as below -counseled on red flag warning signs that need immediate medical attention  Hypertension: goal <130/80 -near goal in office on olmesartan. Has been well controlled to low in the past. No longer on HCTZ  Hypertriglyceridemia: he notes this for many years, with number well over 1000 in the past. No history of pancreatitis.  -attempted vascepa  with good drop in TG (though did not get below 150). However, the cost of this varied for him.  -changed to fibrate. No side effects noted (given that he is on high intensity statin) -last TG 235, not at goal -he notes intermittent exercise/less than ideal diet. He would like to work on this -recheck fasting lipids at next visit if not done prior  Secondary prevention -recommend heart healthy/Mediterranean diet, with whole grains, fruits, vegetable, fish, lean meats, nuts, and olive oil. Limit salt. -recommend moderate walking, 3-5 times/week for 30-50 minutes each session. Aim for at least 150 minutes.week. Goal should be pace of 3 miles/hours, or walking 1.5 miles in 30 minutes -recommend avoidance of tobacco products. Avoid excess alcohol.  Plan for follow up: 1 year or sooner as needed  Medication Adjustments/Labs and Tests Ordered: Current medicines are reviewed at length with the patient today.  Concerns regarding medicines are outlined above.   No orders of the defined types were placed in this encounter.  No orders of the defined types were placed in this encounter.  There are no Patient Instructions on file for this visit.    I,Jessica Ford,acting as a Neurosurgeon for [employer], MD.,have documented all relevant  documentation on the behalf of Jodelle Red, MD,as directed by  Jodelle Red, MD while in the presence of Jodelle Red, MD.   I, Myrtice Lauth, have reviewed all documentation for this visit. The documentation on 03/16/22 for the exam, diagnosis, procedures, and orders are all accurate and complete.   Signed, Jodelle Red, MD PhD 03/16/2022  [facility]

## 2022-12-17 ENCOUNTER — Emergency Department (HOSPITAL_COMMUNITY): Payer: Medicare Other

## 2022-12-17 ENCOUNTER — Inpatient Hospital Stay (HOSPITAL_COMMUNITY)
Admission: EM | Admit: 2022-12-17 | Discharge: 2022-12-21 | DRG: 982 | Disposition: A | Discharge status: Home / Self Care | Payer: Medicare Other | Attending: Internal Medicine | Admitting: Internal Medicine

## 2022-12-17 ENCOUNTER — Encounter (HOSPITAL_COMMUNITY): Payer: Self-pay

## 2022-12-17 ENCOUNTER — Other Ambulatory Visit: Payer: Self-pay

## 2022-12-17 DIAGNOSIS — Z6831 Body mass index (BMI) 31.0-31.9, adult: Secondary | ICD-10-CM

## 2022-12-17 DIAGNOSIS — I2510 Atherosclerotic heart disease of native coronary artery without angina pectoris: Secondary | ICD-10-CM | POA: Diagnosis present

## 2022-12-17 DIAGNOSIS — E785 Hyperlipidemia, unspecified: Secondary | ICD-10-CM | POA: Diagnosis present

## 2022-12-17 DIAGNOSIS — M7989 Other specified soft tissue disorders: Secondary | ICD-10-CM | POA: Diagnosis present

## 2022-12-17 DIAGNOSIS — Z955 Presence of coronary angioplasty implant and graft: Secondary | ICD-10-CM

## 2022-12-17 DIAGNOSIS — E669 Obesity, unspecified: Secondary | ICD-10-CM | POA: Diagnosis present

## 2022-12-17 DIAGNOSIS — L02512 Cutaneous abscess of left hand: Secondary | ICD-10-CM | POA: Diagnosis present

## 2022-12-17 DIAGNOSIS — W5501XA Bitten by cat, initial encounter: Secondary | ICD-10-CM

## 2022-12-17 DIAGNOSIS — R739 Hyperglycemia, unspecified: Secondary | ICD-10-CM | POA: Diagnosis present

## 2022-12-17 DIAGNOSIS — M65842 Other synovitis and tenosynovitis, left hand: Secondary | ICD-10-CM | POA: Diagnosis present

## 2022-12-17 DIAGNOSIS — M5136 Other intervertebral disc degeneration, lumbar region: Secondary | ICD-10-CM | POA: Diagnosis present

## 2022-12-17 DIAGNOSIS — D72829 Elevated white blood cell count, unspecified: Secondary | ICD-10-CM

## 2022-12-17 DIAGNOSIS — L03114 Cellulitis of left upper limb: Secondary | ICD-10-CM | POA: Diagnosis not present

## 2022-12-17 DIAGNOSIS — I10 Essential (primary) hypertension: Secondary | ICD-10-CM | POA: Diagnosis present

## 2022-12-17 DIAGNOSIS — S61452A Open bite of left hand, initial encounter: Secondary | ICD-10-CM | POA: Diagnosis present

## 2022-12-17 DIAGNOSIS — Z23 Encounter for immunization: Secondary | ICD-10-CM

## 2022-12-17 DIAGNOSIS — E871 Hypo-osmolality and hyponatremia: Secondary | ICD-10-CM | POA: Diagnosis present

## 2022-12-17 DIAGNOSIS — Z7982 Long term (current) use of aspirin: Secondary | ICD-10-CM

## 2022-12-17 DIAGNOSIS — G43909 Migraine, unspecified, not intractable, without status migrainosus: Secondary | ICD-10-CM | POA: Diagnosis present

## 2022-12-17 DIAGNOSIS — Z79899 Other long term (current) drug therapy: Secondary | ICD-10-CM

## 2022-12-17 DIAGNOSIS — Z8249 Family history of ischemic heart disease and other diseases of the circulatory system: Secondary | ICD-10-CM

## 2022-12-17 DIAGNOSIS — S61459A Open bite of unspecified hand, initial encounter: Secondary | ICD-10-CM | POA: Diagnosis present

## 2022-12-17 DIAGNOSIS — M009 Pyogenic arthritis, unspecified: Secondary | ICD-10-CM | POA: Diagnosis present

## 2022-12-17 DIAGNOSIS — E8809 Other disorders of plasma-protein metabolism, not elsewhere classified: Secondary | ICD-10-CM | POA: Diagnosis present

## 2022-12-17 DIAGNOSIS — D649 Anemia, unspecified: Secondary | ICD-10-CM | POA: Diagnosis present

## 2022-12-17 LAB — CBC WITH DIFFERENTIAL/PLATELET
Abs Immature Granulocytes: 0.04 10*3/uL (ref 0.00–0.07)
Basophils Absolute: 0.1 10*3/uL (ref 0.0–0.1)
Basophils Relative: 0 %
Eosinophils Absolute: 0.1 10*3/uL (ref 0.0–0.5)
Eosinophils Relative: 0 %
HCT: 38.8 % — ABNORMAL LOW (ref 39.0–52.0)
Hemoglobin: 13.3 g/dL (ref 13.0–17.0)
Immature Granulocytes: 0 %
Lymphocytes Relative: 11 %
Lymphs Abs: 1.3 10*3/uL (ref 0.7–4.0)
MCH: 33.4 pg (ref 26.0–34.0)
MCHC: 34.3 g/dL (ref 30.0–36.0)
MCV: 97.5 fL (ref 80.0–100.0)
Monocytes Absolute: 1.2 10*3/uL — ABNORMAL HIGH (ref 0.1–1.0)
Monocytes Relative: 10 %
Neutro Abs: 9.8 10*3/uL — ABNORMAL HIGH (ref 1.7–7.7)
Neutrophils Relative %: 79 %
Platelets: 214 10*3/uL (ref 150–400)
RBC: 3.98 MIL/uL — ABNORMAL LOW (ref 4.22–5.81)
RDW: 12.1 % (ref 11.5–15.5)
WBC: 12.5 10*3/uL — ABNORMAL HIGH (ref 4.0–10.5)
nRBC: 0 % (ref 0.0–0.2)

## 2022-12-17 LAB — GLUCOSE, CAPILLARY: Glucose-Capillary: 123 mg/dL — ABNORMAL HIGH (ref 70–99)

## 2022-12-17 LAB — BASIC METABOLIC PANEL
Anion gap: 7 (ref 5–15)
BUN: 22 mg/dL (ref 8–23)
CO2: 22 mmol/L (ref 22–32)
Calcium: 9.2 mg/dL (ref 8.9–10.3)
Chloride: 101 mmol/L (ref 98–111)
Creatinine, Ser: 1.11 mg/dL (ref 0.61–1.24)
GFR, Estimated: >60 mL/min (ref >60)
Glucose, Bld: 160 mg/dL — ABNORMAL HIGH (ref 70–99)
Potassium: 4 mmol/L (ref 3.5–5.1)
Sodium: 130 mmol/L — ABNORMAL LOW (ref 135–145)

## 2022-12-17 MED ORDER — SODIUM CHLORIDE 0.9 % IV SOLN: 3.0000 g | Freq: Once | INTRAVENOUS | Status: DC

## 2022-12-17 MED ORDER — ATORVASTATIN CALCIUM 40 MG PO TABS
40.0000 mg | ORAL_TABLET | Freq: Every day | ORAL | Status: DC
  Administered 2022-12-17 – 2022-12-20 (×4): 40 mg via ORAL
  Filled 2022-12-17 (×4): qty 1

## 2022-12-17 MED ORDER — ASPIRIN 81 MG PO TBEC
81.0000 mg | DELAYED_RELEASE_TABLET | Freq: Every day | ORAL | Status: DC
  Administered 2022-12-17 – 2022-12-20 (×4): 81 mg via ORAL
  Filled 2022-12-17 (×4): qty 1

## 2022-12-17 MED ORDER — ACETAMINOPHEN 325 MG PO TABS
650.0000 mg | ORAL_TABLET | Freq: Four times a day (QID) | ORAL | Status: DC | PRN
  Administered 2022-12-20 (×2): 650 mg via ORAL
  Filled 2022-12-17 (×2): qty 2

## 2022-12-17 MED ORDER — SODIUM CHLORIDE 0.9 % IV SOLN: INTRAVENOUS | Status: AC

## 2022-12-17 MED ORDER — SODIUM CHLORIDE 0.9 % IV SOLN: 3.0000 g | Freq: Four times a day (QID) | INTRAVENOUS | Status: DC

## 2022-12-17 MED ORDER — ACETAMINOPHEN 650 MG RE SUPP: 650.0000 mg | Freq: Four times a day (QID) | RECTAL | Status: DC | PRN

## 2022-12-17 MED ORDER — ENOXAPARIN SODIUM 40 MG/0.4ML IJ SOSY
40.0000 mg | PREFILLED_SYRINGE | INTRAMUSCULAR | Status: DC
  Administered 2022-12-17 – 2022-12-20 (×4): 40 mg via SUBCUTANEOUS
  Filled 2022-12-17 (×4): qty 0.4

## 2022-12-17 MED ORDER — OXYCODONE HCL 5 MG PO TABS: 5.0000 mg | ORAL_TABLET | ORAL | Status: DC | PRN

## 2022-12-17 MED ORDER — AMOXICILLIN-POT CLAVULANATE 875-125 MG PO TABS
1.0000 | ORAL_TABLET | Freq: Once | ORAL | Status: AC
  Administered 2022-12-17: 1 via ORAL
  Filled 2022-12-17: qty 1

## 2022-12-17 MED ORDER — ONDANSETRON 4 MG PO TBDP
4.0000 mg | ORAL_TABLET | Freq: Once | ORAL | Status: AC
  Administered 2022-12-17: 4 mg via ORAL
  Filled 2022-12-17: qty 1

## 2022-12-17 MED ORDER — TETANUS-DIPHTH-ACELL PERTUSSIS 5-2.5-18.5 LF-MCG/0.5 IM SUSY
0.5000 mL | PREFILLED_SYRINGE | Freq: Once | INTRAMUSCULAR | Status: AC
  Administered 2022-12-17: 0.5 mL via INTRAMUSCULAR
  Filled 2022-12-17: qty 0.5

## 2022-12-17 MED ORDER — OXYCODONE-ACETAMINOPHEN 5-325 MG PO TABS
2.0000 | ORAL_TABLET | Freq: Once | ORAL | Status: AC
  Administered 2022-12-17: 2 via ORAL
  Filled 2022-12-17: qty 2

## 2022-12-17 MED ORDER — ONDANSETRON HCL 4 MG PO TABS: 4.0000 mg | ORAL_TABLET | Freq: Four times a day (QID) | ORAL | Status: DC | PRN

## 2022-12-17 MED ORDER — FENOFIBRATE 160 MG PO TABS
160.0000 mg | ORAL_TABLET | Freq: Every day | ORAL | Status: DC
  Administered 2022-12-17 – 2022-12-20 (×4): 160 mg via ORAL
  Filled 2022-12-17 (×6): qty 1

## 2022-12-17 MED ORDER — ONDANSETRON HCL 4 MG/2ML IJ SOLN: 4.0000 mg | Freq: Four times a day (QID) | INTRAMUSCULAR | Status: DC | PRN

## 2022-12-17 MED ORDER — SODIUM CHLORIDE 0.9 % IV SOLN
3.0000 g | Freq: Four times a day (QID) | INTRAVENOUS | Status: DC
  Administered 2022-12-17 – 2022-12-21 (×16): 3 g via INTRAVENOUS
  Filled 2022-12-17 (×16): qty 8

## 2022-12-17 NOTE — ED Triage Notes (Addendum)
Pt came in via POV d/t a cat bite that happened yesterday & top of his Lt had is very swollen & painful. Also has some teeth marks in the palm of his Rt hand. He uses cold packs for pain & can move his fingers. He does reports that this cat has had its shots, A/Ox4.

## 2022-12-17 NOTE — Assessment & Plan Note (Signed)
Continue medical management ASA, statin

## 2022-12-17 NOTE — Assessment & Plan Note (Signed)
Continue lipitor and fenofibrate 

## 2022-12-17 NOTE — ED Notes (Signed)
Patient leaving the floor in stable condition, AOX4, with his belongings and staff.

## 2022-12-17 NOTE — Assessment & Plan Note (Signed)
[place] and states he has been outside sweating a lot Gentle, time limited IVF Trend If no improvement, check urine studies

## 2022-12-17 NOTE — H&P (Addendum)
History and Physical    Patient: Jesse Hodges JYN:[id number] DOB: 11-02-1950 DOA: 12/17/2022 DOS: the patient was seen and examined on 12/17/2022 PCP: Clayborn Heron, MD  Patient coming from: Home - lives with his wife.    Chief Complaint: cat bite.   HPI: Jesse Hodges is a 72 y.o. male with medical history significant of DDD, HLD, HTN, CAD s/p PCI who presented to ED for a cat bite to his left hand that has had progressive pain and swelling. He is right handed. Cat is vaccinated for rabies/routine shots. Got his rabies shot last week. Has been maintaining the cat x 2 years. Yesterday afternoon around 4pm he was helping his wife get a cat and he got bit on the top of his left hand. He cleaned it with water and H202 and then polysporin. Overnight it woke him up around 4Am due to pain and has progressively gotten worse. He states he has pain moving his fingers.    He has been feeling good. Denies any fever/chills, vision changes/headaches, chest pain or palpitations, shortness of breath or cough, abdominal pain, N/V/D, dysuria or leg swelling. He has been eating and drinking fine. [place]. He thinks he had his tdap at his PCP, but given today.    He does not smoke or drink alcohol.   ER Course:  vitals: afebrile, bp: 158/78, HR: 79, RR: 16, oxygen: 100%RA Pertinent labs: wbc: 12.5, sodium: 130,  Hand xray: Soft tissue swelling without radiopaque foreign body. Concerning for cellulitis. Wrist chondrocalcinosis.  Base of thumb CMC joint arthritis. In ED: hand surgery consulted Started on unasyn and given tdap. TR[facility] asked to admit.    Review of Systems: As mentioned in the history of present illness. All other systems reviewed and are negative. Past Medical History:  Diagnosis Date   Complication of anesthesia    wife states pt had a massive drop in temperature after one of his back surgeries   Coronary artery disease 2016    1 stent   DDD (degenerative disc disease), lumbar    Dyslipidemia 07/16/2014   Headache    migraines as a child   Hypertension    Low back pain    Past Surgical History:  Procedure Laterality Date   BACK SURGERY     x 4 lumbar region   CORONARY ANGIOPLASTY WITH STENT PLACEMENT  07/17/2014   "1"   IR EPIDUROGRAPHY  02/05/2018   LEFT HEART CATHETERIZATION WITH CORONARY ANGIOGRAM N/A 07/17/2014   Procedure: LEFT HEART CATHETERIZATION WITH CORONARY ANGIOGRAM;  Surgeon: Othella Boyer, MD;  Location: [facility] CATH LAB;  Service: Cardiovascular;  Laterality: N/A;   LUMBAR LAMINECTOMY  ~ 1995; ~ 2000; ~ 2002; ~ 2004   LUMBAR LAMINECTOMY/DECOMPRESSION MICRODISCECTOMY Left 02/14/2018   Procedure: Left Lumbar three-four Microdiscectomy;  Surgeon: Barnett Abu, MD;  Location: [facility] OR;  Service: Neurosurgery;  Laterality: Left;   SHOULDER ARTHROSCOPY Right 02/2007   adhesion release   Social History:  reports that he has never smoked. He has never used smokeless tobacco. He reports current alcohol use. He reports that he does not use drugs.  No Known Allergies  Family History  Problem Relation Age of Onset   Hypertension Father     Prior to Admission medications   Medication Sig Start Date End Date Taking? Authorizing Provider  aspirin EC 81 MG tablet Take 81 mg by mouth daily.    [provider]  atorvastatin (LIPITOR) 40 MG tablet Take 1 tablet (40 mg total) by mouth daily. 03/17/22   Jodelle Red, MD  fenofibrate 160 MG tablet TAKE 1 TABLET BY MOUTH EVERY DAY 03/17/22   Jodelle Red, MD  olmesartan (BENICAR) 5 MG tablet TAKE 1 TABLET BY MOUTH EVERY DAY 03/17/22   Jodelle Red, MD    Physical Exam: Vitals:   12/17/22 1223 12/17/22 1300 12/17/22 1311 12/17/22 1400  BP: 99/60 114/69  102/61  Pulse: 66 69  62  Resp: 16 16  18   Temp:   98.5 F (36.9 C)   TempSrc:   Oral   SpO2: 100% 95%  93%   General:  Appears calm and comfortable and is in NAD Eyes:   PERRL, EOMI, normal lids, iris ENT:  grossly normal hearing, lips & tongue, mmm; appropriate dentition Neck:  no LAD, masses or thyromegaly; no carotid bruits Cardiovascular:  RRR, no m/r/g. No LE edema.  Respiratory:   CTA bilaterally with no wheezes/rales/rhonchi.  Normal respiratory effort. Abdomen:  soft, NT, ND, NABS Back:   normal alignment, no CVAT Skin:  no rash or induration seen on limited exam. Left hand: 2 puncture marks on dorsal side with edema. TTP. Flexion and extension of all digits intact. Radial pulse intact. Minimal erythema.    Musculoskeletal:  grossly normal tone BUE/BLE, good ROM, no bony abnormality Lower extremity:  No LE edema.  Limited foot exam with no ulcerations.  2+ distal pulses. Psychiatric:  grossly normal mood and affect, speech fluent and appropriate, AOx3 Neurologic:  CN 2-12 grossly intact, moves all extremities in coordinated fashion, sensation intact   Radiological Exams on Admission: Independently reviewed - see discussion in A/P where applicable  DG Hand Complete Left  Result Date: 12/17/2022 CLINICAL DATA:  Bit by a cat EXAM: LEFT HAND - COMPLETE 3+ VIEW COMPARISON:  None Available. FINDINGS: Soft tissue swelling over the posterior aspect of the hand. No fracture, malalignment or radiopaque foreign body. 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 IMPRESSION: Soft tissue swelling without radiopaque foreign body. Concerning for cellulitis. 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 Electronically Signed   By: Malachy Moan M.D.   On: 12/17/2022 10:16     Labs on Admission: I have personally reviewed the available labs and imaging studies at the time of the admission.  Pertinent labs:    wbc: 12.5,  sodium: 130  Assessment and Plan: Principal Problem:   Cellulitis of hand, left due to cat bite Active Problems:   Hyponatremia   Leukocytosis   Essential hypertension   CAD (coronary artery  disease), native coronary artery   Dyslipidemia    Assessment and Plan: * Cellulitis of hand, left due to cat bite 72 year old right handed male presenting with one day history of cat bite to his left hand with worsening pain and swelling.  -obs to med-surg -tdap given in ED -cat received rabies vaccine last week, but family has known cat x 2 years with no erratic behavior -continue IV unasyn  -pulses, flexion and extension intact -hand surgery consulted by EDP  -ice prn -tylenol/pain medication prn   Hyponatremia [place] and states he has been outside sweating a lot Gentle, time limited IVF Trend If no improvement, check urine studies   Leukocytosis Cellulitis vs. Some dehydration  No other SIRS criteria Gentle, time limited IVF BC if fever Trend   Essential hypertension Hold benicar with soft bp   CAD (coronary  artery disease), native coronary artery Continue medical management ASA, statin  Dyslipidemia Continue lipitor and fenofibrate     Advance Care Planning:   Code Status: Full Code   Consults: ortho hand: Dr. Leanne Chang   DVT Prophylaxis: lovenox   Family Communication: wife at bedside   Severity of Illness: The appropriate patient status for this patient is OBSERVATION. Observation status is judged to be reasonable and necessary in order to provide the required intensity of service to ensure the patient's safety. The patient's presenting symptoms, physical exam findings, and initial radiographic and laboratory data in the context of their medical condition is felt to place them at decreased risk for further clinical deterioration. Furthermore, it is anticipated that the patient will be medically stable for discharge from the hospital within 2 midnights of admission.   Author: Orland Mustard, MD 12/17/2022 5:47 PM  For on call review [email or web address].

## 2022-12-17 NOTE — Assessment & Plan Note (Signed)
Cellulitis vs. Some dehydration  No other SIRS criteria Gentle, time limited IVF BC if fever Trend

## 2022-12-17 NOTE — Progress Notes (Signed)
Pharmacy Antibiotic Note  Jesse Hodges is a 72 y.o. male admitted on 12/17/2022 with  cellulitis secondary to cat bite .  Pharmacy has been consulted for Unasyn dosing.  Plan: Unasyn 3g Q6H.  Follow culture data for de-escalation.  Monitor renal function for dose adjustments as indicated.    Temp (24hrs), Avg:97.9 F (36.6 C), Min:97.9 F (36.6 C), Max:97.9 F (36.6 C)  Recent Labs  Lab 12/17/22 1044  WBC 12.5*  CREATININE 1.11    CrCl cannot be calculated (Unknown ideal weight.).    No Known Allergies  Thank you for allowing pharmacy to be a part of this patient's care.  Estill Batten, PharmD, BCCCP  12/17/2022 12:48 PM

## 2022-12-17 NOTE — ED Notes (Signed)
ED TO INPATIENT HANDOFF REPORT  ED Nurse Name and Phone #: Rulon Eisenmenger Name/Age/Gender Jesse Hodges 72 y.o. male Room/Bed: 040C/040C  Code Status   Code Status: Full Code  Home/SNF/Other Home Patient oriented to: self, place, time, and situation Is this baseline? Yes   Triage Complete: Triage complete  Chief Complaint Cellulitis of hand, left [L03.114]  Triage Note Pt came in via POV d/t a cat bite that happened yesterday & top of his Lt had is very swollen & painful. Also has some teeth marks in the palm of his Rt hand. He uses cold packs for pain & can move his fingers. He does reports that this cat has had its shots, A/Ox4.   Allergies No Known Allergies  Level of Care/Admitting Diagnosis ED Disposition     ED Disposition  Admit   Condition  --   Comment  Hospital Area: [facility] [[id number]]  Level of Care: Med-Surg [16]  May place patient in observation at [facility] or [facility] if equivalent level of care is available:: Yes  Covid Evaluation: Asymptomatic - no recent exposure (last 10 days) testing not required  Diagnosis: Cellulitis of hand, left [696295]  Admitting Physician: Orland Mustard [[id number]]  Attending Physician: Orland Mustard [[id number]]          B Medical/Surgery History Past Medical History:  Diagnosis Date   Complication of anesthesia    wife states pt had a massive drop in temperature after one of his back surgeries   Coronary artery disease 2016   1 stent   DDD (degenerative disc disease), lumbar    Dyslipidemia 07/16/2014   Headache    migraines as a child   Hypertension    Low back pain    Past Surgical History:  Procedure Laterality Date   BACK SURGERY     x 4 lumbar region   CORONARY ANGIOPLASTY WITH STENT PLACEMENT  07/17/2014   "1"   IR EPIDUROGRAPHY  02/05/2018   LEFT HEART CATHETERIZATION WITH CORONARY ANGIOGRAM N/A 07/17/2014   Procedure: LEFT HEART CATHETERIZATION WITH CORONARY ANGIOGRAM;   Surgeon: Othella Boyer, MD;  Location: [facility] CATH LAB;  Service: Cardiovascular;  Laterality: N/A;   LUMBAR LAMINECTOMY  ~ 1995; ~ 2000; ~ 2002; ~ 2004   LUMBAR LAMINECTOMY/DECOMPRESSION MICRODISCECTOMY Left 02/14/2018   Procedure: Left Lumbar three-four Microdiscectomy;  Surgeon: Barnett Abu, MD;  Location: [facility] OR;  Service: Neurosurgery;  Laterality: Left;   SHOULDER ARTHROSCOPY Right 02/2007   adhesion release     A IV Location/Drains/Wounds Patient Lines/Drains/Airways Status     Active Line/Drains/Airways     Name Placement date Placement time Site Days   Peripheral IV 12/17/22 20 G Right Antecubital 12/17/22  1500  Antecubital  less than 1   Incision (Closed) 02/14/18 Back 02/14/18  0936  -- 1767   Pressure Injury 02/03/18 Stage I -  Intact skin with non-blanchable redness of a localized area usually over a bony prominence. Non-blanchable erythema on bilateral buttocks 02/03/18  1100  -- 1778   Pressure Injury 02/03/18 Stage II -  Partial thickness loss of dermis presenting as a shallow open ulcer with a red, pink wound bed without slough. Dime size open skin pressure wound on lower buttocks 02/03/18  1100  -- 1778            Intake/Output Last 24 hours No intake or output data in the 24 hours ending 12/17/22 1741  Labs/Imaging Results for orders placed or performed during  the hospital encounter of 12/17/22 (from the past 48 hour(s))  CBC with Differential     Status: Abnormal   Collection Time: 12/17/22 10:44 AM  Result Value Ref Range   WBC 12.5 (H) 4.0 - 10.5 K/uL   RBC 3.98 (L) 4.22 - 5.81 MIL/uL   Hemoglobin 13.3 13.0 - 17.0 g/dL   HCT 16.1 (L) 09.6 - 04.5 %   MCV 97.5 80.0 - 100.0 fL   MCH 33.4 26.0 - 34.0 pg   MCHC 34.3 30.0 - 36.0 g/dL   RDW 40.9 81.1 - 91.4 %   Platelets 214 150 - 400 K/uL   nRBC 0.0 0.0 - 0.2 %   Neutrophils Relative % 79 %   Neutro Abs 9.8 (H) 1.7 - 7.7 K/uL   Lymphocytes Relative 11 %   Lymphs Abs 1.3 0.7 - 4.0 K/uL   Monocytes  Relative 10 %   Monocytes Absolute 1.2 (H) 0.1 - 1.0 K/uL   Eosinophils Relative 0 %   Eosinophils Absolute 0.1 0.0 - 0.5 K/uL   Basophils Relative 0 %   Basophils Absolute 0.1 0.0 - 0.1 K/uL   Immature Granulocytes 0 %   Abs Immature Granulocytes 0.04 0.00 - 0.07 K/uL    Comment: Performed at [facility], [address], [place], [place] [address]  Basic metabolic panel     Status: Abnormal   Collection Time: 12/17/22 10:44 AM  Result Value Ref Range   Sodium 130 (L) 135 - 145 mmol/L   Potassium 4.0 3.5 - 5.1 mmol/L   Chloride 101 98 - 111 mmol/L   CO2 22 22 - 32 mmol/L   Glucose, Bld 160 (H) 70 - 99 mg/dL    Comment: Glucose reference range applies only to samples taken after fasting for at least 8 hours.   BUN 22 8 - 23 mg/dL   Creatinine, Ser 6.21 0.61 - 1.24 mg/dL   Calcium 9.2 8.9 - 30.8 mg/dL   GFR, Estimated >65 >78 mL/min    Comment: (NOTE) Calculated using the CKD-EPI Creatinine Equation (2021)    Anion gap 7 5 - 15    Comment: Performed at [facility], [address], [place], [place] [address]   DG Hand Complete Left  Result Date: 12/17/2022 CLINICAL DATA:  Bit by a cat EXAM: LEFT HAND - COMPLETE 3+ VIEW COMPARISON:  None Available. FINDINGS: Soft tissue swelling over the posterior aspect of the hand. No fracture, malalignment or radiopaque foreign body. 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 IMPRESSION: Soft tissue swelling without radiopaque foreign body. Concerning for cellulitis. 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 Electronically Signed   By: Malachy Moan M.D.   On: 12/17/2022 10:16    Pending Labs Unresulted Labs (From admission, onward)     Start     Ordered   12/18/22 0500  Basic metabolic panel  Tomorrow morning,   R        12/17/22 1311   12/18/22 0500  CBC  Tomorrow morning,   R        12/17/22 1311            Vitals/Pain Today's Vitals   12/17/22 1223 12/17/22 1300  12/17/22 1311 12/17/22 1400  BP: 99/60 114/69  102/61  Pulse: 66 69  62  Resp: 16 16  18   Temp:   98.5 F (36.9 C)   TempSrc:   Oral   SpO2: 100% 95%  93%  Isolation Precautions No active isolations  Medications Medications  Ampicillin-Sulbactam (UNASYN) 3 g in sodium chloride 0.9 % 100 mL IVPB (3 g Intravenous New Bag/Given 12/17/22 1737)  enoxaparin (LOVENOX) injection 40 mg (has no administration in time range)  0.9 %  sodium chloride infusion ( Intravenous New Bag/Given 12/17/22 1506)  acetaminophen (TYLENOL) tablet 650 mg (has no administration in time range)    Or  acetaminophen (TYLENOL) suppository 650 mg (has no administration in time range)  oxyCODONE (Oxy IR/ROXICODONE) immediate release tablet 5 mg (has no administration in time range)  ondansetron (ZOFRAN) tablet 4 mg (has no administration in time range)    Or  ondansetron (ZOFRAN) injection 4 mg (has no administration in time range)  oxyCODONE-acetaminophen (PERCOCET/ROXICET) 5-325 MG per tablet 2 tablet (2 tablets Oral Given 12/17/22 1103)  Tdap (BOOSTRIX) injection 0.5 mL (0.5 mLs Intramuscular Given 12/17/22 1103)  amoxicillin-clavulanate (AUGMENTIN) 875-125 MG per tablet 1 tablet (1 tablet Oral Given 12/17/22 1103)  ondansetron (ZOFRAN-ODT) disintegrating tablet 4 mg (4 mg Oral Given 12/17/22 1103)    Mobility walks     Focused Assessments Cellulitis to left hand   R Recommendations: See Admitting Provider Note  Report given to:   Additional Notes: Ao, walky/talky/ left hand is swollen, ice pack applied

## 2022-12-17 NOTE — Assessment & Plan Note (Signed)
72 year old right handed male presenting with one day history of cat bite to his left hand with worsening pain and swelling.  -obs to med-surg -tdap given in ED -cat received rabies vaccine last week, but family has known cat x 2 years with no erratic behavior -continue IV unasyn  -pulses, flexion and extension intact -hand surgery consulted by EDP  -ice prn -tylenol/pain medication prn

## 2022-12-17 NOTE — ED Provider Notes (Signed)
[place] EMERGENCY DEPARTMENT AT [facility] Provider Note   CSN: [id number] Arrival date & time: 12/17/22  0901     History  Chief Complaint  Patient presents with   Animal Bite    Jesse Hodges is a 72 y.o. male with past medical history of CAD, hyperlipidemia, hypertension who presents to the ED complaining of a cat bite to his left hand yesterday.  States that this is a new family cat that became scared and bit him.  Also scratched his right hand.  Last tetanus was about 10 years ago.  Notes that left hand has become increasingly swollen and painful since the bite.  He did try to clean it at home.  Cat is up-to-date on its shots.  Denies other injury.  No fever, chest pain, shortness of breath, body aches, or other complaints.      Home Medications Prior to Admission medications   Medication Sig Start Date End Date Taking? Authorizing Provider  aspirin EC 81 MG tablet Take 81 mg by mouth daily.    [provider]  atorvastatin (LIPITOR) 40 MG tablet Take 1 tablet (40 mg total) by mouth daily. 03/17/22   Jodelle Red, MD  fenofibrate 160 MG tablet TAKE 1 TABLET BY MOUTH EVERY DAY 03/17/22   Jodelle Red, MD  olmesartan (BENICAR) 5 MG tablet TAKE 1 TABLET BY MOUTH EVERY DAY 03/17/22   Jodelle Red, MD      Allergies    Patient has no known allergies.    Review of Systems   Review of Systems  All other systems reviewed and are negative.   Physical Exam Updated Vital Signs BP (!) 158/78 (BP Location: Right Arm)   Pulse 79   Temp 97.9 F (36.6 C) (Oral)   Resp 16   SpO2 100%  Physical Exam Vitals and nursing note reviewed.  Constitutional:      General: He is not in acute distress.    Appearance: Normal appearance. He is not ill-appearing or toxic-appearing.  HENT:     Head: Normocephalic and atraumatic.     Mouth/Throat:     Mouth: Mucous membranes are moist.  Eyes:     Conjunctiva/sclera: Conjunctivae normal.   Cardiovascular:     Rate and Rhythm: Normal rate and regular rhythm.     Heart sounds: No murmur heard. Pulmonary:     Effort: Pulmonary effort is normal.     Breath sounds: Normal breath sounds.  Abdominal:     General: Abdomen is flat.     Palpations: Abdomen is soft.     Tenderness: There is no abdominal tenderness.  Musculoskeletal:     Cervical back: Normal range of motion and neck supple.     Comments: Superficial scratches to right palm, no active bleeding, no significant surrounding erythema or discharge; 2 puncture wounds to dorsum of left hand with minimal surrounding erythema, hand appears diffusely swollen, range of motion is intact, 2+ radial pulse, normal sensation, soft compartments of extremity  Skin:    General: Skin is warm and dry.     Capillary Refill: Capillary refill takes less than 2 seconds.  Neurological:     Mental Status: He is alert. Mental status is at baseline.  Psychiatric:        Behavior: Behavior normal.     ED Results / Procedures / Treatments   Labs (all labs ordered are listed, but only abnormal results are displayed) Labs Reviewed  CBC WITH DIFFERENTIAL/PLATELET - Abnormal; Notable for  the following components:      Result Value   WBC 12.5 (*)    RBC 3.98 (*)    HCT 38.8 (*)    Neutro Abs 9.8 (*)    Monocytes Absolute 1.2 (*)    All other components within normal limits  BASIC METABOLIC PANEL - Abnormal; Notable for the following components:   Sodium 130 (*)    Glucose, Bld 160 (*)    All other components within normal limits    EKG None  Radiology DG Hand Complete Left  Result Date: 12/17/2022 CLINICAL DATA:  Bit by a cat EXAM: LEFT HAND - COMPLETE 3+ VIEW COMPARISON:  None Available. FINDINGS: Soft tissue swelling over the posterior aspect of the hand. No fracture, malalignment or radiopaque foreign body. 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 IMPRESSION: Soft tissue swelling without radiopaque  foreign body. Concerning for cellulitis. 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 Electronically Signed   By: Malachy Moan M.D.   On: 12/17/2022 10:16    Procedures Procedures    Medications Ordered in ED Medications  Ampicillin-Sulbactam (UNASYN) 3 g in sodium chloride 0.9 % 100 mL IVPB (has no administration in time range)  oxyCODONE-acetaminophen (PERCOCET/ROXICET) 5-325 MG per tablet 2 tablet (2 tablets Oral Given 12/17/22 1103)  Tdap (BOOSTRIX) injection 0.5 mL (0.5 mLs Intramuscular Given 12/17/22 1103)  amoxicillin-clavulanate (AUGMENTIN) 875-125 MG per tablet 1 tablet (1 tablet Oral Given 12/17/22 1103)  ondansetron (ZOFRAN-ODT) disintegrating tablet 4 mg (4 mg Oral Given 12/17/22 1103)    ED Course/ Medical Decision Making/ A&P                             Medical Decision Making Amount and/or Complexity of Data Reviewed Labs: ordered. Decision-making details documented in ED Course. Radiology: ordered. Decision-making details documented in ED Course.  Risk Prescription drug management. Decision regarding hospitalization.   Medical Decision Making:   Jesse Hodges is a 72 y.o. male who presented to the ED today with cat bite detailed above.    Additional history discussed with patient's family/caregivers.  Patient's presentation is complicated by their history of advanced age, HTN.  Complete initial physical exam performed, notably the patient was in NAD. Superficial scratches to right palm, no active bleeding, no significant surrounding erythema or discharge; 2 puncture wounds to dorsum of left hand with minimal surrounding erythema, hand appears diffusely swollen, range of motion is intact, 2+ radial pulse, normal sensation, soft compartments of extremity.    Reviewed and confirmed nursing documentation for past medical history, family history, social history.    Initial Assessment:   With the patient's presentation, differential diagnosis includes  but is not limited to cellulitis, closed fist infection, flexor tenosynovitis, sepsis, retained foreign body.  This is most consistent with an acute complicated illness  Initial Plan:  Screening labs including CBC and Metabolic panel to evaluate for infectious or metabolic etiology of disease.  L hand XR to evaluate for infection/bony pathology Symptomatic management Tdap updated Objective evaluation as below reviewed   Initial Study Results:   Laboratory  All laboratory results reviewed without evidence of clinically relevant pathology.   Exceptions include: Na 130, WBC 12.5  Radiology:  All images reviewed independently. Agree with radiology report at this time.   DG Hand Complete Left  Result Date: 12/17/2022 CLINICAL DATA:  Bit by a cat EXAM: LEFT HAND - COMPLETE 3+ VIEW COMPARISON:  None Available. FINDINGS:  Soft tissue swelling over the posterior aspect of the hand. No fracture, malalignment or radiopaque foreign body. 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 IMPRESSION: Soft tissue swelling without radiopaque foreign body. Concerning for cellulitis. 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 Electronically Signed   By: Malachy Moan M.D.   On: 12/17/2022 10:16      Consults: Case discussed with Dr. Frazier Butt with hand who recommended admission for IV antibiotics to medicine team, will follow. Started on Unasyn.   Case discussed with HPS, Dr. Artis Flock, who will admit.   Final Assessment and Plan:   72 year old male presents to the ED with cat bite to left hand.  This occurred yesterday.  Cat is fully vaccinated.  He has 2 puncture wounds to the dorsum of the left hand.  See below above.  No significant erythema.  Hand is diffusely swollen with exquisite pain with flexion and extension of the hand and wrist.  Neurovascularly intact.  X-ray shows cellulitic changes.  Minimal leukocytosis.  Mild hyponatremia.  No recent volume losses.  No  recent change in p.o. intake.  He did have an episode of vomiting in the ED today but this was following dose of pain medication.  Discussed with hand surgery who recommended admission for IV Unasyn.  Patient agreeable with this plan.  Patient stable at time of admission.     Clinical Impression:  1. Cellulitis of left hand   2. Cat bite, initial encounter      Admit           Final Clinical Impression(s) / ED Diagnoses Final diagnoses:  Cellulitis of left hand  Cat bite, initial encounter    Rx / DC Orders ED Discharge Orders     None         Tonette Lederer, PA-C 12/17/22 1508    Cathren Laine, MD 12/17/22 1552

## 2022-12-17 NOTE — Assessment & Plan Note (Addendum)
Hold benicar with soft bp

## 2022-12-18 ENCOUNTER — Inpatient Hospital Stay (HOSPITAL_COMMUNITY): Payer: Medicare Other | Admitting: Anesthesiology

## 2022-12-18 ENCOUNTER — Encounter (HOSPITAL_COMMUNITY): Payer: Self-pay | Admitting: Internal Medicine

## 2022-12-18 ENCOUNTER — Other Ambulatory Visit: Payer: Self-pay

## 2022-12-18 ENCOUNTER — Encounter (HOSPITAL_COMMUNITY)
Admission: EM | Disposition: A | Discharge status: Home / Self Care | Payer: Self-pay | Source: Home / Self Care | Attending: Internal Medicine

## 2022-12-18 DIAGNOSIS — I10 Essential (primary) hypertension: Secondary | ICD-10-CM | POA: Diagnosis present

## 2022-12-18 DIAGNOSIS — S61459A Open bite of unspecified hand, initial encounter: Secondary | ICD-10-CM | POA: Diagnosis not present

## 2022-12-18 DIAGNOSIS — Z23 Encounter for immunization: Secondary | ICD-10-CM | POA: Diagnosis present

## 2022-12-18 DIAGNOSIS — D72829 Elevated white blood cell count, unspecified: Secondary | ICD-10-CM | POA: Diagnosis not present

## 2022-12-18 DIAGNOSIS — L03114 Cellulitis of left upper limb: Secondary | ICD-10-CM | POA: Diagnosis present

## 2022-12-18 DIAGNOSIS — M65842 Other synovitis and tenosynovitis, left hand: Secondary | ICD-10-CM | POA: Diagnosis present

## 2022-12-18 DIAGNOSIS — E871 Hypo-osmolality and hyponatremia: Secondary | ICD-10-CM

## 2022-12-18 DIAGNOSIS — R739 Hyperglycemia, unspecified: Secondary | ICD-10-CM | POA: Diagnosis present

## 2022-12-18 DIAGNOSIS — M7989 Other specified soft tissue disorders: Secondary | ICD-10-CM | POA: Diagnosis present

## 2022-12-18 DIAGNOSIS — D649 Anemia, unspecified: Secondary | ICD-10-CM | POA: Diagnosis present

## 2022-12-18 DIAGNOSIS — Z79899 Other long term (current) drug therapy: Secondary | ICD-10-CM | POA: Diagnosis not present

## 2022-12-18 DIAGNOSIS — W5501XA Bitten by cat, initial encounter: Secondary | ICD-10-CM | POA: Diagnosis not present

## 2022-12-18 DIAGNOSIS — Z955 Presence of coronary angioplasty implant and graft: Secondary | ICD-10-CM | POA: Diagnosis not present

## 2022-12-18 DIAGNOSIS — L02512 Cutaneous abscess of left hand: Secondary | ICD-10-CM | POA: Diagnosis present

## 2022-12-18 DIAGNOSIS — I2510 Atherosclerotic heart disease of native coronary artery without angina pectoris: Secondary | ICD-10-CM | POA: Diagnosis present

## 2022-12-18 DIAGNOSIS — S61452A Open bite of left hand, initial encounter: Secondary | ICD-10-CM | POA: Diagnosis present

## 2022-12-18 DIAGNOSIS — E8809 Other disorders of plasma-protein metabolism, not elsewhere classified: Secondary | ICD-10-CM | POA: Diagnosis present

## 2022-12-18 DIAGNOSIS — E785 Hyperlipidemia, unspecified: Secondary | ICD-10-CM

## 2022-12-18 DIAGNOSIS — Z8249 Family history of ischemic heart disease and other diseases of the circulatory system: Secondary | ICD-10-CM | POA: Diagnosis not present

## 2022-12-18 DIAGNOSIS — M5136 Other intervertebral disc degeneration, lumbar region: Secondary | ICD-10-CM

## 2022-12-18 DIAGNOSIS — M009 Pyogenic arthritis, unspecified: Secondary | ICD-10-CM | POA: Diagnosis present

## 2022-12-18 DIAGNOSIS — Z7982 Long term (current) use of aspirin: Secondary | ICD-10-CM | POA: Diagnosis not present

## 2022-12-18 DIAGNOSIS — Z6831 Body mass index (BMI) 31.0-31.9, adult: Secondary | ICD-10-CM | POA: Diagnosis not present

## 2022-12-18 DIAGNOSIS — G43909 Migraine, unspecified, not intractable, without status migrainosus: Secondary | ICD-10-CM | POA: Diagnosis present

## 2022-12-18 DIAGNOSIS — E669 Obesity, unspecified: Secondary | ICD-10-CM | POA: Diagnosis present

## 2022-12-18 HISTORY — PX: I & D EXTREMITY: SHX5045

## 2022-12-18 LAB — BASIC METABOLIC PANEL
Anion gap: 10 (ref 5–15)
BUN: 18 mg/dL (ref 8–23)
CO2: 22 mmol/L (ref 22–32)
Calcium: 8.7 mg/dL — ABNORMAL LOW (ref 8.9–10.3)
Chloride: 100 mmol/L (ref 98–111)
Creatinine, Ser: 1.14 mg/dL (ref 0.61–1.24)
GFR, Estimated: >60 mL/min (ref >60)
Glucose, Bld: 137 mg/dL — ABNORMAL HIGH (ref 70–99)
Potassium: 3.8 mmol/L (ref 3.5–5.1)
Sodium: 132 mmol/L — ABNORMAL LOW (ref 135–145)

## 2022-12-18 LAB — CBC
HCT: 36.5 % — ABNORMAL LOW (ref 39.0–52.0)
Hemoglobin: 12.7 g/dL — ABNORMAL LOW (ref 13.0–17.0)
MCH: 33.6 pg (ref 26.0–34.0)
MCHC: 34.8 g/dL (ref 30.0–36.0)
MCV: 96.6 fL (ref 80.0–100.0)
Platelets: 196 10*3/uL (ref 150–400)
RBC: 3.78 MIL/uL — ABNORMAL LOW (ref 4.22–5.81)
RDW: 12.3 % (ref 11.5–15.5)
WBC: 13 10*3/uL — ABNORMAL HIGH (ref 4.0–10.5)
nRBC: 0 % (ref 0.0–0.2)

## 2022-12-18 LAB — NO BLOOD PRODUCTS

## 2022-12-18 SURGERY — IRRIGATION AND DEBRIDEMENT EXTREMITY
Anesthesia: General | Site: Hand | Laterality: Left

## 2022-12-18 MED ORDER — STERILE WATER FOR IRRIGATION IR SOLN
Status: DC | PRN
  Administered 2022-12-18: 1000 mL

## 2022-12-18 MED ORDER — FENTANYL CITRATE (PF) 250 MCG/5ML IJ SOLN
INTRAMUSCULAR | Status: DC | PRN
  Administered 2022-12-18: 25 ug via INTRAVENOUS
  Administered 2022-12-18: 50 ug via INTRAVENOUS

## 2022-12-18 MED ORDER — PROPOFOL 10 MG/ML IV BOLUS
INTRAVENOUS | Status: AC
  Filled 2022-12-18: qty 20

## 2022-12-18 MED ORDER — ACETAMINOPHEN 500 MG PO TABS: 1000.0000 mg | ORAL_TABLET | Freq: Once | ORAL | Status: AC

## 2022-12-18 MED ORDER — ONDANSETRON HCL 4 MG/2ML IJ SOLN
INTRAMUSCULAR | Status: DC | PRN
  Administered 2022-12-18: 4 mg via INTRAVENOUS

## 2022-12-18 MED ORDER — ACETAMINOPHEN 500 MG PO TABS
ORAL_TABLET | ORAL | Status: AC
  Administered 2022-12-18: 1000 mg via ORAL
  Filled 2022-12-18: qty 2

## 2022-12-18 MED ORDER — BUPIVACAINE HCL (PF) 0.25 % IJ SOLN
INTRAMUSCULAR | Status: AC
  Filled 2022-12-18: qty 30

## 2022-12-18 MED ORDER — ORAL CARE MOUTH RINSE: 15.0000 mL | Freq: Once | OROMUCOSAL | Status: AC

## 2022-12-18 MED ORDER — LACTATED RINGERS IV SOLN: INTRAVENOUS | Status: DC

## 2022-12-18 MED ORDER — 0.9 % SODIUM CHLORIDE (POUR BTL) OPTIME
TOPICAL | Status: DC | PRN
  Administered 2022-12-18: 1000 mL

## 2022-12-18 MED ORDER — PROPOFOL 10 MG/ML IV BOLUS
INTRAVENOUS | Status: DC | PRN
  Administered 2022-12-18: 160 mg via INTRAVENOUS

## 2022-12-18 MED ORDER — DEXAMETHASONE SODIUM PHOSPHATE 10 MG/ML IJ SOLN
INTRAMUSCULAR | Status: DC | PRN
  Administered 2022-12-18: 5 mg via INTRAVENOUS

## 2022-12-18 MED ORDER — LIDOCAINE 2% (20 MG/ML) 5 ML SYRINGE
INTRAMUSCULAR | Status: DC | PRN
  Administered 2022-12-18: 100 mg via INTRAVENOUS

## 2022-12-18 MED ORDER — CHLORHEXIDINE GLUCONATE 0.12 % MT SOLN: 15.0000 mL | Freq: Once | OROMUCOSAL | Status: AC

## 2022-12-18 MED ORDER — FENTANYL CITRATE (PF) 250 MCG/5ML IJ SOLN
INTRAMUSCULAR | Status: AC
  Filled 2022-12-18: qty 5

## 2022-12-18 MED ORDER — CHLORHEXIDINE GLUCONATE 0.12 % MT SOLN
OROMUCOSAL | Status: AC
  Administered 2022-12-18: 15 mL via OROMUCOSAL
  Filled 2022-12-18: qty 15

## 2022-12-18 SURGICAL SUPPLY — 65 items
APL PRP STRL LF DISP 70% ISPRP (MISCELLANEOUS) ×1
BNDG CMPR 5X3 KNIT ELC UNQ LF (GAUZE/BANDAGES/DRESSINGS) ×1
BNDG CMPR 75X21 PLY HI ABS (MISCELLANEOUS)
BNDG ELASTIC 3INX 5YD STR LF (GAUZE/BANDAGES/DRESSINGS) ×1 IMPLANT
BNDG GAUZE DERMACEA FLUFF 4 (GAUZE/BANDAGES/DRESSINGS) ×2 IMPLANT
BNDG GZE DERMACEA 4 6PLY (GAUZE/BANDAGES/DRESSINGS) ×1
CHLORAPREP W/TINT 26 (MISCELLANEOUS) ×1 IMPLANT
CORD BIPOLAR FORCEPS 12FT (ELECTRODE) ×1 IMPLANT
COVER MAYO STAND STRL (DRAPES) ×1 IMPLANT
COVER SURGICAL LIGHT HANDLE (MISCELLANEOUS) ×1 IMPLANT
CUFF TOURN SGL QUICK 18X4 (TOURNIQUET CUFF) ×1 IMPLANT
CUFF TOURN SGL QUICK 24 (TOURNIQUET CUFF)
CUFF TRNQT CYL 24X4X16.5-23 (TOURNIQUET CUFF) IMPLANT
DRAIN PENROSE 18X1/4 LTX STRL (DRAIN) IMPLANT
DRAPE HALF SHEET 40X57 (DRAPES) ×1 IMPLANT
DRAPE OEC MINIVIEW 54X84 (DRAPES) IMPLANT
DRAPE SURG 17X23 STRL (DRAPES) ×1 IMPLANT
DRSG ADAPTIC 3X8 NADH LF (GAUZE/BANDAGES/DRESSINGS) ×1 IMPLANT
DRSG XEROFORM 1X8 (GAUZE/BANDAGES/DRESSINGS) IMPLANT
GAUZE SPONGE 4X4 12PLY STRL (GAUZE/BANDAGES/DRESSINGS) ×1 IMPLANT
GAUZE STRETCH 2X75IN STRL (MISCELLANEOUS) IMPLANT
GAUZE XEROFORM 1X8 LF (GAUZE/BANDAGES/DRESSINGS) ×1 IMPLANT
GLOVE BIO SURGEON STRL SZ7 (GLOVE) ×1 IMPLANT
GLOVE BIOGEL PI IND STRL 7.0 (GLOVE) IMPLANT
GLOVE BIOGEL PI IND STRL 8 (GLOVE) IMPLANT
GLOVE SURG UNDER POLY LF SZ7 (GLOVE) ×1 IMPLANT
GOWN STRL REUS W/ TWL XL LVL3 (GOWN DISPOSABLE) ×2 IMPLANT
GOWN STRL REUS W/TWL XL LVL3 (GOWN DISPOSABLE) ×1
IV CATH 18G X1.75 CATHLON (IV SOLUTION) IMPLANT
KIT BASIN OR (CUSTOM PROCEDURE TRAY) ×1 IMPLANT
KIT TURNOVER KIT B (KITS) ×1 IMPLANT
LOOP VASCLR MAXI BLUE 18IN ST (MISCELLANEOUS) IMPLANT
LOOP VASCULAR MAXI 18 BLUE (MISCELLANEOUS)
LOOPS VASCLR MAXI BLUE 18IN ST (MISCELLANEOUS) IMPLANT
MANIFOLD NEPTUNE II (INSTRUMENTS) IMPLANT
NDL HYPO 25GX1X1/2 BEV (NEEDLE) IMPLANT
NDL HYPO 25X1 1.5 SAFETY (NEEDLE) ×1 IMPLANT
NDL KEITH (NEEDLE) IMPLANT
NEEDLE HYPO 25GX1X1/2 BEV (NEEDLE) IMPLANT
NEEDLE HYPO 25X1 1.5 SAFETY (NEEDLE) IMPLANT
NEEDLE KEITH (NEEDLE) IMPLANT
NS IRRIG 1000ML POUR BTL (IV SOLUTION) ×1 IMPLANT
PACK ORTHO EXTREMITY (CUSTOM PROCEDURE TRAY) ×1 IMPLANT
PAD ABD 8X10 STRL (GAUZE/BANDAGES/DRESSINGS) ×1 IMPLANT
PAD ARMBOARD 7.5X6 YLW CONV (MISCELLANEOUS) ×1 IMPLANT
PAD CAST 4YDX4 CTTN HI CHSV (CAST SUPPLIES) ×2 IMPLANT
PADDING CAST ABS COTTON 3X4 (CAST SUPPLIES) ×1 IMPLANT
PADDING CAST COTTON 4X4 STRL (CAST SUPPLIES)
SPLINT PLASTER CAST XFAST 3X15 (CAST SUPPLIES) ×1 IMPLANT
SPONGE T-LAP 4X18 ~~LOC~~+RFID (SPONGE) ×1 IMPLANT
SUT FIBERWIRE 3-0 18 TAPR NDL (SUTURE)
SUT NYLON ETHILON 4-0 PS-2 18 (SUTURE) IMPLANT
SUTURE FIBERWR 3-0 18 TAPR NDL (SUTURE) IMPLANT
SWAB COLLECTION DEVICE MRSA (MISCELLANEOUS) IMPLANT
SWAB CULTURE ESWAB REG 1ML (MISCELLANEOUS) IMPLANT
SYR 50ML SLIP (SYRINGE) IMPLANT
SYR BULB EAR ULCER 3OZ GRN STR (SYRINGE) ×1 IMPLANT
SYR CONTROL 10ML LL (SYRINGE) IMPLANT
TOWEL GREEN STERILE (TOWEL DISPOSABLE) ×1 IMPLANT
TOWEL GREEN STERILE FF (TOWEL DISPOSABLE) ×2 IMPLANT
TUBE CONNECTING 12X1/4 (SUCTIONS) IMPLANT
UNDERPAD 30X36 HEAVY ABSORB (UNDERPADS AND DIAPERS) ×1 IMPLANT
VASCULAR TIE MAXI BLUE 18IN ST (MISCELLANEOUS)
WATER STERILE IRR 1000ML POUR (IV SOLUTION) ×1 IMPLANT
YANKAUER SUCT BULB TIP NO VENT (SUCTIONS) IMPLANT

## 2022-12-18 NOTE — Anesthesia Postprocedure Evaluation (Signed)
Anesthesia Post Note  Patient: SKYLON MEANS  Procedure(s) Performed: 1. Irrigation and debridement of cat bites on dorsum of LEFT hand 2. Irrigation and excisional debridement of LEFTdorsal hand abscess (10061) 3. Left radiocarpal and midcarpal arthrotomy for irrigation of infection (16109) (Left: Hand)     Patient location during evaluation: PACU Anesthesia Type: General Level of consciousness: sedated and patient cooperative Pain management: pain level controlled Vital Signs Assessment: post-procedure vital signs reviewed and stable Respiratory status: spontaneous breathing Cardiovascular status: stable Anesthetic complications: no   No notable events documented.  Last Vitals:  Vitals:   12/18/22 2045 12/18/22 2100  BP: 134/84 131/81  Pulse: 82 81  Resp: 13 15  Temp:  37.1 C  SpO2: 96% 95%    Last Pain:  Vitals:   12/18/22 2038  TempSrc:   PainSc: 0-No pain                 Lewie Loron

## 2022-12-18 NOTE — Hospital Course (Signed)
The patient is a 72 year old Caucasian male with a past medical history significant for but limited to urinary disc disease, hypertension, hyperlipidemia, history of CAD status post PCI as well as other comorbidities presented to ED secondary to cat bite to his left hand with progressively worsening pain and swelling.  Patient is right-handed and the cat was vaccinated apparently for rabies and had routine shots.  The cat gotten rabies shot last week and the patient has been maintaining the feral cat for almost 2 years.  Yesterday afternoon around 4 PM he was helping his wife and got a cat but the cat bit him on the top of the left hand.  He cleaned it with water and hydroperoxide and then Polysporin.  Overnight and woke him up around 4 AM due to the pain progressively got worse.  Patient states that he is able to move his fingers and has been feeling good prior to this.  He denied nausea, chills, vomiting, diarrhea.  [place] and had his Tdap given.  He does not smoke or drink any alcohol and presented with the above complaint and hand surgery was consulted and they are taking him to the OR given that he is placed on IV antibiotics and the hand has not improved.  **He is POD1 and Improving and Hand Surgery recommending continuing IV Abx today.   Assessment and Plan:  Left hand and wrist pain and swelling and cellulitis in the setting of a cat bite s/p I and D, Excisional Debridement of the Dorsal hand Abscess, and Left Radiocarpal and Midcarpal Arthrotomy for Irrigation of Infection POD1 -The Patient is a 72 year old Caucasian male presenting with 1 day history of a cat bite with his left hand.  Secondary worsening pain -Given Tdap in the ED -The cat was a feral cat but apparently he received rabies vaccine last week and family has no known cat with no history of erratic behavior -Placed on IV antibiotics with IV Unasyn and will continue today and  his dorsal hand is not as  swollen after surgery  -Continue with elevation in his pulses flexion and extension seem to be intact -WBC Trend: Recent Labs  Lab 12/17/22 1044 12/18/22 0130 12/19/22 0921  WBC 12.5* 13.0* 12.5*  -Current Aerobic/Anaerobic Cx pending   -Continue icing and elevation and acetaminophen and narcotic medication if necessary -Hand surgery was consulted and took the patient irrigation debridement given his worsening pain and swelling despite the antibiotics -Continue elevation to help with pain and swelling and further care and direction per Hand Surgery -Hand Surgery recommending continuing IV Abx today, following Cx, starting BID wound Care with Warm, diluted Hibiclens solution and likely D/C tomorrow (6/19) pending further improvement   Hyponatremia, improved -[place] and states he has been outside sweating a lot -Na+ Trend: Recent Labs  Lab 12/17/22 1044 12/18/22 0130 12/19/22 0921  NA 130* 132* 136  -Was given gentle time-limited fluids and this has now stopped but may consider resuming -Continue To monitor and trend and repeat CMP in the a.m. if not improving will consider further workup with urine studies   Leukocytosis -Cellulitis vs. Some dehydration  -No other SIRS criteria -WBC Trend: Recent Labs  Lab 12/17/22 1044 12/18/22 0130 12/19/22 0921  WBC 12.5* 13.0* 12.5*  -IV Unasyn and unfortunately no cultures were obtained on admission -To monitor for signs or symptoms infection and patient is going to the OR and  will need further cultures to be sent  Hypophosphatemia -Phos Level Trend: Recent Labs  Lab 12/19/22 0921  PHOS 2.3*  -Replete with po K Phos Neutral 500 mg po x1 -Continue to Monitor and Trend and Replete as Necessary -Repeat Phos Level in the AM   Essential Hypertension -Held Benicar with Soft BP initially but will resume today with Irebesartan 37.5 mg po Daily Substitution  -Continue monitor blood pressures per  protocol -Last blood pressure reading was 140/83   CAD (coronary artery disease), native coronary artery -Continue medical management -ASA, Statin -Monitor for CP   Dyslipidemia  -C/w Atorvastatin 40 mg po at bedtime and Fenofibrate 160 mg po qHS  Hyperglycemia -Check HbA1c -Continue to Monitor CBGs per Protocol and if Necessary will place on Sensitive Novolog SSI AC -CBG/Glucose Trend: Recent Labs  Lab 12/17/22 1044 12/17/22 2015 12/18/22 0130 12/19/22 0921  GLUCOSE 160*  --  137* 159*  GLUCAP  --  123*  --   --    Normocytic Anemia -Hgb/Hct Trend: Recent Labs  Lab 12/17/22 1044 12/18/22 0130 12/19/22 0921  HGB 13.3 12.7* 12.7*  HCT 38.8* 36.5* 37.0*  MCV 97.5 96.6 98.1  -Check Anemia Panel in the AM -Continue to Monitor for S/Sx of Bleeding; No overt bleeding noted -Repeat CBC in the AM  Hypoalbuminemia -Patient's Albumin Trend: Recent Labs  Lab 12/19/22 0921  ALBUMIN 3.3*  -Continue to Monitor and Trend and repeat CMP in the AM   Obesity -Complicates overall prognosis and care -Estimated body mass index is 30.13 kg/m as calculated from the following:   Height as of this encounter: 5\' 10"  (1.778 m).   Weight as of this encounter: 95.3 kg.  -Weight Loss and Dietary Counseling given

## 2022-12-18 NOTE — Progress Notes (Signed)
PROGRESS NOTE    Jesse Hodges  ZOX:[id number] DOB: 29-Nov-1950 DOA: 12/17/2022 PCP: Clayborn Heron, MD   Brief Narrative:  The patient is a 72 year old Caucasian male with a past medical history significant for but limited to urinary disc disease, hypertension, hyperlipidemia, history of CAD status post PCI as well as other comorbidities presented to ED secondary to cat bite to his left hand with progressively worsening pain and swelling.  Patient is right-handed and the cat was vaccinated apparently for rabies and had routine shots.  The cat gotten rabies shot last week and the patient has been maintaining the feral cat for almost 2 years.  Yesterday afternoon around 4 PM he was helping his wife and got a cat but the cat bit him on the top of the left hand.  He cleaned it with water and hydroperoxide and then Polysporin.  Overnight and woke him up around 4 AM due to the pain progressively got worse.  Patient states that he is able to move his fingers and has been feeling good prior to this.  He denied nausea, chills, vomiting, diarrhea.  [place] been [place] lot and had his Tdap given.  He does not smoke or drink any alcohol and presented to the complaint and hand surgery was consulted and they are taking him to the OR given that he is placed on IV antibiotics and the hand has not improved.  Assessment and Plan:  Left hand and wrist pain and swelling and cellulitis in the setting of a cat bite -Is a 80 year old Caucasian male presenting with 1 day history of a cat bite with his left hand.  Secondary worsening pain -Given Tdap in the ED -The cat was a feral cat but apparently he received rabies vaccine last week and family has no known cat with no history of erratic behavior -Placed on IV antibiotics with IV Unasyn and will continue and his dorsal hand is extremely swollen -Continue with elevation in his pulses flexion and extension seem to be  intact -Continue icing and elevation and acetaminophen and narcotic medication if necessary -The hand surgery was consulted and planning for going to the OR this evening for irrigation debridement given his worsening pain and swelling despite the antibiotics -Continue elevation to help with pain and swelling  Hyponatremia -[place] and states he has been outside sweating a lot -Na+ Trend: Recent Labs  Lab 12/17/22 1044 12/18/22 0130  NA 130* 132*  -Was given gentle time-limited fluids and this has now stopped but may consider resuming -To monitor and trend and repeat CMP in the a.m. if not improving will consider further workup with urine studies   Leukocytosis -Cellulitis vs. Some dehydration  -No other SIRS criteria -WBC Trend: Recent Labs  Lab 12/17/22 1044 12/18/22 0130  WBC 12.5* 13.0*  -IV Unasyn and unfortunately no cultures were obtained on admission -To monitor for signs or symptoms infection and patient is going to the OR and will need further cultures to be sent   Essential Hypertension -Held Benicar with Soft BP -Continue monitor blood pressures per protocol -Last blood pressure reading was 137/75   CAD (coronary artery disease), native coronary artery -Continue medical management -ASA, Statin -Monitor for CP   Dyslipidemia  -C/w Atorvastatin 40 mg po at bedtime and Fenofibrate 160 mg po qHS  Hyperglycemia -Check HbA1c -Continue to Monitor CBGs per Protocol and if Necessary will place on Sensitive Novolog SSI AC -  CBG/Glucose Trend: Recent Labs  Lab 12/17/22 1044 12/17/22 2015 12/18/22 0130  GLUCOSE 160*  --  137*  GLUCAP  --  123*  --    Normocytic Anemia -Hgb/Hct Trend: Recent Labs  Lab 12/17/22 1044 12/18/22 0130  HGB 13.3 12.7*  HCT 38.8* 36.5*  MCV 97.5 96.6  -Check Anemia Panel in the AM -Continue to Monitor for S/Sx of Bleeding; No overt bleeding noted -Repeat CBC in the AM  Obesity -Complicates overall prognosis and  care -Estimated body mass index is 31.58 kg/m as calculated from the following:   Height as of 03/16/22: 5\' 11"  (1.803 m).   Weight as of 03/16/22: 102.7 kg.  -Weight Loss and Dietary Counseling given   DVT prophylaxis: enoxaparin (LOVENOX) injection 40 mg Start: 12/17/22 2200    Code Status: Full Code Family Communication: No family currently at bedside  Disposition Plan:  Level of care: Med-Surg Status is: Observation The patient will require care spanning > 2 midnights and should be moved to inpatient because: He is going to the OR and will require monitoring overnight   Consultants:  Hand surgery Dr. Frazier Butt  Procedures:  As delineated as above  Antimicrobials:  Anti-infectives (From admission, onward)    Start     Dose/Rate Route Frequency Ordered Stop   12/17/22 1800  Ampicillin-Sulbactam (UNASYN) 3 g in sodium chloride 0.9 % 100 mL IVPB        3 g 200 mL/hr over 30 Minutes Intravenous Every 6 hours 12/17/22 1248     12/17/22 1300  Ampicillin-Sulbactam (UNASYN) 3 g in sodium chloride 0.9 % 100 mL IVPB  Status:  Discontinued        3 g 200 mL/hr over 30 Minutes Intravenous Every 6 hours 12/17/22 1248 12/17/22 1248   12/17/22 1230  Ampicillin-Sulbactam (UNASYN) 3 g in sodium chloride 0.9 % 100 mL IVPB  Status:  Discontinued        3 g 200 mL/hr over 30 Minutes Intravenous  Once 12/17/22 1218 12/17/22 1248   12/17/22 1015  amoxicillin-clavulanate (AUGMENTIN) 875-125 MG per tablet 1 tablet        1 tablet Oral  Once 12/17/22 1002 12/17/22 1103       Subjective: Examined at bedside and his hand was still very swollen and painful.  Denies any nausea or vomiting.  Denies any chest pain or shortness breath.  States that the Ortho hand surgery will be taking him to the OR for an I&D.  No other concerns or complaints at this time  Objective: Vitals:   12/18/22 0413 12/18/22 0752 12/18/22 1317 12/18/22 1535  BP: 130/69 127/71 137/75   Pulse: 74 73 73   Resp: 18 18 18     Temp: 98.9 F (37.2 C) 98.3 F (36.8 C) 98 F (36.7 C)   TempSrc: Oral Oral Oral   SpO2: 94% 97% (!) 82% 96%    Intake/Output Summary (Last 24 hours) at 12/18/2022 1540 Last data filed at 12/18/2022 1502 Gross per 24 hour  Intake 400 ml  Output --  Net 400 ml   There were no vitals filed for this visit.  Examination: Physical Exam:  Constitutional: WN/WD obese Caucasian male in no acute distress Respiratory: Diminished to auscultation bilaterally, no wheezing, rales, rhonchi or crackles. Normal respiratory effort and patient is not tachypenic. No accessory muscle use.  Unlabored breathing Cardiovascular: RRR, no murmurs / rubs / gallops. S1 and S2 auscultated. Abdomen: Soft, non-tender, distended secondary body habitus bowel sounds positive.  GU: Deferred.  Musculoskeletal: No clubbing / cyanosis of digits/nails.  Left hand is extremely swollen and erythematous Skin: No rashes, lesions, ulcers on a limited skin evaluation. No induration; Warm and dry.  Neurologic: CN 2-12 grossly intact with no focal deficits. Romberg sign and cerebellar reflexes not assessed.  Psychiatric: Normal judgment and insight. Alert and oriented x 3. Normal mood and appropriate affect.   Data Reviewed: I have personally reviewed following labs and imaging studies  CBC: Recent Labs  Lab 12/17/22 1044 12/18/22 0130  WBC 12.5* 13.0*  NEUTROABS 9.8*  --   HGB 13.3 12.7*  HCT 38.8* 36.5*  MCV 97.5 96.6  PLT 214 196   Basic Metabolic Panel: Recent Labs  Lab 12/17/22 1044 12/18/22 0130  NA 130* 132*  K 4.0 3.8  CL 101 100  CO2 22 22  GLUCOSE 160* 137*  BUN 22 18  CREATININE 1.11 1.14  CALCIUM 9.2 8.7*   GFR: CrCl cannot be calculated (Unknown ideal weight.). Liver Function Tests: No results for input(s): "AST", "ALT", "ALKPHOS", "BILITOT", "PROT", "ALBUMIN" in the last 168 hours. No results for input(s): "LIPASE", "AMYLASE" in the last 168 hours. No results for input(s): "AMMONIA" in  the last 168 hours. Coagulation Profile: No results for input(s): "INR", "PROTIME" in the last 168 hours. Cardiac Enzymes: No results for input(s): "CKTOTAL", "CKMB", "CKMBINDEX", "TROPONINI" in the last 168 hours. BNP (last 3 results) No results for input(s): "PROBNP" in the last 8760 hours. HbA1C: No results for input(s): "HGBA1C" in the last 72 hours. CBG: Recent Labs  Lab 12/17/22 2015  GLUCAP 123*   Lipid Profile: No results for input(s): "CHOL", "HDL", "LDLCALC", "TRIG", "CHOLHDL", "LDLDIRECT" in the last 72 hours. Thyroid Function Tests: No results for input(s): "TSH", "T4TOTAL", "FREET4", "T3FREE", "THYROIDAB" in the last 72 hours. Anemia Panel: No results for input(s): "VITAMINB12", "FOLATE", "FERRITIN", "TIBC", "IRON", "RETICCTPCT" in the last 72 hours. Sepsis Labs: No results for input(s): "PROCALCITON", "LATICACIDVEN" in the last 168 hours.  No results found for this or any previous visit (from the past 240 hour(s)).   Radiology Studies: DG Hand Complete Left  Result Date: 12/17/2022 CLINICAL DATA:  Bit by a cat EXAM: LEFT HAND - COMPLETE 3+ VIEW COMPARISON:  None Available. FINDINGS: Soft tissue swelling over the posterior aspect of the hand. No fracture, malalignment or radiopaque foreign body. 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 IMPRESSION: Soft tissue swelling without radiopaque foreign body. Concerning for cellulitis. 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 Electronically Signed   By: Malachy Moan M.D.   On: 12/17/2022 10:16    Scheduled Meds:  aspirin EC  81 mg Oral QHS   atorvastatin  40 mg Oral QHS   enoxaparin (LOVENOX) injection  40 mg Subcutaneous Q24H   fenofibrate  160 mg Oral QHS   Continuous Infusions:  ampicillin-sulbactam (UNASYN) IV 3 g (12/18/22 1149)    LOS: 0 days   Marguerita Merles, DO [facility] Available via Epic secure chat 7am-7pm After these hours, please refer to  coverage provider listed on [email or web address] 12/18/2022, 3:40 PM

## 2022-12-18 NOTE — Transfer of Care (Signed)
Immediate Anesthesia Transfer of Care Note  Patient: Jesse Hodges  Procedure(s) Performed: IRRIGATION AND DEBRIDEMENT LEFT HAND AND WRIST (Left)  Patient Location: PACU  Anesthesia Type:General  Level of Consciousness: awake, alert , and drowsy  Airway & Oxygen Therapy: Patient Spontanous Breathing  Post-op Assessment: Report given to RN, Post -op Vital signs reviewed and stable, and Patient moving all extremities X 4  Post vital signs: Reviewed and stable  Last Vitals:  Vitals Value Taken Time  BP 143/72 12/18/22 2036  Temp    Pulse 81 12/18/22 2038  Resp 14 12/18/22 2038  SpO2 95 % 12/18/22 2038  Vitals shown include unvalidated device data.  Last Pain:  Vitals:   12/18/22 1809  TempSrc: Oral  PainSc: 3       Patients Stated Pain Goal: 0 (12/18/22 0917)  Complications: No notable events documented.

## 2022-12-18 NOTE — Anesthesia Preprocedure Evaluation (Addendum)
Anesthesia Evaluation  Patient identified by MRN, date of birth, ID band Patient awake    Reviewed: Allergy & Precautions, H&P , NPO status , Patient's Chart, lab work & pertinent test results  Airway Mallampati: II  TM Distance: >3 FB Neck ROM: Full    Dental  (+) Poor Dentition, Chipped, Missing, Dental Advisory Given   Pulmonary neg pulmonary ROS   Pulmonary exam normal breath sounds clear to auscultation       Cardiovascular hypertension, + CAD and + Cardiac Stents  Normal cardiovascular exam Rhythm:Regular Rate:Normal     Neuro/Psych  Headaches  negative psych ROS   GI/Hepatic negative GI ROS, Neg liver ROS,,,  Endo/Other  negative endocrine ROS    Renal/GU negative Renal ROS     Musculoskeletal   Abdominal   Peds  Hematology negative hematology ROS (+)   Anesthesia Other Findings   Reproductive/Obstetrics negative OB ROS                              Anesthesia Physical Anesthesia Plan  ASA: 3  Anesthesia Plan: General   Post-op Pain Management: Tylenol PO (pre-op)*   Induction: Intravenous  PONV Risk Score and Plan: 2 and Ondansetron, Dexamethasone and Treatment may vary due to age or medical condition  Airway Management Planned: LMA  Additional Equipment:   Intra-op Plan:   Post-operative Plan: Extubation in OR  Informed Consent: I have reviewed the patients History and Physical, chart, labs and discussed the procedure including the risks, benefits and alternatives for the proposed anesthesia with the patient or authorized representative who has indicated his/her understanding and acceptance.     Dental advisory given  Plan Discussed with: CRNA  Anesthesia Plan Comments:         Anesthesia Quick Evaluation

## 2022-12-18 NOTE — Progress Notes (Signed)
Mobility Specialist Progress Note:    12/18/22 1046  Mobility  Activity Ambulated independently in hallway  Level of Assistance Standby assist, set-up cues, supervision of patient - no hands on  Assistive Device None  Distance Ambulated (ft) 550 ft  Activity Response Tolerated well  Mobility Referral Yes  $Mobility charge 1 Mobility  Mobility Specialist Start Time (ACUTE ONLY) 1017  Mobility Specialist Stop Time (ACUTE ONLY) 1028  Mobility Specialist Time Calculation (min) (ACUTE ONLY) 11 min   Pt received in bed, agreeable to ambulate. Pt needed no physical assistance during session. No c/o throughout. Pt assisted back to bed w/ call bell and personal item at reach. All needs met.    Thompson Grayer Mobility Specialist  Please contact vis Secure Chat or  Rehab Office [phone]

## 2022-12-18 NOTE — Interval H&P Note (Signed)
History and Physical Interval Note:  12/18/2022 7:04 PM  Jesse Hodges  has presented today for surgery, with the diagnosis of Cat Bite Left Hand and Wrist.  The various methods of treatment have been discussed with the patient and family. After consideration of risks, benefits and other options for treatment, the patient has consented to  Procedure(s): IRRIGATION AND DEBRIDEMENT LEFT HAND AND WRIST (Left) as a surgical intervention.  The patient's history has been reviewed, patient examined, no change in status, stable for surgery.  I have reviewed the patient's chart and labs.  Questions were answered to the patient's satisfaction.     Butch Otterson Laveyah Oriol

## 2022-12-18 NOTE — H&P (View-Only) (Signed)
 HAND SURGERY CONSULTATION  REQUESTING PHYSICIAN: Sheikh, Omair Latif, DO   Chief Complaint: Left hand/wrist pain and swelling  HPI: This is a 72 y.o. male who presents with dorsal and hand wrist pain and swelling after being bitten by his cat two days ago.  He was seen in the ER yesterday and admitted for IV antibiotics.  He describes pain in the dorsal aspect of the hand and wrist that is worse w/ attempted AROM.  He has no pain in the forearm or fingers.  He denies numbness or paresthesias.  He thinks the pain and swelling have worsened since admission and starting the IV abx yesterday.  He denies any systemic symptoms.   Hand dominance: Right   Past Medical History:  Diagnosis Date   Complication of anesthesia    wife states pt had a massive drop in temperature after one of his back surgeries   Coronary artery disease 2016   1 stent   DDD (degenerative disc disease), lumbar    Dyslipidemia 07/16/2014   Headache    migraines as a child   Hypertension    Low back pain    Past Surgical History:  Procedure Laterality Date   BACK SURGERY     x 4 lumbar region   CORONARY ANGIOPLASTY WITH STENT PLACEMENT  07/17/2014   "1"   IR EPIDUROGRAPHY  02/05/2018   LEFT HEART CATHETERIZATION WITH CORONARY ANGIOGRAM N/A 07/17/2014   Procedure: LEFT HEART CATHETERIZATION WITH CORONARY ANGIOGRAM;  Surgeon: William S Tilley, MD;  Location: [facility] CATH LAB;  Service: Cardiovascular;  Laterality: N/A;   LUMBAR LAMINECTOMY  ~ 1995; ~ 2000; ~ 2002; ~ 2004   LUMBAR LAMINECTOMY/DECOMPRESSION MICRODISCECTOMY Left 02/14/2018   Procedure: Left Lumbar three-four Microdiscectomy;  Surgeon: Elsner, Henry, MD;  Location: [facility] OR;  Service: Neurosurgery;  Laterality: Left;   SHOULDER ARTHROSCOPY Right 02/2007   adhesion release   Social History   Socioeconomic History   Marital status: Married    Spouse name: Not on file   Number of children: Not on file   Years of education: Not on file   Highest education  level: Not on file  Occupational History   Not on file  Tobacco Use   Smoking status: Never   Smokeless tobacco: Never  Vaping Use   Vaping Use: Never used  Substance and Sexual Activity   Alcohol use: Yes    Alcohol/week: 0.0 standard drinks of alcohol    Comment: 07/17/2014 "a glass of wine a few times/yr"   Drug use: No   Sexual activity: Yes  Other Topics Concern   Not on file  Social History Narrative   Independent at baseline.  Lives at home with family   Social Determinants of Health   Financial Resource Strain: Not on file  Food Insecurity: Not on file  Transportation Needs: Not on file  Physical Activity: Not on file  Stress: Not on file  Social Connections: Not on file   Family History  Problem Relation Age of Onset   Hypertension Father    - negative except otherwise stated in the family history section No Known Allergies Prior to Admission medications   Medication Sig Start Date End Date Taking? Authorizing Provider  aspirin EC 81 MG tablet Take 81 mg by mouth at bedtime.   Yes [provider]  atorvastatin (LIPITOR) 40 MG tablet Take 1 tablet (40 mg total) by mouth daily. Patient taking differently: Take 40 mg by mouth at bedtime. 03/17/22  Yes Christopher,   Bridgette, MD  fenofibrate 160 MG tablet TAKE 1 TABLET BY MOUTH EVERY DAY Patient taking differently: Take 160 mg by mouth at bedtime. 03/17/22  Yes Christopher, Bridgette, MD  ibuprofen (ADVIL) 200 MG tablet Take 200 mg by mouth every 6 (six) hours as needed for mild pain or moderate pain.   Yes [provider]  olmesartan (BENICAR) 5 MG tablet TAKE 1 TABLET BY MOUTH EVERY DAY Patient taking differently: Take by mouth at bedtime. 03/17/22  Yes Christopher, Bridgette, MD   DG Hand Complete Left  Result Date: 12/17/2022 CLINICAL DATA:  Bit by a cat EXAM: LEFT HAND - COMPLETE 3+ VIEW COMPARISON:  None Available. FINDINGS: Soft tissue swelling over the posterior aspect of the hand. No  fracture, malalignment or radiopaque foreign body. 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 IMPRESSION: Soft tissue swelling without radiopaque foreign body. Concerning for cellulitis. 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 Electronically Signed   By: Heath  McCullough M.D.   On: 12/17/2022 10:16   - Positive ROS: All other systems have been reviewed and were otherwise negative with the exception of those mentioned in the HPI and as above.  Physical Exam: General: No acute distress, resting comfortably Cardiovascular: BUE warm and well perfused, normal rate Respiratory: Normal WOB on RA Skin: Warm and dry Neurologic: Sensation intact distally Psychiatric: Patient is at baseline mood and affect  Right Upper Extremity  Moderate, diffuse swelling of dorsal hand and wrist.  Two small puncture wounds with one at the dorsal and central aspect of proximal hand and the second at the dorsal and radial aspect of the wrist around the EPL tendon.  There is no drainage from these small wounds.  There is no drainage. AROM of wrist is limited by both pain and swelling. AROM of his fingers is limited by swelling but without pain or discomfort.  SILT in median, ulnar, and radial distribution.  His hand is warm and well perfused w/ BCR.   Assessment: 72 yo RHD M w/ cat bite to the dorsum of the left hand and wrist.  His pain and swelling seem to be worsening despite IV abx.   Plan: - Plan for OR this evening for irrigation and debridement of the dorsal hand and wrist given patient's worsening pain and swelling despite IV abx. - We reviewed the risks of surgery which include bleeding, worsening infection, delayed wound healing, damage to surrounding neurovascular structures, persistent swelling, persistent pain, need for additional surgery.  - Encouraged patient to keep extremity elevated to help with pain and swelling - Additional plan to follow pending OR  findings    Analaura Messler Naia Ruff, M.D. [facility] 8:44 AM     

## 2022-12-18 NOTE — Anesthesia Procedure Notes (Signed)
Procedure Name: LMA Insertion Date/Time: 12/18/2022 7:18 PM  Performed by: Aundria Rud, CRNAPre-anesthesia Checklist: Patient identified, Emergency Drugs available, Suction available and Patient being monitored Patient Re-evaluated:Patient Re-evaluated prior to induction Oxygen Delivery Method: Circle System Utilized Preoxygenation: Pre-oxygenation with 100% oxygen Induction Type: IV induction Ventilation: Mask ventilation without difficulty LMA: LMA inserted LMA Size: 5.0 Number of attempts: 1 Airway Equipment and Method: Bite block Placement Confirmation: positive ETCO2 and CO2 detector Tube secured with: Tape Dental Injury: Teeth and Oropharynx as per pre-operative assessment

## 2022-12-18 NOTE — Op Note (Signed)
Date of Surgery: 12/18/2022  INDICATIONS: Patient is a 72 y.o.-year-old male with a cat bite to the dorsal aspect of the left wrist and hand.  He was admitted last night with mild erythema and swelling of the dorsal hand and wrist.  He was started on IV antibiotics.  Unfortunately, his pain and swelling have worsened since admission.  Given his worsening wrist pain and the location of the bite marks close to the wrist joint, I am concerned for a dorsal hand abscess and septic arthritis and have recommended irrigation and debridement.   Risks, benefits, and alternatives to surgery were again discussed with the patient in the preoperative area. The patient wishes to proceed with surgery.  Informed consent was signed after our discussion.   PREOPERATIVE DIAGNOSIS:  Left dorsal hand abscess Left wrist septic arthritis  POSTOPERATIVE DIAGNOSIS: Same.  PROCEDURE:  Irrigation and debridement of cat bites on dorsum of hand Irrigation and excisional debridement of dorsal hand abscess (10061) Left radiocarpal and midcarpal arthrotomy for irrigation of infection (16109)   SURGEON: Waylan Rocher, M.D.  ASSIST: None  ANESTHESIA:  Regional + MAC  IV FLUIDS AND URINE: See anesthesia.  ESTIMATED BLOOD LOSS: <5 mL.  IMPLANTS: * No implants in log *   DRAINS: Penrose x 1  COMPLICATIONS: None  SPECIMENS: Wrist synovial fluid swabs for microbiology  DESCRIPTION OF PROCEDURE: The patient was met in the preoperative holding area where the surgical site was marked and the consent form was signed.  The patient was then taken to the operating room and transferred to the operating table.  All bony prominences were well padded.  A tourniquet was applied to the left upper arm.  General endotracheal anesthesia was induced.  The operative extremity was prepped and draped in the usual and sterile fashion.  A formal time-out was performed to confirm that this was the correct patient, surgery, side, and site.    Following formal timeout, the limb was gently exsanguinated by gravity and the tourniquet inflated to 250 mmHg.  I began by making a longitudinal incision approximately 3 and half centimeters in length centered over the bite mark at the dorsal radial aspect of the wrist.  The skin was incised.  A small crossing vein was coagulated with bipolar cautery.  Blunt dissection was used to identify the interval between the EPL and radial wrist extensor tendons.  Was no purulence in this area.  The capsule was identified.  The capsule appeared somewhat tense.  A transverse arthrotomy was performed at the radiocarpal joint.  There was an immediate rush of turbid straw-colored synovial fluid.  There was no significant synovitis present.  A second small transverse incision was made over the midcarpal joint.  Again there was a large amount of turbid straw-colored fluid present.  Culture swabs were taken of this fluid.  I used a 16-gauge Angiocath to thoroughly irrigate the entirety of the radiocarpal and midcarpal joints with copious sterile saline. I then turned my attention to the second bite mark at the dorsal central aspect of the hand.  An approximately 3 cm longitudinal incision was made centered over this bite mark.  The skin was incised.  There was immediate gross purulence encountered.  The extensor digitorum tendons were identified.  There was a moderate degree of surrounding synovitis.  The synovitis was sharply excised using a pickup and tenotomy scissor.  There was some necrotic appearing fat in the wound bed.  This was debrided sharply using a combination of scissor and  rongeur.  This wound was thoroughly irrigated with copious sterile saline.  At this point, the tourniquet was deflated.  Hemostasis was achieved with combination of direct pressure over the wounds with bipolar cautery.  1/4 inch Penrose drain was placed at the interval between the EPL and radial wrist extensors in the area of the capsulotomy.   Wound was loosely reapproximated with two 4-0 nylon sutures.  The more dorsal and distal wound was similarly closed with a single 4-0 nylon suture.  Wounds were then dressed with folded Kerlix and a 3 inch Ace wrap.  The patient was reversed from anesthesia and extubated uneventfully.  They were transferred from the operating table to the postoperative bed.  All counts were correct x 2 at the end of the procedure.  The patient was then taken to the PACU in stable condition.   POSTOPERATIVE PLAN: Patient will return to his room on the floor.  He will continue IV antibiotics.  I will follow the intraoperative cultures.  We will start wound care tomorrow with soaks of the hand in warm, diluted Hibiclens solution.  Disposition pending improvement of his symptoms and final antibiotic choice.  Waylan Rocher, MD 8:16 PM

## 2022-12-18 NOTE — Plan of Care (Signed)

## 2022-12-18 NOTE — Consult Note (Signed)
HAND SURGERY CONSULTATION  REQUESTING PHYSICIAN: Merlene Laughter, DO   Chief Complaint: Left hand/wrist pain and swelling  HPI: This is a 72 y.o. male who presents with dorsal and hand wrist pain and swelling after being bitten by his cat two days ago.  He was seen in the ER yesterday and admitted for IV antibiotics.  He describes pain in the dorsal aspect of the hand and wrist that is worse w/ attempted AROM.  He has no pain in the forearm or fingers.  He denies numbness or paresthesias.  He thinks the pain and swelling have worsened since admission and starting the IV abx yesterday.  He denies any systemic symptoms.   Hand dominance: Right   Past Medical History:  Diagnosis Date   Complication of anesthesia    wife states pt had a massive drop in temperature after one of his back surgeries   Coronary artery disease 2016   1 stent   DDD (degenerative disc disease), lumbar    Dyslipidemia 07/16/2014   Headache    migraines as a child   Hypertension    Low back pain    Past Surgical History:  Procedure Laterality Date   BACK SURGERY     x 4 lumbar region   CORONARY ANGIOPLASTY WITH STENT PLACEMENT  07/17/2014   "1"   IR EPIDUROGRAPHY  02/05/2018   LEFT HEART CATHETERIZATION WITH CORONARY ANGIOGRAM N/A 07/17/2014   Procedure: LEFT HEART CATHETERIZATION WITH CORONARY ANGIOGRAM;  Surgeon: Othella Boyer, MD;  Location: [facility] CATH LAB;  Service: Cardiovascular;  Laterality: N/A;   LUMBAR LAMINECTOMY  ~ 1995; ~ 2000; ~ 2002; ~ 2004   LUMBAR LAMINECTOMY/DECOMPRESSION MICRODISCECTOMY Left 02/14/2018   Procedure: Left Lumbar three-four Microdiscectomy;  Surgeon: Barnett Abu, MD;  Location: [facility] OR;  Service: Neurosurgery;  Laterality: Left;   SHOULDER ARTHROSCOPY Right 02/2007   adhesion release   Social History   Socioeconomic History   Marital status: Married    Spouse name: Not on file   Number of children: Not on file   Years of education: Not on file   Highest education  level: Not on file  Occupational History   Not on file  Tobacco Use   Smoking status: Never   Smokeless tobacco: Never  Vaping Use   Vaping Use: Never used  Substance and Sexual Activity   Alcohol use: Yes    Alcohol/week: 0.0 standard drinks of alcohol    Comment: 07/17/2014 "a glass of wine a few times/yr"   Drug use: No   Sexual activity: Yes  Other Topics Concern   Not on file  Social History Narrative   Independent at baseline.  Lives at home with family   Social Determinants of Health   Financial Resource Strain: Not on file  Food Insecurity: Not on file  Transportation Needs: Not on file  Physical Activity: Not on file  Stress: Not on file  Social Connections: Not on file   Family History  Problem Relation Age of Onset   Hypertension Father    - negative except otherwise stated in the family history section No Known Allergies Prior to Admission medications   Medication Sig Start Date End Date Taking? Authorizing Provider  aspirin EC 81 MG tablet Take 81 mg by mouth at bedtime.   Yes [provider]  atorvastatin (LIPITOR) 40 MG tablet Take 1 tablet (40 mg total) by mouth daily. Patient taking differently: Take 40 mg by mouth at bedtime. 03/17/22  Yes Cristal Deer,  Bridgette, MD  fenofibrate 160 MG tablet TAKE 1 TABLET BY MOUTH EVERY DAY Patient taking differently: Take 160 mg by mouth at bedtime. 03/17/22  Yes Jodelle Red, MD  ibuprofen (ADVIL) 200 MG tablet Take 200 mg by mouth every 6 (six) hours as needed for mild pain or moderate pain.   Yes [provider]  olmesartan (BENICAR) 5 MG tablet TAKE 1 TABLET BY MOUTH EVERY DAY Patient taking differently: Take by mouth at bedtime. 03/17/22  Yes Jodelle Red, MD   DG Hand Complete Left  Result Date: 12/17/2022 CLINICAL DATA:  Bit by a cat EXAM: LEFT HAND - COMPLETE 3+ VIEW COMPARISON:  None Available. FINDINGS: Soft tissue swelling over the posterior aspect of the hand. No  fracture, malalignment or radiopaque foreign body. 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 IMPRESSION: Soft tissue swelling without radiopaque foreign body. Concerning for cellulitis. 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 Electronically Signed   By: Malachy Moan M.D.   On: 12/17/2022 10:16   - Positive ROS: All other systems have been reviewed and were otherwise negative with the exception of those mentioned in the HPI and as above.  Physical Exam: General: No acute distress, resting comfortably Cardiovascular: BUE warm and well perfused, normal rate Respiratory: Normal WOB on RA Skin: Warm and dry Neurologic: Sensation intact distally Psychiatric: Patient is at baseline mood and affect  Right Upper Extremity  Moderate, diffuse swelling of dorsal hand and wrist.  Two small puncture wounds with one at the dorsal and central aspect of proximal hand and the second at the dorsal and radial aspect of the wrist around the EPL tendon.  There is no drainage from these small wounds.  There is no drainage. AROM of wrist is limited by both pain and swelling. AROM of his fingers is limited by swelling but without pain or discomfort.  SILT in median, ulnar, and radial distribution.  His hand is warm and well perfused w/ BCR.   Assessment: 54 yo RHD M w/ cat bite to the dorsum of the left hand and wrist.  His pain and swelling seem to be worsening despite IV abx.   Plan: - Plan for OR this evening for irrigation and debridement of the dorsal hand and wrist given patient's worsening pain and swelling despite IV abx. - We reviewed the risks of surgery which include bleeding, worsening infection, delayed wound healing, damage to surrounding neurovascular structures, persistent swelling, persistent pain, need for additional surgery.  - Encouraged patient to keep extremity elevated to help with pain and swelling - Additional plan to follow pending OR  findings    Marlyne Beards, M.D. [facility] 8:44 AM

## 2022-12-19 ENCOUNTER — Encounter (HOSPITAL_COMMUNITY): Payer: Self-pay | Admitting: Orthopedic Surgery

## 2022-12-19 DIAGNOSIS — M5136 Other intervertebral disc degeneration, lumbar region: Secondary | ICD-10-CM | POA: Diagnosis not present

## 2022-12-19 DIAGNOSIS — E871 Hypo-osmolality and hyponatremia: Secondary | ICD-10-CM | POA: Diagnosis not present

## 2022-12-19 DIAGNOSIS — L03114 Cellulitis of left upper limb: Secondary | ICD-10-CM | POA: Diagnosis not present

## 2022-12-19 DIAGNOSIS — S61459A Open bite of unspecified hand, initial encounter: Secondary | ICD-10-CM | POA: Diagnosis not present

## 2022-12-19 DIAGNOSIS — L02519 Cutaneous abscess of unspecified hand: Secondary | ICD-10-CM

## 2022-12-19 LAB — COMPREHENSIVE METABOLIC PANEL
ALT: 25 U/L (ref 0–44)
AST: 21 U/L (ref 15–41)
Albumin: 3.3 g/dL — ABNORMAL LOW (ref 3.5–5.0)
Alkaline Phosphatase: 33 U/L — ABNORMAL LOW (ref 38–126)
Anion gap: 10 (ref 5–15)
BUN: 19 mg/dL (ref 8–23)
CO2: 23 mmol/L (ref 22–32)
Calcium: 8.9 mg/dL (ref 8.9–10.3)
Chloride: 103 mmol/L (ref 98–111)
Creatinine, Ser: 1.21 mg/dL (ref 0.61–1.24)
GFR, Estimated: >60 mL/min (ref >60)
Glucose, Bld: 159 mg/dL — ABNORMAL HIGH (ref 70–99)
Potassium: 4.3 mmol/L (ref 3.5–5.1)
Sodium: 136 mmol/L (ref 135–145)
Total Bilirubin: 1 mg/dL (ref 0.3–1.2)
Total Protein: 6.2 g/dL — ABNORMAL LOW (ref 6.5–8.1)

## 2022-12-19 LAB — CBC WITH DIFFERENTIAL/PLATELET
Abs Immature Granulocytes: 0.07 10*3/uL (ref 0.00–0.07)
Basophils Absolute: 0 10*3/uL (ref 0.0–0.1)
Basophils Relative: 0 %
Eosinophils Absolute: 0 10*3/uL (ref 0.0–0.5)
Eosinophils Relative: 0 %
HCT: 37 % — ABNORMAL LOW (ref 39.0–52.0)
Hemoglobin: 12.7 g/dL — ABNORMAL LOW (ref 13.0–17.0)
Immature Granulocytes: 1 %
Lymphocytes Relative: 10 %
Lymphs Abs: 1.3 10*3/uL (ref 0.7–4.0)
MCH: 33.7 pg (ref 26.0–34.0)
MCHC: 34.3 g/dL (ref 30.0–36.0)
MCV: 98.1 fL (ref 80.0–100.0)
Monocytes Absolute: 1.5 10*3/uL — ABNORMAL HIGH (ref 0.1–1.0)
Monocytes Relative: 12 %
Neutro Abs: 9.6 10*3/uL — ABNORMAL HIGH (ref 1.7–7.7)
Neutrophils Relative %: 77 %
Platelets: 195 10*3/uL (ref 150–400)
RBC: 3.77 MIL/uL — ABNORMAL LOW (ref 4.22–5.81)
RDW: 12.1 % (ref 11.5–15.5)
WBC: 12.5 10*3/uL — ABNORMAL HIGH (ref 4.0–10.5)
nRBC: 0 % (ref 0.0–0.2)

## 2022-12-19 LAB — AEROBIC/ANAEROBIC CULTURE W GRAM STAIN (SURGICAL/DEEP WOUND)

## 2022-12-19 LAB — MAGNESIUM: Magnesium: 1.9 mg/dL (ref 1.7–2.4)

## 2022-12-19 LAB — PHOSPHORUS: Phosphorus: 2.3 mg/dL — ABNORMAL LOW (ref 2.5–4.6)

## 2022-12-19 MED ORDER — IRBESARTAN 75 MG PO TABS
37.5000 mg | ORAL_TABLET | Freq: Every day | ORAL | Status: DC
  Administered 2022-12-19 – 2022-12-21 (×3): 37.5 mg via ORAL
  Filled 2022-12-19 (×3): qty 0.5

## 2022-12-19 MED ORDER — CHLORHEXIDINE GLUCONATE 4 % EX SOLN
Freq: Two times a day (BID) | CUTANEOUS | Status: DC
  Filled 2022-12-19: qty 15
  Filled 2022-12-19: qty 30
  Filled 2022-12-19 (×3): qty 15

## 2022-12-19 MED ORDER — K PHOS MONO-SOD PHOS DI & MONO 155-852-130 MG PO TABS
500.0000 mg | ORAL_TABLET | Freq: Once | ORAL | Status: AC
  Administered 2022-12-19: 500 mg via ORAL
  Filled 2022-12-19: qty 2

## 2022-12-19 NOTE — Progress Notes (Signed)
   Subjective:  Pt resting comfortably on edge of bed eating breakfast.  Pain in hand and wrist improved.  Patient able to use operative extremity to help eat breakfast.  Denies systemic symptoms.   Objective:   VITALS:   Vitals:   12/18/22 2045 12/18/22 2100 12/18/22 2359 12/19/22 0754  BP: 134/84 131/81 122/71 (!) 140/83  Pulse: 82 81 86 75  Resp: 13 15 17 18   Temp:  98.8 F (37.1 C) 98.2 F (36.8 C) 98 F (36.7 C)  TempSrc:      SpO2: 96% 95% 98% 98%  Weight:      Height:        Gen: NAD, resting comfortably Pulm: Normal WOB on RA CV: Normal rate, BUE warm and well perfused LUE: Two longitudinal incisions on dorsal radial wrist and dorsal ulnar hand are clean and dry.  Two nylon sutures in place.  No erythema.  Swelling appears improved.  No pain w/ AROM of wrist but limited by swelling and stiffness.  SILT m/u/r distribution.  Hand warm and well perfused.     Lab Results  Component Value Date   WBC 13.0 (H) 12/18/2022   HGB 12.7 (L) 12/18/2022   HCT 36.5 (L) 12/18/2022   MCV 96.6 12/18/2022   PLT 196 12/18/2022     Assessment/Plan:  72 yo M POD 1 s/p left dorsal hand and wrist I&D w/ radiocarpal and midcarpal arthrotomy for infection after cat bite.  - Continue IV abx - Intra-op cultures pending - Will start BID wound care with warm, diluted Hibiclens solution - Potential DC tomorrow pending improvement in patient's pain and swelling   Marlyne Beards, MD 12/19/2022, 8:00 AM [phone]

## 2022-12-19 NOTE — Progress Notes (Signed)
Mobility Specialist Progress Note   12/19/22 1415  Mobility  Activity Ambulated independently in hallway  Level of Assistance Modified independent, requires aide device or extra time  Assistive Device None  Distance Ambulated (ft) 550 ft  Range of Motion/Exercises Active;All extremities  Activity Response Tolerated well   Patient received in chair and agreeable to participate. Ambulated mod I with slow steady gait. Returned to room without complaint or incident. Was left dangling EOB with all needs met, call bell in reach.   Jesse Hodges, BS EXP Mobility Specialist Please contact via SecureChat or Rehab office at [phone]

## 2022-12-19 NOTE — TOC Initial Note (Addendum)
Transition of Care [facility]) - Initial/Assessment Note    Patient Details  Name: Jesse Hodges MRN: [id number] Date of Birth: August 07, 1950  Transition of Care [facility]) CM/SW Contact:    Lawerance Sabal, RN Phone Number: 12/19/2022, 11:21 AM  Clinical Narrative:                 Independent patient from home admitted with infected cat bite to hand.  I&D yesterday DC tomorrow with patient managing dressing changes. Confirmed with patient that he will have assistance at home for dressing changes. No HH needs anticipated  Expected Discharge Plan: Home/Self Care Barriers to Discharge: Continued Medical Work up   Patient Goals and CMS Choice            Expected Discharge Plan and Services                                              Prior Living Arrangements/Services                       Activities of Daily Living      Permission Sought/Granted                  Emotional Assessment              Admission diagnosis:  Cellulitis of left hand [L03.114] Cellulitis of hand, left [L03.114] Cat bite, initial encounter [W55.01XA] Cat bite of hand, initial encounter [S61.459A, W55.01XA] Patient Active Problem List   Diagnosis Date Noted   Cat bite of hand, initial encounter 12/18/2022   Hyponatremia 12/17/2022   Cellulitis of hand, left due to cat bite 12/17/2022   Leukocytosis 12/17/2022   Hypertriglyceridemia 05/17/2018   Lumbar radiculopathy, acute 02/14/2018   Essential hypertension 02/07/2018   Pressure injury of skin 02/03/2018   Spinal column pain 01/30/2018   Chronic left lumbar radiculopathy 01/30/2018   Spinal stenosis of lumbar region 01/30/2018   Constipation due to pain medication therapy 01/30/2018   Low back pain 01/28/2018   CAD (coronary artery disease), native coronary artery 07/17/2014   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 [place] [address] Phone: [phone] Fax: [phone]  [id number] - [place], [place] - [address] [place] [address] Phone: [phone] Fax: [phone]     Social Determinants of Health (SDOH) Social History: SDOH Screenings   Tobacco Use: Low Risk  (12/19/2022)   SDOH Interventions:     Readmission Risk Interventions     No data to display

## 2022-12-19 NOTE — Progress Notes (Addendum)
PROGRESS NOTE    Jesse Hodges  ZOX:[id number] DOB: 10-13-1950 DOA: 12/17/2022 PCP: Clayborn Heron, MD   Brief Narrative:  The patient is a 72 year old Caucasian male with a past medical history significant for but limited to urinary disc disease, hypertension, hyperlipidemia, history of CAD status post PCI as well as other comorbidities presented to ED secondary to cat bite to his left hand with progressively worsening pain and swelling.  Patient is right-handed and the cat was vaccinated apparently for rabies and had routine shots.  The cat gotten rabies shot last week and the patient has been maintaining the feral cat for almost 2 years.  Yesterday afternoon around 4 PM he was helping his wife and got a cat but the cat bit him on the top of the left hand.  He cleaned it with water and hydroperoxide and then Polysporin.  Overnight and woke him up around 4 AM due to the pain progressively got worse.  Patient states that he is able to move his fingers and has been feeling good prior to this.  He denied nausea, chills, vomiting, diarrhea.  [place] been [place] a lot and had his Tdap given.  He does not smoke or drink any alcohol and presented with the above complaint and hand surgery was consulted and they are taking him to the OR given that he is placed on IV antibiotics and the hand has not improved.  **He is POD1 and Improving and Hand Surgery recommending continuing IV Abx today.   Assessment and Plan:  Left hand and wrist pain and swelling and cellulitis in the setting of a cat bite s/p I and D, Excisional Debridement of the Dorsal hand Abscess, and Left Radiocarpal and Midcarpal Arthrotomy for Irrigation of Infection POD1 -The Patient is a 72 year old Caucasian male presenting with 1 day history of a cat bite with his left hand.  Secondary worsening pain -Given Tdap in the ED -The cat was a feral cat but apparently he received rabies vaccine last week  and family has no known cat with no history of erratic behavior -Placed on IV antibiotics with IV Unasyn and will continue today and his dorsal hand is not as  swollen after surgery  -Continue with elevation in his pulses flexion and extension seem to be intact -WBC Trend: Recent Labs  Lab 12/17/22 1044 12/18/22 0130 12/19/22 0921  WBC 12.5* 13.0* 12.5*  -Current Aerobic/Anaerobic Cx pending   -Continue icing and elevation and acetaminophen and narcotic medication if necessary -Hand surgery was consulted and took the patient irrigation debridement given his worsening pain and swelling despite the antibiotics -Continue elevation to help with pain and swelling and further care and direction per Hand Surgery -Hand Surgery recommending continuing IV Abx today, following Cx, starting BID wound Care with Warm, diluted Hibiclens solution and likely D/C tomorrow (6/19) pending further improvement   Hyponatremia, improved -[place] and states he has been outside sweating a lot -Na+ Trend: Recent Labs  Lab 12/17/22 1044 12/18/22 0130 12/19/22 0921  NA 130* 132* 136  -Was given gentle time-limited fluids and this has now stopped but may consider resuming -Continue To monitor and trend and repeat CMP in the a.m. if not improving will consider further workup with urine studies   Leukocytosis -Cellulitis vs. Some dehydration  -No other SIRS criteria -WBC Trend: Recent Labs  Lab 12/17/22 1044 12/18/22 0130 12/19/22 0921  WBC 12.5* 13.0* 12.5*  -IV  Unasyn and unfortunately no cultures were obtained on admission -To monitor for signs or symptoms infection and patient is going to the OR and will need further cultures to be sent  Hypophosphatemia -Phos Level Trend: Recent Labs  Lab 12/19/22 0921  PHOS 2.3*  -Replete with po K Phos Neutral 500 mg po x1 -Continue to Monitor and Trend and Replete as Necessary -Repeat Phos Level in the AM   Essential Hypertension -Held Benicar  with Soft BP initially but will resume today with Irebesartan 37.5 mg po Daily Substitution  -Continue monitor blood pressures per protocol -Last blood pressure reading was 140/83   CAD (coronary artery disease), native coronary artery -Continue medical management -ASA, Statin -Monitor for CP   Dyslipidemia  -C/w Atorvastatin 40 mg po at bedtime and Fenofibrate 160 mg po qHS  Hyperglycemia -Check HbA1c -Continue to Monitor CBGs per Protocol and if Necessary will place on Sensitive Novolog SSI AC -CBG/Glucose Trend: Recent Labs  Lab 12/17/22 1044 12/17/22 2015 12/18/22 0130 12/19/22 0921  GLUCOSE 160*  --  137* 159*  GLUCAP  --  123*  --   --    Normocytic Anemia -Hgb/Hct Trend: Recent Labs  Lab 12/17/22 1044 12/18/22 0130 12/19/22 0921  HGB 13.3 12.7* 12.7*  HCT 38.8* 36.5* 37.0*  MCV 97.5 96.6 98.1  -Check Anemia Panel in the AM -Continue to Monitor for S/Sx of Bleeding; No overt bleeding noted -Repeat CBC in the AM  Hypoalbuminemia -Patient's Albumin Trend: Recent Labs  Lab 12/19/22 0921  ALBUMIN 3.3*  -Continue to Monitor and Trend and repeat CMP in the AM   Obesity -Complicates overall prognosis and care -Estimated body mass index is 30.13 kg/m as calculated from the following:   Height as of this encounter: 5\' 10"  (1.778 m).   Weight as of this encounter: 95.3 kg.  -Weight Loss and Dietary Counseling given   DVT prophylaxis: enoxaparin (LOVENOX) injection 40 mg Start: 12/17/22 2200    Code Status: Full Code Family Communication: No family present at bedside   Disposition Plan:  Level of care: Med-Surg Status is: Inpatient Remains inpatient appropriate because: Hand Surgery recommending continuing IV Abx and needs Further clinical improvement and Hand Surgery Clearance prior to D/C   Consultants:  Hand Surgery Dr. Frazier Butt   Procedures:  PROCEDURE:  Irrigation and debridement of cat bites on dorsum of hand Irrigation and excisional  debridement of dorsal hand abscess (10061) Left radiocarpal and midcarpal arthrotomy for irrigation of infection ([address])  Antimicrobials:  Anti-infectives (From admission, onward)    Start     Dose/Rate Route Frequency Ordered Stop   12/17/22 1800  Ampicillin-Sulbactam (UNASYN) 3 g in sodium chloride 0.9 % 100 mL IVPB        3 g 200 mL/hr over 30 Minutes Intravenous Every 6 hours 12/17/22 1248     12/17/22 1300  Ampicillin-Sulbactam (UNASYN) 3 g in sodium chloride 0.9 % 100 mL IVPB  Status:  Discontinued        3 g 200 mL/hr over 30 Minutes Intravenous Every 6 hours 12/17/22 1248 12/17/22 1248   12/17/22 1230  Ampicillin-Sulbactam (UNASYN) 3 g in sodium chloride 0.9 % 100 mL IVPB  Status:  Discontinued        3 g 200 mL/hr over 30 Minutes Intravenous  Once 12/17/22 1218 12/17/22 1248   12/17/22 1015  amoxicillin-clavulanate (AUGMENTIN) 875-125 MG per tablet 1 tablet        1 tablet Oral  Once 12/17/22 1002 12/17/22 1103  Subjective: Seen and examined at bedside and hand was doing a little bit better. No CP or SOB. States pain in hand is 3 or 4/10 in severity and has more motion with lesser pain. Hoping to go home tomorrow. No other concerns or complaints at this time.  Objective: Vitals:   12/18/22 2045 12/18/22 2100 12/18/22 2359 12/19/22 0754  BP: 134/84 131/81 122/71 (!) 140/83  Pulse: 82 81 86 75  Resp: 13 15 17 18   Temp:  98.8 F (37.1 C) 98.2 F (36.8 C) 98 F (36.7 C)  TempSrc:      SpO2: 96% 95% 98% 98%  Weight:      Height:        Intake/Output Summary (Last 24 hours) at 12/19/2022 1130 Last data filed at 12/18/2022 2015 Gross per 24 hour  Intake 1400 ml  Output --  Net 1400 ml   Filed Weights   12/18/22 1809  Weight: 95.3 kg   Examination: Physical Exam:  Constitutional: WN/WD obese Caucasian male in NAD Respiratory: Diminished to auscultation bilaterally, no wheezing, rales, rhonchi or crackles. Normal respiratory effort and patient is not  tachypenic. No accessory muscle use. Unlabored breathing  Cardiovascular: RRR, no murmurs / rubs / gallops. S1 and S2 auscultated.  Abdomen: Soft, non-tender, Distended 2/2 body habitus. Bowel sounds positive.  GU: Deferred. Musculoskeletal: No clubbing / cyanosis of digits/nails. Left hand is wrapped and a little swollen. Skin: No rashes, lesions, ulcers on a limited skin evaluation. No induration; Warm and dry.  Neurologic: CN 2-12 grossly intact with no focal deficits.  Romberg sign and cerebellar reflexes not assessed.  Psychiatric: Normal judgment and insight. Alert and oriented x 3. Normal mood and appropriate affect.   Data Reviewed: I have personally reviewed following labs and imaging studies  CBC: Recent Labs  Lab 12/17/22 1044 12/18/22 0130 12/19/22 0921  WBC 12.5* 13.0* 12.5*  NEUTROABS 9.8*  --  9.6*  HGB 13.3 12.7* 12.7*  HCT 38.8* 36.5* 37.0*  MCV 97.5 96.6 98.1  PLT 214 196 195   Basic Metabolic Panel: Recent Labs  Lab 12/17/22 1044 12/18/22 0130 12/19/22 0921  NA 130* 132* 136  K 4.0 3.8 4.3  CL 101 100 103  CO2 22 22 23   GLUCOSE 160* 137* 159*  BUN 22 18 19   CREATININE 1.11 1.14 1.21  CALCIUM 9.2 8.7* 8.9  MG  --   --  1.9  PHOS  --   --  2.3*   GFR: Estimated Creatinine Clearance: 64.9 mL/min (by C-G formula based on SCr of 1.21 mg/dL). Liver Function Tests: Recent Labs  Lab 12/19/22 0921  AST 21  ALT 25  ALKPHOS 33*  BILITOT 1.0  PROT 6.2*  ALBUMIN 3.3*   No results for input(s): "LIPASE", "AMYLASE" in the last 168 hours. No results for input(s): "AMMONIA" in the last 168 hours. Coagulation Profile: No results for input(s): "INR", "PROTIME" in the last 168 hours. Cardiac Enzymes: No results for input(s): "CKTOTAL", "CKMB", "CKMBINDEX", "TROPONINI" in the last 168 hours. BNP (last 3 results) No results for input(s): "PROBNP" in the last 8760 hours. HbA1C: No results for input(s): "HGBA1C" in the last 72 hours. CBG: Recent Labs  Lab  12/17/22 2015  GLUCAP 123*   Lipid Profile: No results for input(s): "CHOL", "HDL", "LDLCALC", "TRIG", "CHOLHDL", "LDLDIRECT" in the last 72 hours. Thyroid Function Tests: No results for input(s): "TSH", "T4TOTAL", "FREET4", "T3FREE", "THYROIDAB" in the last 72 hours. Anemia Panel: No results for input(s): "VITAMINB12", "FOLATE", "FERRITIN", "TIBC", "  IRON", "RETICCTPCT" in the last 72 hours. Sepsis Labs: No results for input(s): "PROCALCITON", "LATICACIDVEN" in the last 168 hours.  Recent Results (from the past 240 hour(s))  Aerobic/Anaerobic Culture w Gram Stain (surgical/deep wound)     Status: None (Preliminary result)   Collection Time: 12/18/22  8:31 PM   Specimen: Hand, Left; Wound  Result Value Ref Range Status   Specimen Description HAND  Final   Special Requests NONE  Final   Gram Stain   Final    FEW WBC PRESENT,BOTH PMN AND MONONUCLEAR RARE SQUAMOUS EPITHELIAL CELLS PRESENT NO ORGANISMS SEEN Performed at [facility], [address], [place], [place] [address]    Culture PENDING  Incomplete   Report Status PENDING  Incomplete     Radiology Studies: No results found.  Scheduled Meds:  aspirin EC  81 mg Oral QHS   atorvastatin  40 mg Oral QHS   chlorhexidine   Topical BID   enoxaparin (LOVENOX) injection  40 mg Subcutaneous Q24H   fenofibrate  160 mg Oral QHS   irbesartan  37.5 mg Oral Daily   Continuous Infusions:  ampicillin-sulbactam (UNASYN) IV 3 g (12/19/22 0602)    LOS: 1 day   Marguerita Merles, DO [facility] Available via Epic secure chat 7am-7pm After these hours, please refer to coverage provider listed on [email or web address] 12/19/2022, 11:30 AM

## 2022-12-20 DIAGNOSIS — E871 Hypo-osmolality and hyponatremia: Secondary | ICD-10-CM | POA: Diagnosis not present

## 2022-12-20 DIAGNOSIS — E785 Hyperlipidemia, unspecified: Secondary | ICD-10-CM | POA: Diagnosis not present

## 2022-12-20 DIAGNOSIS — I2510 Atherosclerotic heart disease of native coronary artery without angina pectoris: Secondary | ICD-10-CM | POA: Diagnosis not present

## 2022-12-20 DIAGNOSIS — L03114 Cellulitis of left upper limb: Secondary | ICD-10-CM | POA: Diagnosis not present

## 2022-12-20 LAB — COMPREHENSIVE METABOLIC PANEL
ALT: 23 U/L (ref 0–44)
AST: 20 U/L (ref 15–41)
Albumin: 3.2 g/dL — ABNORMAL LOW (ref 3.5–5.0)
Alkaline Phosphatase: 31 U/L — ABNORMAL LOW (ref 38–126)
Anion gap: 10 (ref 5–15)
BUN: 18 mg/dL (ref 8–23)
CO2: 25 mmol/L (ref 22–32)
Calcium: 8.7 mg/dL — ABNORMAL LOW (ref 8.9–10.3)
Chloride: 99 mmol/L (ref 98–111)
Creatinine, Ser: 1.16 mg/dL (ref 0.61–1.24)
GFR, Estimated: >60 mL/min (ref >60)
Glucose, Bld: 119 mg/dL — ABNORMAL HIGH (ref 70–99)
Potassium: 3.9 mmol/L (ref 3.5–5.1)
Sodium: 134 mmol/L — ABNORMAL LOW (ref 135–145)
Total Bilirubin: 0.6 mg/dL (ref 0.3–1.2)
Total Protein: 6.2 g/dL — ABNORMAL LOW (ref 6.5–8.1)

## 2022-12-20 LAB — CBC WITH DIFFERENTIAL/PLATELET
Abs Immature Granulocytes: 0.04 10*3/uL (ref 0.00–0.07)
Basophils Absolute: 0.1 10*3/uL (ref 0.0–0.1)
Basophils Relative: 1 %
Eosinophils Absolute: 0.1 10*3/uL (ref 0.0–0.5)
Eosinophils Relative: 1 %
HCT: 37.3 % — ABNORMAL LOW (ref 39.0–52.0)
Hemoglobin: 12.5 g/dL — ABNORMAL LOW (ref 13.0–17.0)
Immature Granulocytes: 0 %
Lymphocytes Relative: 27 %
Lymphs Abs: 3 10*3/uL (ref 0.7–4.0)
MCH: 32.6 pg (ref 26.0–34.0)
MCHC: 33.5 g/dL (ref 30.0–36.0)
MCV: 97.1 fL (ref 80.0–100.0)
Monocytes Absolute: 1.4 10*3/uL — ABNORMAL HIGH (ref 0.1–1.0)
Monocytes Relative: 13 %
Neutro Abs: 6.3 10*3/uL (ref 1.7–7.7)
Neutrophils Relative %: 58 %
Platelets: 206 10*3/uL (ref 150–400)
RBC: 3.84 MIL/uL — ABNORMAL LOW (ref 4.22–5.81)
RDW: 12.3 % (ref 11.5–15.5)
WBC: 10.8 10*3/uL — ABNORMAL HIGH (ref 4.0–10.5)
nRBC: 0 % (ref 0.0–0.2)

## 2022-12-20 LAB — PHOSPHORUS: Phosphorus: 3 mg/dL (ref 2.5–4.6)

## 2022-12-20 LAB — RETICULOCYTES
Immature Retic Fract: 13.1 % (ref 2.3–15.9)
RBC.: 3.81 MIL/uL — ABNORMAL LOW (ref 4.22–5.81)
Retic Count, Absolute: 60.2 10*3/uL (ref 19.0–186.0)
Retic Ct Pct: 1.6 % (ref 0.4–3.1)

## 2022-12-20 LAB — HEMOGLOBIN A1C
Hgb A1c MFr Bld: 5.8 % — ABNORMAL HIGH (ref 4.8–5.6)
Mean Plasma Glucose: 119.76 mg/dL

## 2022-12-20 LAB — IRON AND TIBC
Iron: 53 ug/dL (ref 45–182)
Saturation Ratios: 18 % (ref 17.9–39.5)
TIBC: 298 ug/dL (ref 250–450)
UIBC: 245 ug/dL

## 2022-12-20 LAB — AEROBIC/ANAEROBIC CULTURE W GRAM STAIN (SURGICAL/DEEP WOUND): Culture: NO GROWTH

## 2022-12-20 LAB — VITAMIN B12: Vitamin B-12: 265 pg/mL (ref 180–914)

## 2022-12-20 LAB — FOLATE: Folate: 9.4 ng/mL (ref >5.9)

## 2022-12-20 LAB — FERRITIN: Ferritin: 338 ng/mL — ABNORMAL HIGH (ref 24–336)

## 2022-12-20 LAB — MAGNESIUM: Magnesium: 1.9 mg/dL (ref 1.7–2.4)

## 2022-12-20 NOTE — Progress Notes (Signed)
PROGRESS NOTE    Jesse Hodges  WUJ:[id number] DOB: [birth year] DOA: 12/17/2022 PCP: Clayborn Heron, MD    Chief Complaint  Patient presents with   Animal Bite    Brief Narrative:  The patient is a 72 year old Caucasian male with a past medical history significant for but limited to urinary disc disease, hypertension, hyperlipidemia, history of CAD status post PCI as well as other comorbidities presented to ED secondary to cat bite to his left hand with progressively worsening pain and swelling. Patient is right-handed and the cat was vaccinated apparently for rabies and had routine shots. The cat gotten rabies shot last week and the patient has been maintaining the feral cat for almost 2 years. Yesterday afternoon around 4 PM he was helping his wife and got a cat but the cat bit him on the top of the left hand. He cleaned it with water and hydroperoxide and then Polysporin. Overnight and woke him up around 4 AM due to the pain progressively got worse. Patient states that he is able to move his fingers and has been feeling good prior to this. He denied nausea, chills, vomiting, diarrhea. [place] been [place] a lot and had his Tdap given. He does not smoke or drink any alcohol and presented with the above complaint and hand surgery was consulted and they are taking him to the OR given that he is placed on IV antibiotics and the hand has not improved.    Assessment & Plan:   Principal Problem:   Cellulitis of hand, left due to cat bite Active Problems:   Hyponatremia   Leukocytosis   Essential hypertension   CAD (coronary artery disease), native coronary artery   Dyslipidemia   Cat bite of hand, initial encounter  #1 left hand and wrist pain/cellulitis/left dorsal hand abscess/left wrist septic arthritis secondary to cat bite -Status post I&D, excisional debridement of dorsal hand abscess and left radiocarpal and midcarpal arthrotomy for  irrigation of infection per hand surgeon, Dr. Frazier Butt 12/18/2022 -Status post Tdap in the ED. -Noted to be feral cat and received rabies vaccine last week and family has no known cat with no history of erratic behavior. -Surgical cultures pending. -Leukocytosis trending down. -Continue IV Unasyn. -Continue twice daily wound care per hand surgery with warm, diluted IV clinda solution and clean, dry dressings. -Per hand surgeon.  2.  Hyponatremia -Improved with hydration.  3.  Hypertension -Stable. -Continue Avapro.  4.  CAD -Stable. -Continue aspirin, statin.  5.  Hyperlipidemia -Continue statin, fenofibrate.  6.  Hyperglycemia -Hemoglobin A1c 5.8. -Outpatient follow-up.  7.  Obesity -BMI 30.13 kg/m. -Lifestyle modification -Outpatient follow-up with PCP.   DVT prophylaxis: Lovenox Code Status: Full Family Communication: Updated patient and wife at bedside. Disposition: Likely home when clinically improved and cleared by hand surgeon.  Status is: Inpatient Remains inpatient appropriate because: Currently requiring IV antibiotics, severity of illness.   Consultants:  Hand surgeon: Dr. Frazier Butt 12/18/2022  Procedures:  Plan films of the left hand 12/17/2022 Irrigation and debridement of cat bites on dorsum of hand/irrigation and excisional debridement of dorsal hand abscess/left radiocarpal and midcarpal arthrotomy for irrigation of infection: Hand surgeon Dr. Frazier Butt 12/18/2022  Antimicrobials:  Anti-infectives (From admission, onward)    Start     Dose/Rate Route Frequency Ordered Stop   12/17/22 1800  Ampicillin-Sulbactam (UNASYN) 3 g in sodium chloride 0.9 % 100 mL IVPB        3 g  200 mL/hr over 30 Minutes Intravenous Every 6 hours 12/17/22 1248     12/17/22 1300  Ampicillin-Sulbactam (UNASYN) 3 g in sodium chloride 0.9 % 100 mL IVPB  Status:  Discontinued        3 g 200 mL/hr over 30 Minutes Intravenous Every 6 hours 12/17/22 1248 12/17/22 1248   12/17/22  1230  Ampicillin-Sulbactam (UNASYN) 3 g in sodium chloride 0.9 % 100 mL IVPB  Status:  Discontinued        3 g 200 mL/hr over 30 Minutes Intravenous  Once 12/17/22 1218 12/17/22 1248   12/17/22 1015  amoxicillin-clavulanate (AUGMENTIN) 875-125 MG per tablet 1 tablet        1 tablet Oral  Once 12/17/22 1002 12/17/22 1103         Subjective: Sitting up on couch.  Denies any chest pain or shortness of breath.  States he feels swelling has worsened in the left hand postoperatively with some pain.  Objective: Vitals:   12/19/22 1925 12/20/22 0407 12/20/22 0827 12/20/22 1309  BP: 139/85 111/67 123/88 127/80  Pulse: 83 70 71 69  Resp: 15 16 17 20   Temp: 98.3 F (36.8 C) 98.5 F (36.9 C) 98.5 F (36.9 C) 98 F (36.7 C)  TempSrc:  Oral Oral Oral  SpO2: 100% 98% 100% 99%  Weight:      Height:        Intake/Output Summary (Last 24 hours) at 12/20/2022 1900 Last data filed at 12/20/2022 1800 Gross per 24 hour  Intake 900 ml  Output --  Net 900 ml   Filed Weights   12/18/22 1809  Weight: 95.3 kg    Examination:  General exam: Appears calm and comfortable  Respiratory system: Clear to auscultation. Respiratory effort normal. Cardiovascular system: S1 & S2 heard, RRR. No JVD, murmurs, rubs, gallops or clicks. No pedal edema. Gastrointestinal system: Abdomen is nondistended, soft and nontender. No organomegaly or masses felt. Normal bowel sounds heard. Central nervous system: Alert and oriented. No focal neurological deficits. Extremities: Left hand bandage, some slight swelling.  Symmetric 5 x 5 power. Skin: No rashes, lesions or ulcers Psychiatry: Judgement and insight appear normal. Mood & affect appropriate.     Data Reviewed: I have personally reviewed following labs and imaging studies  CBC: Recent Labs  Lab 12/17/22 1044 12/18/22 0130 12/19/22 0921 12/20/22 0229  WBC 12.5* 13.0* 12.5* 10.8*  NEUTROABS 9.8*  --  9.6* 6.3  HGB 13.3 12.7* 12.7* 12.5*  HCT 38.8*  36.5* 37.0* 37.3*  MCV 97.5 96.6 98.1 97.1  PLT 214 196 195 206    Basic Metabolic Panel: Recent Labs  Lab 12/17/22 1044 12/18/22 0130 12/19/22 0921 12/20/22 0229  NA 130* 132* 136 134*  K 4.0 3.8 4.3 3.9  CL 101 100 103 99  CO2 22 22 23 25   GLUCOSE 160* 137* 159* 119*  BUN 22 18 19 18   CREATININE 1.11 1.14 1.21 1.16  CALCIUM 9.2 8.7* 8.9 8.7*  MG  --   --  1.9 1.9  PHOS  --   --  2.3* 3.0    GFR: Estimated Creatinine Clearance: 67.7 mL/min (by C-G formula based on SCr of 1.16 mg/dL).  Liver Function Tests: Recent Labs  Lab 12/19/22 0921 12/20/22 0229  AST 21 20  ALT 25 23  ALKPHOS 33* 31*  BILITOT 1.0 0.6  PROT 6.2* 6.2*  ALBUMIN 3.3* 3.2*    CBG: Recent Labs  Lab 12/17/22 2015  GLUCAP 123*  Recent Results (from the past 240 hour(s))  Aerobic/Anaerobic Culture w Gram Stain (surgical/deep wound)     Status: None (Preliminary result)   Collection Time: 12/18/22  8:31 PM   Specimen: Hand, Left; Wound  Result Value Ref Range Status   Specimen Description HAND  Final   Special Requests NONE  Final   Gram Stain   Final    FEW WBC PRESENT,BOTH PMN AND MONONUCLEAR RARE SQUAMOUS EPITHELIAL CELLS PRESENT NO ORGANISMS SEEN    Culture   Final    NO GROWTH 1 DAY Performed at [facility], [address] [place], [place] [address]    Report Status PENDING  Incomplete         Radiology Studies: No results found.      Scheduled Meds:  aspirin EC  81 mg Oral QHS   atorvastatin  40 mg Oral QHS   chlorhexidine   Topical BID   enoxaparin (LOVENOX) injection  40 mg Subcutaneous Q24H   fenofibrate  160 mg Oral QHS   irbesartan  37.5 mg Oral Daily   Continuous Infusions:  ampicillin-sulbactam (UNASYN) IV Stopped (12/20/22 1736)     LOS: 2 days    Time spent: 35 minutes    Ramiro Harvest, MD [facility]   To contact the attending provider between 7A-7P or the covering provider during after hours 7P-7A, please log into the  web site [email or web address] and access using universal [place] password for that web site. If you do not have the password, please call the hospital operator.  12/20/2022, 7:00 PM

## 2022-12-20 NOTE — Progress Notes (Signed)
Ortho messaged regarding wound and the need to reassess.   Patient reports worsening edema. On assessment LUE is warm to touch, tender and +1 pitting edema.   Patient denies any numbness or tingling.

## 2022-12-20 NOTE — Progress Notes (Signed)
   Subjective:  Resting comfortably at edge of bed.  Pain well controlled.   Objective:   VITALS:   Vitals:   12/19/22 1925 12/20/22 0407 12/20/22 0827 12/20/22 1309  BP: 139/85 111/67 123/88 127/80  Pulse: 83 70 71 69  Resp: 15 16 17 20   Temp: 98.3 F (36.8 C) 98.5 F (36.9 C) 98.5 F (36.9 C) 98 F (36.7 C)  TempSrc:  Oral Oral Oral  SpO2: 100% 98% 100% 99%  Weight:      Height:        Gen: Resting comfortably Pulm: Normal WOB on RA CV: Normal rate LUE: Swelling largely unchanged but much less TTP at dorsal hand and wrist.  Has improved AROM of fingers.  Minimal discomfort w/ A/PROM of wrist in flexion/extension and radial/ulnar deviation which is a significant improvement from before surgery.  Scant serous drainage from wounds.  SILT m/u/r distribution.  Hand warm and well perfused w/ BCR.     Lab Results  Component Value Date   WBC 10.8 (H) 12/20/2022   HGB 12.5 (L) 12/20/2022   HCT 37.3 (L) 12/20/2022   MCV 97.1 12/20/2022   PLT 206 12/20/2022     Assessment/Plan:  71 yo M POD 1 s/p left dorsal hand I&D with wrist arthrotomy for infection following cat bite.  - Wrist pain seems significantly improved from before surgery - Continue BID wound care with warm, diluted Hibiclens solution and clean, dry dressings - Continue IV abx; intra-op cultures w/ NGTD - Leukocytosis improving - Will see again tomorrow, if continuing to improve then can likely be dc'd on PO abx  Marlyne Beards, MD 12/20/2022, 6:49 PM [phone]

## 2022-12-20 NOTE — Progress Notes (Signed)
Mobility Specialist Progress Note   12/20/22 1052  Mobility  Activity Ambulated with assistance in hallway;Dangled on edge of bed  Level of Assistance Modified independent, requires aide device or extra time  Assistive Device None  Distance Ambulated (ft) 550 ft  Range of Motion/Exercises Active;All extremities  Activity Response Tolerated well   Patient received dangling EOB and agreeable to participate. Ambulated mod I with slow steady gait. Returned to room without complaint or incident. Was left standing at the bedside with all needs met, call bell in reach.   [place] Lupe Handley, BS EXP Mobility Specialist Please contact via SecureChat or Rehab office at [phone]

## 2022-12-21 ENCOUNTER — Other Ambulatory Visit (HOSPITAL_COMMUNITY): Payer: Self-pay

## 2022-12-21 DIAGNOSIS — L03114 Cellulitis of left upper limb: Secondary | ICD-10-CM | POA: Diagnosis not present

## 2022-12-21 DIAGNOSIS — W5501XA Bitten by cat, initial encounter: Secondary | ICD-10-CM

## 2022-12-21 DIAGNOSIS — D72829 Elevated white blood cell count, unspecified: Secondary | ICD-10-CM | POA: Diagnosis not present

## 2022-12-21 DIAGNOSIS — E785 Hyperlipidemia, unspecified: Secondary | ICD-10-CM | POA: Diagnosis not present

## 2022-12-21 LAB — BASIC METABOLIC PANEL
Anion gap: 9 (ref 5–15)
BUN: 22 mg/dL (ref 8–23)
CO2: 23 mmol/L (ref 22–32)
Calcium: 8.9 mg/dL (ref 8.9–10.3)
Chloride: 101 mmol/L (ref 98–111)
Creatinine, Ser: 1.15 mg/dL (ref 0.61–1.24)
GFR, Estimated: >60 mL/min (ref >60)
Glucose, Bld: 112 mg/dL — ABNORMAL HIGH (ref 70–99)
Potassium: 4 mmol/L (ref 3.5–5.1)
Sodium: 133 mmol/L — ABNORMAL LOW (ref 135–145)

## 2022-12-21 LAB — CBC WITH DIFFERENTIAL/PLATELET
Abs Immature Granulocytes: 0.04 10*3/uL (ref 0.00–0.07)
Basophils Absolute: 0.1 10*3/uL (ref 0.0–0.1)
Basophils Relative: 1 %
Eosinophils Absolute: 0.1 10*3/uL (ref 0.0–0.5)
Eosinophils Relative: 1 %
HCT: 37.5 % — ABNORMAL LOW (ref 39.0–52.0)
Hemoglobin: 12.7 g/dL — ABNORMAL LOW (ref 13.0–17.0)
Immature Granulocytes: 1 %
Lymphocytes Relative: 27 %
Lymphs Abs: 2.4 10*3/uL (ref 0.7–4.0)
MCH: 32.8 pg (ref 26.0–34.0)
MCHC: 33.9 g/dL (ref 30.0–36.0)
MCV: 96.9 fL (ref 80.0–100.0)
Monocytes Absolute: 1.2 10*3/uL — ABNORMAL HIGH (ref 0.1–1.0)
Monocytes Relative: 13 %
Neutro Abs: 5.1 10*3/uL (ref 1.7–7.7)
Neutrophils Relative %: 57 %
Platelets: 236 10*3/uL (ref 150–400)
RBC: 3.87 MIL/uL — ABNORMAL LOW (ref 4.22–5.81)
RDW: 12.1 % (ref 11.5–15.5)
WBC: 8.9 10*3/uL (ref 4.0–10.5)
nRBC: 0 % (ref 0.0–0.2)

## 2022-12-21 MED ORDER — AMOXICILLIN-POT CLAVULANATE 875-125 MG PO TABS
1.0000 | ORAL_TABLET | Freq: Two times a day (BID) | ORAL | 0 refills | Status: AC
  Filled 2022-12-21: qty 20, 10d supply, fill #0

## 2022-12-21 MED ORDER — CHLORHEXIDINE GLUCONATE 4 % EX SOLN
Freq: Two times a day (BID) | CUTANEOUS | 0 refills | Status: AC
  Filled 2022-12-21: qty 237, 5d supply, fill #0

## 2022-12-21 MED ORDER — OXYCODONE HCL 5 MG PO TABS
5.0000 mg | ORAL_TABLET | ORAL | 0 refills | Status: DC | PRN
  Filled 2022-12-21: qty 10, 3d supply, fill #0

## 2022-12-21 NOTE — Progress Notes (Signed)
   Subjective:  No acute events overnight.  Notes that his pain and swelling has improved tremendously.    Objective:   VITALS:   Vitals:   12/20/22 1309 12/20/22 1956 12/21/22 0405 12/21/22 0729  BP: 127/80 (!) 129/94 113/79 134/84  Pulse: 69 67 (!) 59 65  Resp: 20 18 16    Temp: 98 F (36.7 C) 98.4 F (36.9 C) 97.9 F (36.6 C) 97.7 F (36.5 C)  TempSrc: Oral Oral Oral Oral  SpO2: 99% 100% 98% 99%  Weight:      Height:        Gen: NAD, resting comfortably on edge of bed eating lunch Pulm: Normal WOB on RA CV: Normal rate LUE: Swelling of dorsal hand continues to improve, incisions are clean with scant serous drainage but on purulence, erythema has improved, no pain w/ PROM of wrist, improving finger ROM but limited by "tightness" around MCPJs, SILT m/u/r distribution, hand warm and well perfused w/ BCR    Lab Results  Component Value Date   WBC 8.9 12/21/2022   HGB 12.7 (L) 12/21/2022   HCT 37.5 (L) 12/21/2022   MCV 96.9 12/21/2022   PLT 236 12/21/2022     Assessment/Plan:  72 yo M POD 3 s/p left dorsal hand I&D with wrist arthrotomy for infection following cat bite.   - Swelling, pain, and erythema have significantly improved - Leukocytosis resolved - Discussed with patient that he should continue wound care with warm, soapy water and clean, dry dressings at home - No plans for additional surgery; patient safe for discharge from hand surgery perspective - I can see patient in the office next week unless he develops worsening pain, erythema, or swelling in which case I can see him sooner  Marlyne Beards, MD 12/21/2022, 12:06 PM [phone]

## 2022-12-21 NOTE — Discharge Summary (Signed)
Physician Discharge Summary  Jesse Hodges:[id number] DOB: 07/07/1950 DOA: 12/17/2022  PCP: Clayborn Heron, MD  Admit date: 12/17/2022 Discharge date: 12/21/2022  Time spent: 55 minutes  Recommendations for Outpatient Follow-up:  Follow-up with Dr. Frazier Butt, hand surgery/orthopedics in 1 week. Follow-up with Rankins, Fanny Dance, MD in 2 weeks.   Discharge Diagnoses:  Principal Problem:   Cellulitis of hand, left due to cat bite Active Problems:   Hyponatremia   Leukocytosis   Essential hypertension   CAD (coronary artery disease), native coronary artery   Dyslipidemia   Cat bite of hand, initial encounter   Discharge Condition: Stable and improved.  Diet recommendation: Heart healthy  Filed Weights   12/18/22 1809  Weight: 95.3 kg    History of present illness:  HPI per Dr. Annitta Jersey Jesse Hodges is a 72 y.o. male with medical history significant of DDD, HLD, HTN, CAD s/p PCI who presented to ED for a cat bite to his left hand that has had progressive pain and swelling. He is right handed. Cat is vaccinated for rabies/routine shots. Got his rabies shot last week. Has been maintaining the cat x 2 years. Yesterday afternoon around 4pm he was helping his wife get a cat and he got bit on the top of his left hand. He cleaned it with water and H202 and then polysporin. Overnight it woke him up around 4Am due to pain and has progressively gotten worse. He states he has pain moving his fingers.      He has been feeling good. Denies any fever/chills, vision changes/headaches, chest pain or palpitations, shortness of breath or cough, abdominal pain, N/V/D, dysuria or leg swelling. He has been eating and drinking fine. [place] lot. He thinks he had his tdap at his PCP, but given today.      He does not smoke or drink alcohol.    ER Course:  vitals: afebrile, bp: 158/78, HR: 79, RR: 16, oxygen: 100%RA Pertinent labs: wbc:  12.5, sodium: 130,  Hand xray: Soft tissue swelling without radiopaque foreign body. Concerning for cellulitis. Wrist chondrocalcinosis.  Base of thumb CMC joint arthritis. In ED: hand surgery consulted Started on unasyn and given tdap. [facility] asked to admit.   Hospital Course:  #1 left hand and wrist pain/cellulitis/left dorsal hand abscess/left wrist septic arthritis secondary to cat bite -Status post I&D, excisional debridement of dorsal hand abscess and left radiocarpal and midcarpal arthrotomy for irrigation of infection per hand surgeon, Dr. Frazier Butt 12/18/2022 -Status post Tdap in the ED. -Noted to be feral cat and received rabies vaccine last week and family has no known cat with no history of erratic behavior. -Surgical cultures obtained with no growth to date x 2 days by day of discharge.   -Patient placed on IV Unasyn during the hospitalization.   -Leukocytosis trended down and had resolved by day of discharge.   -Wound care orders were placed by hand surgery during the hospitalization.  -Patient improved clinically and will be discharged home on 10 more days of Augmentin to complete a 14-day course of antibiotic treatment.   -Outpatient follow-up with Dr. Frazier Butt, hand surgeon/orthopedics in 1 week.  -Patient will be discharged in stable and improved condition.    2.  Hyponatremia -Improved with hydration.   3.  Hypertension -Stable. -Patient placed on Avapro during the hospitalization.   4.  CAD -Stable. -Patient maintained on home regimen aspirin, statin.   5.  Hyperlipidemia -Patient maintained on home regimen statin, fenofibrate.   6.  Hyperglycemia -Hemoglobin A1c 5.8. -Outpatient follow-up.   7.  Obesity -BMI 30.13 kg/m. -Lifestyle modification -Outpatient follow-up with PCP.  Procedures: Plan films of the left hand 12/17/2022 Irrigation and debridement of cat bites on dorsum of hand/irrigation and excisional debridement of dorsal hand abscess/left  radiocarpal and midcarpal arthrotomy for irrigation of infection: Hand surgeon Dr. Frazier Butt 12/18/2022  Consultations: Hand surgeon/orthopedics: Dr. Frazier Butt 12/18/2022  Discharge Exam: Vitals:   12/21/22 0405 12/21/22 0729  BP: 113/79 134/84  Pulse: (!) 59 65  Resp: 16   Temp: 97.9 F (36.6 C) 97.7 F (36.5 C)  SpO2: 98% 99%    General: NAD Cardiovascular: RRR no murmurs rubs or gallops.  No JVD.  No lower extremity edema. Respiratory: Clear to auscultation bilaterally.  No wheezes, no crackles, no rhonchi.  Fair air movement.  Speaking in full sentences.  Discharge Instructions   Discharge Instructions     Diet - low sodium heart healthy   Complete by: As directed    Discharge wound care:   Complete by: As directed    Wound care  2 times daily      Comments: Soak operative hand in warm, diluted Hibiclens solution for 10 minutes.  Fill basin with warm, bath temperature water.  Add two 15 mL packet to water or three heavy squirts from bottle (~ 2 tablespoons).  Soak hand for 10 minutes and encourage patient to move fingers around in water.  Pat dry and apply a clean, dry dressing.   Increase activity slowly   Complete by: As directed       Allergies as of 12/21/2022   No Known Allergies      Medication List     TAKE these medications    amoxicillin-clavulanate 875-125 MG tablet Commonly known as: AUGMENTIN Take 1 tablet by mouth 2 (two) times daily for 10 days.   aspirin EC 81 MG tablet Take 81 mg by mouth at bedtime.   atorvastatin 40 MG tablet Commonly known as: LIPITOR Take 1 tablet (40 mg total) by mouth daily. What changed: when to take this   chlorhexidine 4 % external liquid Commonly known as: HIBICLENS Apply topically 2 (two) times daily for 5 days. Soak operative hand in warm, diluted Hibiclens solution for 10 minutes.  Fill basin with warm, bath temperature water.  Add two 15 mL packet to water or three heavy squirts from bottle (~ 2 tablespoons).   Soak hand for 10 minutes and encourage patient to move fingers around in water.  Pat dry and apply a clean, dry dressing.   fenofibrate 160 MG tablet TAKE 1 TABLET BY MOUTH EVERY DAY What changed: when to take this   ibuprofen 200 MG tablet Commonly known as: ADVIL Take 200 mg by mouth every 6 (six) hours as needed for mild pain or moderate pain.   olmesartan 5 MG tablet Commonly known as: BENICAR TAKE 1 TABLET BY MOUTH EVERY DAY What changed:  how much to take when to take this   oxyCODONE 5 MG immediate release tablet Commonly known as: Oxy IR/ROXICODONE Take 1 tablet (5 mg total) by mouth every 4 (four) hours as needed for moderate pain.               Discharge Care Instructions  (From admission, onward)           Start     Ordered   12/21/22 0000  Discharge wound care:  Comments: Wound care  2 times daily      Comments: Soak operative hand in warm, diluted Hibiclens solution for 10 minutes.  Fill basin with warm, bath temperature water.  Add two 15 mL packet to water or three heavy squirts from bottle (~ 2 tablespoons).  Soak hand for 10 minutes and encourage patient to move fingers around in water.  Pat dry and apply a clean, dry dressing.   12/21/22 1517           No Known Allergies  Follow-up Information     Marlyne Beards, MD. Schedule an appointment as soon as possible for a visit in 1 week(s).   Specialty: Orthopedic Surgery Contact information: [address] [place] [address] [phone]         Clayborn Heron, MD. Schedule an appointment as soon as possible for a visit in 2 week(s).   Specialty: Family Medicine Contact information: [address] [place] [address] [phone]                  The results of significant diagnostics from this hospitalization (including imaging, microbiology, ancillary and laboratory) are listed below for reference.    Significant Diagnostic Studies: DG  Hand Complete Left  Result Date: 12/17/2022 CLINICAL DATA:  Bit by a cat EXAM: LEFT HAND - COMPLETE 3+ VIEW COMPARISON:  None Available. FINDINGS: Soft tissue swelling over the posterior aspect of the hand. No fracture, malalignment or radiopaque foreign body. 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 IMPRESSION: Soft tissue swelling without radiopaque foreign body. Concerning for cellulitis. 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 Electronically Signed   By: Malachy Moan M.D.   On: 12/17/2022 10:16    Microbiology: Recent Results (from the past 240 hour(s))  Aerobic/Anaerobic Culture w Gram Stain (surgical/deep wound)     Status: None (Preliminary result)   Collection Time: 12/18/22  8:31 PM   Specimen: Hand, Left; Wound  Result Value Ref Range Status   Specimen Description HAND  Final   Special Requests NONE  Final   Gram Stain   Final    FEW WBC PRESENT,BOTH PMN AND MONONUCLEAR RARE SQUAMOUS EPITHELIAL CELLS PRESENT NO ORGANISMS SEEN    Culture   Final    NO GROWTH 2 DAYS NO ANAEROBES ISOLATED; CULTURE IN PROGRESS FOR 5 DAYS Performed at [facility], [address], [place], [place] [address]    Report Status PENDING  Incomplete     Labs: Basic Metabolic Panel: Recent Labs  Lab 12/17/22 1044 12/18/22 0130 12/19/22 0921 12/20/22 0229 12/21/22 0108  NA 130* 132* 136 134* 133*  K 4.0 3.8 4.3 3.9 4.0  CL 101 100 103 99 101  CO2 22 22 23 25 23   GLUCOSE 160* 137* 159* 119* 112*  BUN 22 18 19 18 22   CREATININE 1.11 1.14 1.21 1.16 1.15  CALCIUM 9.2 8.7* 8.9 8.7* 8.9  MG  --   --  1.9 1.9  --   PHOS  --   --  2.3* 3.0  --    Liver Function Tests: Recent Labs  Lab 12/19/22 0921 12/20/22 0229  AST 21 20  ALT 25 23  ALKPHOS 33* 31*  BILITOT 1.0 0.6  PROT 6.2* 6.2*  ALBUMIN 3.3* 3.2*   No results for input(s): "LIPASE", "AMYLASE" in the last 168 hours. No results for input(s): "AMMONIA" in the last 168  hours. CBC: Recent Labs  Lab 12/17/22 1044 12/18/22  0130 12/19/22 0921 12/20/22 0229 12/21/22 0108  WBC 12.5* 13.0* 12.5* 10.8* 8.9  NEUTROABS 9.8*  --  9.6* 6.3 5.1  HGB 13.3 12.7* 12.7* 12.5* 12.7*  HCT 38.8* 36.5* 37.0* 37.3* 37.5*  MCV 97.5 96.6 98.1 97.1 96.9  PLT 214 196 195 206 236   Cardiac Enzymes: No results for input(s): "CKTOTAL", "CKMB", "CKMBINDEX", "TROPONINI" in the last 168 hours. BNP: BNP (last 3 results) No results for input(s): "BNP" in the last 8760 hours.  ProBNP (last 3 results) No results for input(s): "PROBNP" in the last 8760 hours.  CBG: Recent Labs  Lab 12/17/22 2015  GLUCAP 123*       Signed:  Ramiro Harvest MD.  [facility] 12/21/2022, 3:18 PM

## 2022-12-21 NOTE — Care Management Important Message (Signed)
Important Message  Patient Details  Name: AERO STANISLAW MRN: [id number] Date of Birth: 21-Jan-1951   Medicare Important Message Given:  Yes     Sherilyn Banker 12/21/2022, 2:36 PM

## 2022-12-21 NOTE — Progress Notes (Signed)
Mobility Specialist Progress Note:    12/21/22 1250  Mobility  Activity Ambulated independently in hallway  Level of Assistance Modified independent, requires aide device or extra time  Assistive Device None  Distance Ambulated (ft) 550 ft  Activity Response Tolerated well  Mobility Referral Yes  $Mobility charge 1 Mobility  Mobility Specialist Start Time (ACUTE ONLY) 1238  Mobility Specialist Stop Time (ACUTE ONLY) 1248  Mobility Specialist Time Calculation (min) (ACUTE ONLY) 10 min   Received pt in chair having no complaints and agreeable to mobility. Pt was asymptomatic throughout ambulation and returned to room w/o fault. Left in chair w/ call bell in reach and all needs met.   Thompson Grayer Mobility Specialist  Please contact vis Secure Chat or  Rehab Office [phone]

## 2022-12-21 NOTE — Plan of Care (Signed)

## 2022-12-21 NOTE — Progress Notes (Signed)
Patient discharged, discharge instructions given and explained. Patient has no additional questions and or concerns at this time. Patient IV removed per protocol. TOC called for medications and states will be delivered within 15/20min. Awaiting TOC and patient will be discharged in wheelchair for safety.

## 2022-12-23 LAB — AEROBIC/ANAEROBIC CULTURE W GRAM STAIN (SURGICAL/DEEP WOUND)

## 2022-12-27 ENCOUNTER — Other Ambulatory Visit (HOSPITAL_COMMUNITY): Payer: Self-pay

## 2023-04-18 ENCOUNTER — Ambulatory Visit (HOSPITAL_BASED_OUTPATIENT_CLINIC_OR_DEPARTMENT_OTHER): Payer: Federal, State, Local not specified - PPO | Admitting: Cardiology

## 2023-04-18 ENCOUNTER — Encounter (HOSPITAL_BASED_OUTPATIENT_CLINIC_OR_DEPARTMENT_OTHER): Payer: Self-pay | Admitting: Cardiology

## 2023-04-18 VITALS — BP 102/70 | HR 76 | Ht 72.0 in | Wt 212.0 lb

## 2023-04-18 DIAGNOSIS — I10 Essential (primary) hypertension: Secondary | ICD-10-CM | POA: Diagnosis not present

## 2023-04-18 DIAGNOSIS — I2510 Atherosclerotic heart disease of native coronary artery without angina pectoris: Secondary | ICD-10-CM

## 2023-04-18 DIAGNOSIS — Z7189 Other specified counseling: Secondary | ICD-10-CM

## 2023-04-18 DIAGNOSIS — Z79899 Other long term (current) drug therapy: Secondary | ICD-10-CM | POA: Diagnosis not present

## 2023-04-18 DIAGNOSIS — E782 Mixed hyperlipidemia: Secondary | ICD-10-CM

## 2023-04-18 DIAGNOSIS — Z955 Presence of coronary angioplasty implant and graft: Secondary | ICD-10-CM

## 2023-04-18 NOTE — Progress Notes (Signed)
Cardiology Office Note:  .   Date:  04/18/2023  ID:  Jesse Hodges, DOB July 03, 1951, MRN [id number] PCP: Clayborn Heron, MD  [facility] Providers Cardiologist:  Jodelle Red, MD {  History of Present Illness: .   Jesse Hodges is a 72 y.o. male with a hx of CAD s/p PCI, hypertension, hyperlipidemia who is seen for follow up today. He was a new patient to me on 05/17/18.   Cardiac history per Dr. Donnie Aho records: Cath 07/2014 s/p promus stent to LAD; normal LM, 90% stenosis pLAD, nl LCx, nl RCA, LVEF 60%  Today: No new cardiac concerns. Was in the hospital for 5 days due to a cat bite, required I&D. Reviewed medications today. Stays active, cooks regularly. Doesn't feel like his triglycerides have been high, but has not had it checked recently. Has had TG >1000 before and felt poorly at that time.   ROS: Denies chest pain, shortness of breath at rest or with normal exertion. No PND, orthopnea, LE edema or unexpected weight gain. No syncope or palpitations. ROS otherwise negative except as noted.   Studies Reviewed: Marland Kitchen    EKG:  EKG Interpretation Date/Time:  Wednesday April 18 2023 14:13:14 EDT Ventricular Rate:  76 PR Interval:  134 QRS Duration:  78 QT Interval:  382 QTC Calculation: 429 R Axis:   10  Text Interpretation: Normal sinus rhythm Normal ECG When compared with ECG of 14-Feb-2018 05:43, No significant change was found Confirmed by Jodelle Red [phone]) on 04/18/2023 5:23:53 PM    Physical Exam:   VS:  BP 102/70 (BP Location: Left Arm, Patient Position: Sitting, Cuff Size: Normal)   Pulse 76   Ht 6' (1.829 m)   Wt 212 lb (96.2 kg)   SpO2 96%   BMI 28.75 kg/m    Wt Readings from Last 3 Encounters:  04/18/23 212 lb (96.2 kg)  12/18/22 210 lb (95.3 kg)  03/16/22 226 lb 6.4 oz (102.7 kg)    GEN: Well nourished, well developed in no acute distress HEENT: Normal, moist mucous membranes NECK: No JVD CARDIAC: regular rhythm, normal S1  and S2, no rubs or gallops. No murmur. VASCULAR: Radial and DP pulses 2+ bilaterally. No carotid bruits RESPIRATORY:  Clear to auscultation without rales, wheezing or rhonchi  ABDOMEN: Soft, non-tender, non-distended MUSCULOSKELETAL:  Ambulates independently SKIN: Warm and dry, no edema NEUROLOGIC:  Alert and oriented x 3. No focal neuro deficits noted. PSYCHIATRIC:  Normal affect    ASSESSMENT AND PLAN: .    CAD s/p prior LAD PCI in 2016:  -no angina -Aspirin 81 mg -statin: atorvastatin 40 mg daily, tolerating.  -LDL goal <70, last LDL 78 per [facility] in 2021. 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 No history of pancreatitis.  -attempted vascepa with good drop in TG (though did not get below 150). However, the cost of this varied for him.  -changed to fibrate. No side effects noted (given that he is on high intensity statin) -last TG 235, not at goal, recheck ordered  CV risk counseling and prevention -recommend heart healthy/Mediterranean diet, with whole grains, fruits, vegetable, fish, lean meats, nuts, and olive oil.  Limit salt. -recommend moderate walking, 3-5 times/week for 30-50 minutes each session. Aim for at least 150 minutes.week. Goal should be pace of 3 miles/hours, or walking 1.5 miles in 30 minutes -recommend avoidance of tobacco products. Avoid excess alcohol.  Dispo: 1 year or sooner as needed  Signed, Jodelle Red, MD   Jodelle Red, MD, PhD, [facility]  [facility]  [place]  [facility] & V[facility] at [facility] [address] [place], [place] [address] [phone]

## 2023-04-18 NOTE — Patient Instructions (Signed)
Medication Instructions:  NONE *If you need a refill on your cardiac medications before your next appointment, please call your pharmacy*   Lab Work: FASTING LIPID  If you have labs (blood work) drawn today and your tests are completely normal, you will receive your results only by: MyChart Message (if you have MyChart) OR A paper copy in the mail If you have any lab test that is abnormal or we need to change your treatment, we will call you to review the results.   Testing/Procedures: NONE   Follow-Up: At [facility], you and your health needs are our priority.  As part of our continuing mission to provide you with exceptional heart care, we have created designated Provider Care Teams.  These Care Teams include your primary Cardiologist (physician) and Advanced Practice Providers (APPs -  Physician Assistants and Nurse Practitioners) who all work together to provide you with the care you need, when you need it.  We recommend signing up for the patient portal called "MyChart".  Sign up information is provided on this After Visit Summary.  MyChart is used to connect with patients for Virtual Visits (Telemedicine).  Patients are able to view lab/test results, encounter notes, upcoming appointments, etc.  Non-urgent messages can be sent to your provider as well.   To learn more about what you can do with MyChart, go to [email or web address].    Your next appointment:   1 year(s)  Provider:   Jodelle Red, MD    Other Instructions NONE

## 2023-04-23 ENCOUNTER — Other Ambulatory Visit: Payer: Self-pay | Admitting: Cardiology

## 2023-04-24 ENCOUNTER — Other Ambulatory Visit: Payer: Self-pay | Admitting: Cardiology

## 2023-08-01 ENCOUNTER — Telehealth: Payer: Self-pay | Admitting: Cardiology

## 2023-08-01 NOTE — Telephone Encounter (Signed)
   Pt c/o of Chest Pain: STAT if active CP, including tightness, pressure, jaw pain, radiating pain to shoulder/upper arm/back, CP unrelieved by Nitro. Symptoms reported of SOB, nausea, vomiting, sweating.  1. Are you having CP right now? No     2. Are you experiencing any other symptoms (ex. SOB, nausea, vomiting, sweating)?  No    3. Is your CP continuous or coming and going? Coming and going    4. Have you taken Nitroglycerin? No    5. How long have you been experiencing CP?  Just started today    6. If NO CP at time of call then end call with telling Pt to call back or call 911 if Chest pain returns prior to return call from triage team. Pt insisted on making an appt, he did not want to go to ED. Scheduled for 08/03/23 w/ Dr. Cristal Deer.

## 2023-08-01 NOTE — Telephone Encounter (Signed)
Spoke with pt, he reports the pain started Saturday evening with arm pain that lasted about all night and kept him awake. He did take an advil and the pain went away. Then he developed a pain across the top of his chest that goes over to the right. He made the appointment because he feels this is similar to the pain he had prior to his last stent. The discomfort comes and goes with rest or activity. He reports it can last up to 4 hours. Patient voiced understanding of when to go to the ER and not wait for the appointment. He will see dr Cristal Deer 08/03/23.

## 2023-08-03 ENCOUNTER — Encounter (HOSPITAL_BASED_OUTPATIENT_CLINIC_OR_DEPARTMENT_OTHER): Payer: Self-pay | Admitting: Cardiology

## 2023-08-03 ENCOUNTER — Other Ambulatory Visit (HOSPITAL_BASED_OUTPATIENT_CLINIC_OR_DEPARTMENT_OTHER): Payer: Self-pay | Admitting: Cardiology

## 2023-08-03 ENCOUNTER — Ambulatory Visit (HOSPITAL_BASED_OUTPATIENT_CLINIC_OR_DEPARTMENT_OTHER): Payer: Federal, State, Local not specified - PPO | Admitting: Cardiology

## 2023-08-03 VITALS — BP 114/78 | HR 68 | Ht 72.0 in | Wt 213.4 lb

## 2023-08-03 DIAGNOSIS — E782 Mixed hyperlipidemia: Secondary | ICD-10-CM | POA: Diagnosis not present

## 2023-08-03 DIAGNOSIS — Z955 Presence of coronary angioplasty implant and graft: Secondary | ICD-10-CM

## 2023-08-03 DIAGNOSIS — Z7189 Other specified counseling: Secondary | ICD-10-CM

## 2023-08-03 DIAGNOSIS — I2510 Atherosclerotic heart disease of native coronary artery without angina pectoris: Secondary | ICD-10-CM

## 2023-08-03 DIAGNOSIS — I10 Essential (primary) hypertension: Secondary | ICD-10-CM

## 2023-08-03 DIAGNOSIS — Z01812 Encounter for preprocedural laboratory examination: Secondary | ICD-10-CM

## 2023-08-03 DIAGNOSIS — R072 Precordial pain: Secondary | ICD-10-CM

## 2023-08-03 LAB — TROPONIN T: Troponin T (Highly Sensitive): 7 ng/L (ref 0–22)

## 2023-08-03 LAB — D-DIMER, QUANTITATIVE: D-DIMER: 0.2 mg{FEU}/L (ref 0.00–0.49)

## 2023-08-03 MED ORDER — NITROGLYCERIN 0.4 MG SL SUBL: 0.4000 mg | SUBLINGUAL_TABLET | SUBLINGUAL | 3 refills | Status: DC | PRN

## 2023-08-03 NOTE — Progress Notes (Signed)
Cardiology Office Note:  .   Date:  08/03/2023  ID:  Jesse Hodges, DOB [birth year], MRN [id number] PCP: Jesse Heron, MD   HeartCare Providers Cardiologist:  Jodelle Red, MD {  History of Present Illness: .   Jesse Hodges is a 73 y.o. male with a hx of CAD s/p PCI, hypertension, hyperlipidemia who is seen for follow up today. He was a new patient to me on 05/17/18 former patient of Dr. Donnie Aho at the request of Jesse Hodges, Jesse Dance, MD for the evaluation and management of CAD.   Cardiac history per Dr. Donnie Aho records: Cath 07/2014 s/p promus stent to LAD; normal LM, 90% stenosis pLAD, nl LCx, nl RCA, LVEF 60%  Today: Patient called 08/01/23 with arm pain and chest pain, noted to be at rest and with exertion. Noted it was similar to pain he had before his heart attack, though it was left sided before and now right sided. Given his history, recommended to go to ER for evaluation.  Pain started Friday night, triceps area of R arm, felt like someone was gripping his arm tightly. Moved up his area, across his right pectoral area. Pain would come and go, could be gone for hours, but then return and last for 6-7 hours when he was trying to sleep. The first time it happened, he took advil and it went away.  No injury, no new activity/exertion. Not tender to palpation. No shortness of breath, diaphoresis, nausea, or other associated symptoms. It does not limit him. Has been intermittent, frequency is staying the same but intensity gradually decreasing.  Arm has not been cold/getting discolored. Might have had a little bruising but this resolved, pain persists.  Had cheerios and coffee a few hours ago.  ROS: Denies shortness of breath at rest or with normal exertion. No PND, orthopnea, LE edema or unexpected weight gain. No syncope or palpitations. ROS otherwise negative except as noted.   Studies Reviewed: Marland Kitchen    EKG:  EKG Interpretation Date/Time:  Friday August 03 2023  08:17:29 EST Ventricular Rate:  68 PR Interval:  144 QRS Duration:  82 QT Interval:  398 QTC Calculation: 423 R Axis:   20  Text Interpretation: Normal sinus rhythm Normal ECG When compared with ECG of 18-Apr-2023 14:13, No significant change was found Confirmed by Jodelle Red [phone]) on 08/03/2023 8:27:35 AM    Physical Exam:   VS:  BP 114/78 (BP Location: Left Arm, Patient Position: Sitting)   Pulse 68   Ht 6' (1.829 m)   Wt 213 lb 6.4 oz (96.8 kg)   SpO2 99%   BMI 28.94 kg/m    Wt Readings from Last 3 Encounters:  08/03/23 213 lb 6.4 oz (96.8 kg)  04/18/23 212 lb (96.2 kg)  12/18/22 210 lb (95.3 kg)    GEN: Well nourished, well developed in no acute distress HEENT: Normal, moist mucous membranes NECK: No JVD CARDIAC: regular rhythm, normal S1 and S2, no rubs or gallops. No murmur. VASCULAR: Radial and DP pulses 2+ bilaterally. No carotid bruits RESPIRATORY:  Clear to auscultation without rales, wheezing or rhonchi  ABDOMEN: Soft, non-tender, non-distended MUSCULOSKELETAL:  Ambulates independently SKIN: Warm and dry, no edema NEUROLOGIC:  Alert and oriented x 3. No focal neuro deficits noted. PSYCHIATRIC:  Normal affect    ASSESSMENT AND PLAN: .    Chest pain: -pain at rest, right sided but similar to his pain from before his stent, though before his pain was only exertional, and this  is at rest and with activity -we discussed unstable angina vs. Stable, recommendation for ER evaluation -he declines ER evaluation -we discussed options. After shared decision making, will get STAT d dimer and hsTn. If these are both ok, will proceed with cardiac PET. If hsTn elevated, he agreed to present to ER for admission/evaluation. If hsTn normal but d dimer elevated, he agreed for CTPE -check bmet, cbc in anticipation of procedure  Updated: hsTn normal at 7, d dimer normal at 0.2. I called him to give him the results. We will proceed with cardiac PET. Reviewed that he needs  to present to ER for any worsening of symptoms  Informed Consent   Shared Decision Making/Informed Consent The risks [chest pain, shortness of breath, cardiac arrhythmias, dizziness, blood pressure fluctuations, myocardial infarction, stroke/transient ischemic attack, nausea, vomiting, allergic reaction, radiation exposure, metallic taste sensation and life-threatening complications (estimated to be 1 in 10,000)], benefits (risk stratification, diagnosing coronary artery disease, treatment guidance) and alternatives of a cardiac PET stress test were discussed in detail with Jesse Hodges and he agrees to proceed.      CAD s/p prior LAD PCI in 2016:  -Aspirin 81 mg -statin: atorvastatin 40 mg daily, tolerating.  -LDL goal <70, labs ordered today. 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 No history of pancreatitis.  -attempted vascepa with good drop in TG (though did not get below 150). However, the cost of this varied for him.  -changed to fibrate. No side effects noted (given that he is on high intensity statin) -check lipids as above. He did have cheerios this AM so not completely fasting  CV risk counseling and prevention -recommend heart healthy/Mediterranean diet, with whole grains, fruits, vegetable, fish, lean meats, nuts, and olive oil. Limit salt. -recommend moderate walking, 3-5 times/week for 30-50 minutes each session. Aim for at least 150 minutes.week. Goal should be pace of 3 miles/hours, or walking 1.5 miles in 30 minutes -recommend avoidance of tobacco products. Avoid excess alcohol.  Dispo: as above, given that STAT labs normal, will  proceed with cardiac PET. If PET is stable, will follow up in 2 mos. If abnormal, will bring back to discuss next steps.  Total time of encounter: I spent 50 minutes dedicated to the care of this patient on the date of this encounter to include pre-visit review of records, face-to-face time with the patient discussing conditions above, and clinical documentation with the electronic health record. We specifically spent time today discussing chest pain, recommendations for evaluation/options, shared decision making, plans for labs/testing.   Signed, Jodelle Red, MD   Jodelle Red, MD, PhD, [facility]  [facility]  [place]  [facility] at [facility] at [facility] [address], [address] [place], [place] [address] [phone]

## 2023-08-03 NOTE — Patient Instructions (Addendum)
Medication Instructions:  No medication changes were made during today's visit  *If you need a refill on your cardiac medications before your next appointment, please call your pharmacy*   Lab Work: Stat labs performed today If you have labs (blood work) drawn today and your tests are completely normal, you will receive your results only by: MyChart Message (if you have MyChart) OR A paper copy in the mail If you have any lab test that is abnormal or we need to change your treatment, we will call you to review the results.   Testing/Procedures: Your physician recommends a cardiac PET.   Follow-Up: At [facility], you and your health needs are our priority.  As part of our continuing mission to provide you with exceptional heart care, we have created designated Provider Care Teams.  These Care Teams include your primary Cardiologist (physician) and Advanced Practice Providers (APPs -  Physician Assistants and Nurse Practitioners) who all work together to provide you with the care you need, when you need it.  We recommend signing up for the patient portal called "MyChart".  Sign up information is provided on this After Visit Summary.  MyChart is used to connect with patients for Virtual Visits (Telemedicine).  Patients are able to view lab/test results, encounter notes, upcoming appointments, etc.  Non-urgent messages can be sent to your provider as well.   To learn more about what you can do with MyChart, go to [email or web address].    Your next appointment:   2 months  Provider:   Jodelle Red, MD   Other Instructions Thank you for choosing [facility]!         Please report to Radiology at the [facility] Main Entrance 30 minutes early for your test.  [address] [place], [place] [address]                         OR   Please report to Radiology at [facility] Main Entrance, medical mall, 30 mins prior to  your test.  [address]  [place], [place]  How to Prepare for Your Cardiac PET/CT Stress Test:  Nothing to eat or drink, except water, 3 hours prior to arrival time.  NO caffeine/decaffeinated products, or chocolate 12 hours prior to arrival. (Please note decaffeinated beverages (teas/coffees) still contain caffeine).  If you have caffeine within 12 hours prior, the test will need to be rescheduled.  Medication instructions: Do not take erectile dysfunction medications for 72 hours prior to test (sildenafil, tadalafil) Do not take nitrates (isosorbide mononitrate, Ranexa) the day before or day of test Do not take tamsulosin the day before or morning of test Hold theophylline containing medications for 12 hours. Hold Dipyridamole 48 hours prior to the test.  Diabetic Preparation: If able to eat breakfast prior to 3 hour fasting, you may take all medications, including your insulin. Do not worry if you miss your breakfast dose of insulin - start at your next meal. If you do not eat prior to 3 hour fast-Hold all diabetes (oral and insulin) medications. Patients who wear a continuous glucose monitor MUST remove the device prior to scanning.  You may take your remaining medications with water.  NO perfume, cologne or lotion on chest or abdomen area. FEMALES - Please avoid wearing dresses to this appointment.  Total time is 1 to 2 hours; you may want to bring reading material for the waiting time.  IF YOU THINK YOU MAY BE PREGNANT, OR ARE NURSING PLEASE INFORM THE TECHNOLOGIST.  In preparation for your appointment, medication and supplies will be purchased.  Appointment availability is limited, so if you need to cancel or reschedule, please call the Radiology Department Scheduler at [phone] 24 hours in advance to avoid a cancellation fee of $100.00  What to Expect When you Arrive:  Once you arrive and check in for your appointment, you will be taken to a preparation room  within the Radiology Department.  A technologist or Nurse will obtain your medical history, verify that you are correctly prepped for the exam, and explain the procedure.  Afterwards, an IV will be started in your arm and electrodes will be placed on your skin for EKG monitoring during the stress portion of the exam. Then you will be escorted to the PET/CT scanner.  There, staff will get you positioned on the scanner and obtain a blood pressure and EKG.  During the exam, you will continue to be connected to the EKG and blood pressure machines.  A small, safe amount of a radioactive tracer will be injected in your IV to obtain a series of pictures of your heart along with an injection of a stress agent.    After your Exam:  It is recommended that you eat a meal and drink a caffeinated beverage to counter act any effects of the stress agent.  Drink plenty of fluids for the remainder of the day and urinate frequently for the first couple of hours after the exam.  Your doctor will inform you of your test results within 7-10 business days.  For more information and frequently asked questions, please visit our website: [email or web address]  For questions about your test or how to prepare for your test, please call: Cardiac Imaging Nurse Navigators Office: [phone]

## 2023-08-06 LAB — BASIC METABOLIC PANEL
BUN/Creatinine Ratio: 18 (ref 10–24)
BUN: 20 mg/dL (ref 8–27)
CO2: 22 mmol/L (ref 20–29)
Calcium: 10.3 mg/dL — ABNORMAL HIGH (ref 8.6–10.2)
Chloride: 101 mmol/L (ref 96–106)
Creatinine, Ser: 1.09 mg/dL (ref 0.76–1.27)
Glucose: 93 mg/dL (ref 70–99)
Potassium: 5 mmol/L (ref 3.5–5.2)
Sodium: 138 mmol/L (ref 134–144)
eGFR: 73 mL/min/{1.73_m2} (ref >59)

## 2023-08-06 LAB — LIPID PANEL
Chol/HDL Ratio: 4.5 {ratio} (ref 0.0–5.0)
Cholesterol, Total: 136 mg/dL (ref 100–199)
HDL: 30 mg/dL — ABNORMAL LOW (ref >39)
LDL Chol Calc (NIH): 74 mg/dL (ref 0–99)
Triglycerides: 187 mg/dL — ABNORMAL HIGH (ref 0–149)
VLDL Cholesterol Cal: 32 mg/dL (ref 5–40)

## 2023-08-06 LAB — CBC
Hematocrit: 44.4 % (ref 37.5–51.0)
Hemoglobin: 14.9 g/dL (ref 13.0–17.7)
MCH: 33 pg (ref 26.6–33.0)
MCHC: 33.6 g/dL (ref 31.5–35.7)
MCV: 98 fL — ABNORMAL HIGH (ref 79–97)
Platelets: 270 10*3/uL (ref 150–450)
RBC: 4.51 x10E6/uL (ref 4.14–5.80)
RDW: 12.3 % (ref 11.6–15.4)
WBC: 6.6 10*3/uL (ref 3.4–10.8)

## 2023-08-06 LAB — LIPOPROTEIN A (LPA): Lipoprotein (a): <8.4 nmol/L (ref <75.0)

## 2023-09-06 ENCOUNTER — Telehealth (HOSPITAL_BASED_OUTPATIENT_CLINIC_OR_DEPARTMENT_OTHER): Payer: Self-pay

## 2023-09-06 NOTE — Telephone Encounter (Signed)
-----   Message from [facility] sent at 09/05/2023  7:57 PM EST ----- Apologies, I didn't realize he didn't have mychart, so I don't think he saw his results. Blood counts are normalized, which is great. Kidney function is stable. Cholesterol continues to improve--his LDL (bad cholesterol) is 74, which is very close to his goal of <70. His triglycerides are also improving, down from a peak of 718 to 187 and closer to our goal of <150. Options are to stay on current dose of atorvastatin and fenofibrate and work hard on healthy diet/exercise, or to increase atorvastatin to 80 mg and recheck lipids in 2-3 months. His lp(a) is normal, which means he doesn't have that as an elevated risk, which is good. I know his PET scan is pending for April, but if he has any worsening symptoms, he should let us know in the interim.

## 2023-09-06 NOTE — Telephone Encounter (Signed)
 Called and spoke to patient; dicussed lab results and MD's comments. Patient in agreement to work on exercise and dieting. Pt also mentioned PET scan has been scheduled and symptoms went away about 3 weeks ago but will proceed with PET scan.

## 2023-10-24 ENCOUNTER — Telehealth (HOSPITAL_COMMUNITY): Payer: Self-pay | Admitting: *Deleted

## 2023-10-24 NOTE — Telephone Encounter (Signed)
 Reaching out to patient to offer assistance regarding upcoming cardiac imaging study; pt verbalizes understanding of appt date/time, parking situation and where to check in, pre-test NPO status and medications ordered, and verified current allergies; name and call back number provided for further questions should they arise Jesse Frame RN Navigator Cardiac Imaging [facility] [phone] office [phone] cell  Patient aware to avoid caffeine for 12 hours prior to test.

## 2023-10-25 ENCOUNTER — Ambulatory Visit: Admission: RE | Admit: 2023-10-25 | Source: Ambulatory Visit

## 2023-11-28 ENCOUNTER — Telehealth (HOSPITAL_COMMUNITY): Payer: Self-pay | Admitting: *Deleted

## 2023-11-28 NOTE — Telephone Encounter (Signed)
 Reaching out to patient to offer assistance regarding upcoming cardiac imaging study; pt verbalizes understanding of appt date/time, parking situation and where to check in, pre-test NPO status and verified current allergies; name and call back number provided for further questions should they arise  Larey Brick RN Navigator Cardiac Imaging [facility] [phone] office [phone] cell  Patient aware to avoid caffeine 12 hours prior to his cardiac PET scan.

## 2023-11-29 ENCOUNTER — Ambulatory Visit
Admission: RE | Admit: 2023-11-29 | Discharge: 2023-11-29 | Disposition: A | Discharge status: Home / Self Care | Source: Ambulatory Visit | Attending: Cardiology | Admitting: Cardiology

## 2023-11-29 DIAGNOSIS — R072 Precordial pain: Secondary | ICD-10-CM

## 2023-11-29 DIAGNOSIS — I2510 Atherosclerotic heart disease of native coronary artery without angina pectoris: Secondary | ICD-10-CM

## 2023-12-05 ENCOUNTER — Telehealth (HOSPITAL_COMMUNITY): Payer: Self-pay | Admitting: *Deleted

## 2023-12-05 NOTE — Telephone Encounter (Signed)
 Reaching out to patient to offer assistance regarding upcoming cardiac imaging study; pt verbalizes understanding of appt date/time, parking situation and where to check in, pre-test NPO status, and verified current allergies; name and call back number provided for further questions should they arise  Chase Copping RN Navigator Cardiac Imaging [facility] [phone] office [phone] cell  Patient aware to avoid caffeine 12 hours prior to PET study.

## 2023-12-06 ENCOUNTER — Ambulatory Visit: Admission: RE | Admit: 2023-12-06 | Source: Ambulatory Visit

## 2023-12-06 ENCOUNTER — Ambulatory Visit
Admission: RE | Admit: 2023-12-06 | Discharge: 2023-12-06 | Disposition: A | Discharge status: Home / Self Care | Source: Ambulatory Visit | Attending: Cardiology | Admitting: Cardiology

## 2023-12-06 DIAGNOSIS — R072 Precordial pain: Secondary | ICD-10-CM

## 2023-12-06 DIAGNOSIS — I2510 Atherosclerotic heart disease of native coronary artery without angina pectoris: Secondary | ICD-10-CM | POA: Diagnosis not present

## 2023-12-06 DIAGNOSIS — R918 Other nonspecific abnormal finding of lung field: Secondary | ICD-10-CM | POA: Diagnosis not present

## 2023-12-06 LAB — NM PET CT CARDIAC PERFUSION MULTI W/ABSOLUTE BLOODFLOW
LV dias vol: 109 mL (ref 62–150)
LV sys vol: 36 mL
MBFR: 2.25
Nuc Rest EF: 65 %
Nuc Stress EF: 67 %
Peak HR: 84 {beats}/min
Rest HR: 70 {beats}/min
Rest MBF: 0.69 ml/g/min
Rest Nuclear Isotope Dose: 25 mCi
SRS: 0
SSS: 0
ST Depression (mm): 0 mm
Stress MBF: 1.55 ml/g/min
Stress Nuclear Isotope Dose: 25.1 mCi
TID: 1.02

## 2023-12-06 MED ORDER — REGADENOSON 0.4 MG/5ML IV SOLN
0.4000 mg | Freq: Once | INTRAVENOUS | Status: AC
  Administered 2023-12-06: 0.4 mg via INTRAVENOUS
  Filled 2023-12-06: qty 5

## 2023-12-06 MED ORDER — REGADENOSON 0.4 MG/5ML IV SOLN
INTRAVENOUS | Status: AC
  Filled 2023-12-06: qty 5

## 2023-12-06 MED ORDER — RUBIDIUM RB82 GENERATOR (RUBYFILL)
25.0000 | PACK | Freq: Once | INTRAVENOUS | Status: AC
  Administered 2023-12-06: 25 via INTRAVENOUS

## 2023-12-06 MED ORDER — RUBIDIUM RB82 GENERATOR (RUBYFILL)
25.0000 | PACK | Freq: Once | INTRAVENOUS | Status: AC
Start: 2023-12-06 — End: 2023-12-06
  Administered 2023-12-06: 25.06 via INTRAVENOUS

## 2023-12-14 ENCOUNTER — Ambulatory Visit (HOSPITAL_BASED_OUTPATIENT_CLINIC_OR_DEPARTMENT_OTHER): Payer: Self-pay | Admitting: Cardiology

## 2023-12-14 NOTE — Telephone Encounter (Signed)
 Called and left voice message to call back

## 2023-12-14 NOTE — Telephone Encounter (Signed)
-----   Message from [facility] sent at 12/14/2023 11:11 AM EDT ----- Cardiac PET scan very reassuring, no evidence of low blood flow to the heart. ----- Message ----- From: Interface, Rad Results In Sent: 12/06/2023   2:31 PM EDT To: Sheryle Donning, MD

## 2023-12-17 NOTE — Telephone Encounter (Signed)
 Called and spoke to pt. Full name and DOB verified. Pt aware of PET results and was thankful for call.  ----- Message from Sheryle Donning sent at 12/14/2023 11:11 AM EDT ----- Cardiac PET scan very reassuring, no evidence of low blood flow to the heart. ----- Message ----- From: Interface, Rad Results In Sent: 12/06/2023   2:31 PM EDT To: Sheryle Donning, MD

## 2023-12-17 NOTE — Telephone Encounter (Signed)
-----   Message from [facility] sent at 12/14/2023 11:11 AM EDT ----- Cardiac PET scan very reassuring, no evidence of low blood flow to the heart. ----- Message ----- From: Interface, Rad Results In Sent: 12/06/2023   2:31 PM EDT To: Sheryle Donning, MD

## 2024-01-22 ENCOUNTER — Other Ambulatory Visit (HOSPITAL_COMMUNITY): Payer: Self-pay | Admitting: Internal Medicine

## 2024-01-22 DIAGNOSIS — M5116 Intervertebral disc disorders with radiculopathy, lumbar region: Secondary | ICD-10-CM

## 2024-01-25 ENCOUNTER — Ambulatory Visit (HOSPITAL_COMMUNITY)
Admission: RE | Admit: 2024-01-25 | Discharge: 2024-01-25 | Disposition: A | Discharge status: Home / Self Care | Source: Ambulatory Visit | Attending: Internal Medicine | Admitting: Internal Medicine

## 2024-01-25 DIAGNOSIS — M5116 Intervertebral disc disorders with radiculopathy, lumbar region: Secondary | ICD-10-CM | POA: Diagnosis present

## 2024-05-14 ENCOUNTER — Other Ambulatory Visit: Payer: Self-pay | Admitting: Cardiology

## 2024-06-02 ENCOUNTER — Other Ambulatory Visit: Payer: Self-pay | Admitting: Cardiology

## 2024-06-07 ENCOUNTER — Other Ambulatory Visit (HOSPITAL_BASED_OUTPATIENT_CLINIC_OR_DEPARTMENT_OTHER): Payer: Self-pay | Admitting: Cardiology

## 2024-06-07 DIAGNOSIS — I2510 Atherosclerotic heart disease of native coronary artery without angina pectoris: Secondary | ICD-10-CM

## 2024-06-07 DIAGNOSIS — R072 Precordial pain: Secondary | ICD-10-CM
